# Patient Record
Sex: Male | Born: 1970
Health system: Southern US, Community
[De-identification: ages and names within clinical notes are randomized; demographics above are authoritative.]

## PROBLEM LIST (undated history)

## (undated) DIAGNOSIS — N390 Urinary tract infection, site not specified: Secondary | ICD-10-CM

## (undated) DIAGNOSIS — N419 Inflammatory disease of prostate, unspecified: Secondary | ICD-10-CM

## (undated) DIAGNOSIS — I7121 Aneurysm of the ascending aorta, without rupture: Secondary | ICD-10-CM

## (undated) DIAGNOSIS — I712 Thoracic aortic aneurysm, without rupture: Secondary | ICD-10-CM

## (undated) DIAGNOSIS — G4730 Sleep apnea, unspecified: Secondary | ICD-10-CM

## (undated) DIAGNOSIS — G459 Transient cerebral ischemic attack, unspecified: Secondary | ICD-10-CM

## (undated) DIAGNOSIS — I10 Essential (primary) hypertension: Secondary | ICD-10-CM

## (undated) DIAGNOSIS — E119 Type 2 diabetes mellitus without complications: Secondary | ICD-10-CM

## (undated) HISTORY — DX: Morbid (severe) obesity due to excess calories: E66.01

## (undated) HISTORY — DX: Urinary tract infection, site not specified: N39.0

## (undated) HISTORY — PX: HERNIA REPAIR: SHX51

## (undated) HISTORY — PX: WISDOM TOOTH EXTRACTION: SHX21

## (undated) HISTORY — PX: OTHER SURGICAL HISTORY: SHX169

## (undated) HISTORY — DX: Inflammatory disease of prostate, unspecified: N41.9

## (undated) HISTORY — DX: Essential (primary) hypertension: I10

---

## 2007-06-30 ENCOUNTER — Emergency Department (HOSPITAL_COMMUNITY): Admission: EM | Admit: 2007-06-30 | Discharge: 2007-06-30 | Payer: Self-pay | Admitting: Emergency Medicine

## 2007-10-17 ENCOUNTER — Emergency Department (HOSPITAL_COMMUNITY): Admission: EM | Admit: 2007-10-17 | Discharge: 2007-10-17 | Payer: Self-pay | Admitting: Emergency Medicine

## 2008-06-12 ENCOUNTER — Ambulatory Visit (HOSPITAL_COMMUNITY): Admission: RE | Admit: 2008-06-12 | Discharge: 2008-06-13 | Payer: Self-pay | Admitting: General Surgery

## 2008-06-12 ENCOUNTER — Encounter (HOSPITAL_BASED_OUTPATIENT_CLINIC_OR_DEPARTMENT_OTHER): Payer: Self-pay | Admitting: General Surgery

## 2008-12-01 ENCOUNTER — Emergency Department (HOSPITAL_COMMUNITY): Admission: EM | Admit: 2008-12-01 | Discharge: 2008-12-01 | Payer: Self-pay | Admitting: Family Medicine

## 2008-12-18 ENCOUNTER — Emergency Department (HOSPITAL_BASED_OUTPATIENT_CLINIC_OR_DEPARTMENT_OTHER): Admission: EM | Admit: 2008-12-18 | Discharge: 2008-12-18 | Payer: Self-pay | Admitting: Emergency Medicine

## 2009-01-17 ENCOUNTER — Encounter: Admission: RE | Admit: 2009-01-17 | Discharge: 2009-01-17 | Payer: Self-pay | Admitting: Orthopedic Surgery

## 2009-05-01 ENCOUNTER — Emergency Department (HOSPITAL_COMMUNITY): Admission: EM | Admit: 2009-05-01 | Discharge: 2009-05-01 | Payer: Self-pay | Admitting: Family Medicine

## 2009-08-29 ENCOUNTER — Emergency Department (HOSPITAL_COMMUNITY): Admission: EM | Admit: 2009-08-29 | Discharge: 2009-08-29 | Payer: Self-pay | Admitting: Emergency Medicine

## 2009-09-23 ENCOUNTER — Emergency Department (HOSPITAL_COMMUNITY): Admission: EM | Admit: 2009-09-23 | Discharge: 2009-09-23 | Payer: Self-pay | Admitting: Emergency Medicine

## 2009-10-18 ENCOUNTER — Encounter (INDEPENDENT_AMBULATORY_CARE_PROVIDER_SITE_OTHER): Payer: Self-pay | Admitting: *Deleted

## 2009-10-18 ENCOUNTER — Ambulatory Visit (HOSPITAL_COMMUNITY): Admission: RE | Admit: 2009-10-18 | Discharge: 2009-10-18 | Payer: Self-pay | Admitting: Urology

## 2009-11-13 ENCOUNTER — Emergency Department (HOSPITAL_COMMUNITY): Admission: EM | Admit: 2009-11-13 | Discharge: 2009-11-13 | Payer: Self-pay | Admitting: Family Medicine

## 2010-05-22 ENCOUNTER — Emergency Department (HOSPITAL_COMMUNITY): Admission: EM | Admit: 2010-05-22 | Discharge: 2010-05-22 | Payer: Self-pay | Admitting: Emergency Medicine

## 2010-06-10 ENCOUNTER — Ambulatory Visit: Payer: Self-pay | Admitting: Internal Medicine

## 2010-06-10 ENCOUNTER — Encounter: Payer: Self-pay | Admitting: Internal Medicine

## 2010-06-10 DIAGNOSIS — N410 Acute prostatitis: Secondary | ICD-10-CM | POA: Insufficient documentation

## 2010-06-10 DIAGNOSIS — K921 Melena: Secondary | ICD-10-CM | POA: Insufficient documentation

## 2010-06-10 DIAGNOSIS — E119 Type 2 diabetes mellitus without complications: Secondary | ICD-10-CM | POA: Insufficient documentation

## 2010-06-10 DIAGNOSIS — I10 Essential (primary) hypertension: Secondary | ICD-10-CM | POA: Insufficient documentation

## 2010-06-10 LAB — CONVERTED CEMR LAB
AST: 78 units/L — ABNORMAL HIGH (ref 0–37)
Alkaline Phosphatase: 81 units/L (ref 39–117)
Basophils Absolute: 0.1 10*3/uL (ref 0.0–0.1)
Bilirubin Urine: NEGATIVE
Calcium: 9.5 mg/dL (ref 8.4–10.5)
Cholesterol: 214 mg/dL — ABNORMAL HIGH (ref 0–200)
Creatinine, Ser: 0.9 mg/dL (ref 0.4–1.5)
Direct LDL: 152.4 mg/dL
Eosinophils Absolute: 0.2 10*3/uL (ref 0.0–0.7)
Glucose, Bld: 168 mg/dL — ABNORMAL HIGH (ref 70–99)
HCT: 40.2 % (ref 39.0–52.0)
HDL: 38.9 mg/dL — ABNORMAL LOW (ref >39.00)
MCHC: 33.3 g/dL (ref 30.0–36.0)
MCV: 86.7 fL (ref 78.0–100.0)
Monocytes Absolute: 0.8 10*3/uL (ref 0.1–1.0)
Platelets: 248 10*3/uL (ref 150.0–400.0)
Potassium: 4.1 meq/L (ref 3.5–5.1)
RDW: 13.9 % (ref 11.5–14.6)
Sodium: 137 meq/L (ref 135–145)
Specific Gravity, Urine: >=1.03 (ref 1.000–1.030)
Total Bilirubin: 0.5 mg/dL (ref 0.3–1.2)
Total CHOL/HDL Ratio: 6
Total Protein: 6.8 g/dL (ref 6.0–8.3)
VLDL: 28.4 mg/dL (ref 0.0–40.0)
pH: 5 (ref 5.0–8.0)

## 2010-06-11 ENCOUNTER — Encounter: Payer: Self-pay | Admitting: Internal Medicine

## 2010-06-14 ENCOUNTER — Ambulatory Visit: Payer: Self-pay | Admitting: Gastroenterology

## 2010-06-14 ENCOUNTER — Encounter (INDEPENDENT_AMBULATORY_CARE_PROVIDER_SITE_OTHER): Payer: Self-pay | Admitting: *Deleted

## 2010-07-18 ENCOUNTER — Encounter: Payer: Self-pay | Admitting: Gastroenterology

## 2010-07-18 ENCOUNTER — Ambulatory Visit (HOSPITAL_COMMUNITY)
Admission: RE | Admit: 2010-07-18 | Discharge: 2010-07-18 | Payer: Self-pay | Source: Home / Self Care | Attending: Gastroenterology | Admitting: Gastroenterology

## 2010-08-29 NOTE — Procedures (Signed)
Summary: Instructions for procedure/[place]  Instructions for procedure/[place]   Imported By: Sherian Rein 06/19/2010 10:17:08  _____________________________________________________________________  External Attachment:    Type:   Image     Comment:   External Document

## 2010-08-29 NOTE — Assessment & Plan Note (Signed)
Summary: New / [facility] / # cd   Vital Signs:  Patient profile:   40 year old male Height:      77 inches Weight:      423 pounds BMI:     50.34 O2 Sat:      97 % on Room air Temp:     97.8 degrees F oral Pulse rate:   81 / minute Pulse rhythm:   regular Resp:     16 per minute BP sitting:   142 / 94  (left arm) Cuff size:   large  Vitals Entered By: Rock Nephew CMA (June 10, 2010 4:07 PM)  Nutrition Counseling: Patient's BMI is greater than 25 and therefore counseled on weight management options.  O2 Flow:  Room air CC: new to establish Is Patient Diabetic? No Pain Assessment Patient in pain? no       Does patient need assistance? Functional Status Self care Ambulation Normal   Primary Care Provider:  Etta Grandchild MD  CC:  new to establish.  History of Present Illness: New to me he has been seen at an [facility] recently but no consistent primary care. He has had intermittent painless rectal bleeding for several months. He had a cystoscopy in April with Dr. Laverle Patter for prostatitis.  Preventive Screening-Counseling & Management  Alcohol-Tobacco     Alcohol drinks/day: <1     Alcohol type: beer     >5/day in last 3 mos: no     Alcohol Counseling: not indicated; use of alcohol is not excessive or problematic     Feels need to cut down: no     Feels annoyed by complaints: no     Feels guilty re: drinking: no     Needs 'eye opener' in am: no     Smoking Status: never     Tobacco Counseling: not indicated; no tobacco use  Caffeine-Diet-Exercise     Does Patient Exercise: yes  Hep-HIV-STD-Contraception     Hepatitis Risk: no risk noted     HIV Risk: no risk noted     STD Risk: no risk noted     Dental Visit-last 6 months yes     TSE monthly: yes      Sexual History:  currently monogamous.        Drug Use:  no.        Blood Transfusions:  no.    Clinical Review Panels:  Immunizations   Last Tetanus Booster:  Tdap (06/10/2010)   Last Flu  Vaccine:  Fluvax 3+ (06/10/2010)  Diabetes Management   Last Flu Vaccine:  Fluvax 3+ (06/10/2010)   Current Medications (verified): 1)  None  Allergies (verified): No Known Drug Allergies  Past History:  Past Medical History: Diabetes mellitus, type II Hypertension Prostatitis  Past Surgical History: Hemorrhoidectomy  Family History: Family History of Arthritis Family History Breast cancer 1st degree relative <50 Family History of Colon CA 1st degree relative <60 Family History Diabetes 1st degree relative Family History High cholesterol Family History Hypertension  Social History: Occupation: Geneticist, molecular Married Never Smoked Alcohol use-yes Drug use-no Regular exercise-yes Smoking Status:  never Drug Use:  no Does Patient Exercise:  yes Hepatitis Risk:  no risk noted HIV Risk:  no risk noted STD Risk:  no risk noted Dental Care w/in 6 mos.:  yes Sexual History:  currently monogamous Blood Transfusions:  no  Review of Systems       The patient complains of hematochezia.  The  patient denies anorexia, fever, weight loss, weight gain, chest pain, syncope, dyspnea on exertion, peripheral edema, prolonged cough, headaches, hemoptysis, abdominal pain, melena, severe indigestion/heartburn, hematuria, muscle weakness, suspicious skin lesions, difficulty walking, depression, enlarged lymph nodes, angioedema, and testicular masses.   GI:  Complains of bloody stools; denies abdominal pain, change in bowel habits, constipation, dark tarry stools, diarrhea, hemorrhoids, indigestion, loss of appetite, nausea, vomiting, vomiting blood, and yellowish skin color. GU:  Denies decreased libido, discharge, dysuria, erectile dysfunction, hematuria, nocturia, urinary frequency, and urinary hesitancy. Endo:  Complains of polyuria; denies cold intolerance, excessive hunger, excessive thirst, excessive urination, heat intolerance, and weight change.  Physical  Exam  General:  alert, well-developed, well-nourished, well-hydrated, appropriate dress, normal appearance, healthy-appearing, cooperative to examination, good hygiene, and overweight-appearing.   Head:  normocephalic, atraumatic, no abnormalities observed, and no abnormalities palpated.   Eyes:  vision grossly intact, pupils equal, pupils round, and pupils reactive to light.   Ears:  R ear normal and L ear normal.   Nose:  External nasal examination shows no deformity or inflammation. Nasal mucosa are pink and moist without lesions or exudates. Mouth:  Oral mucosa and oropharynx without lesions or exudates.  Teeth in good repair. Neck:  supple, full ROM, no masses, no thyromegaly, no thyroid nodules or tenderness, no JVD, normal carotid upstroke, no carotid bruits, no cervical lymphadenopathy, and no neck tenderness.   Lungs:  normal respiratory effort, no intercostal retractions, no accessory muscle use, normal breath sounds, no dullness, no fremitus, no crackles, and no wheezes.   Heart:  normal rate, regular rhythm, no murmur, no gallop, no rub, and no JVD.   Abdomen:  soft, non-tender, normal bowel sounds, no distention, no masses, no guarding, no rigidity, no rebound tenderness, no abdominal hernia, no inguinal hernia, no hepatomegaly, and no splenomegaly.   Rectal:  no external abnormalities, normal sphincter tone, no masses, no tenderness, no fissures, no fistulae, no perianal rash, stool positive for occult blood, and internal hemorrhoid(s).   Genitalia:  uncircumcised, no hydrocele, no varicocele, no scrotal masses, no testicular masses or atrophy, no cutaneous lesions, and no urethral discharge.   Prostate:  Prostate gland firm and smooth, no enlargement, nodularity, tenderness, mass, asymmetry or induration. Skin:  body piercing(s)-tongue. turgor normal, color normal, no rashes, no suspicious lesions, no ecchymoses, no petechiae, no purpura, no ulcerations, no edema, and body piercing(s).    Cervical Nodes:  no anterior cervical adenopathy and no posterior cervical adenopathy.   Axillary Nodes:  no R axillary adenopathy and no L axillary adenopathy.   Inguinal Nodes:  no R inguinal adenopathy and no L inguinal adenopathy.   Psych:  Oriented X3, memory intact for recent and remote, normally interactive, good eye contact, not anxious appearing, not depressed appearing, not agitated, not suicidal, and not homicidal.     Impression & Recommendations:  Problem # 1:  OBESITY, MORBID (ICD-278.01) Assessment New  Ht: 77 (06/10/2010)   Wt: 423 (06/10/2010)   BMI: 50.34 (06/10/2010)  Problem # 2:  BLOOD IN STOOL (ICD-578.1) Assessment: New with his family hx. he may need a colonoscopy Orders: Gastroenterology Referral (GI) Hemoccult Guaiac-1 spec.(in office) (82270)  Problem # 3:  PROSTATITIS, ACUTE (ICD-601.0) Assessment: Improved  Orders: Venipuncture ([address]) TLB-Lipid Panel (80061-LIPID) TLB-BMP (Basic Metabolic Panel-BMET) (80048-METABOL) TLB-CBC Platelet - w/Differential (85025-CBCD) TLB-Hepatic/Liver Function Pnl (80076-HEPATIC) TLB-TSH (Thyroid Stimulating Hormone) (84443-TSH) TLB-A1C / Hgb A1C (Glycohemoglobin) (83036-A1C) TLB-PSA (Prostate Specific Antigen) (84153-PSA) TLB-Udip w/ Micro (81001-URINE)  Problem # 4:  HYPERTENSION (ICD-401.9) Assessment:  Deteriorated he is not willing to take a med at this time Orders: Venipuncture ([address]) TLB-Lipid Panel (80061-LIPID) TLB-BMP (Basic Metabolic Panel-BMET) (80048-METABOL) TLB-CBC Platelet - w/Differential (85025-CBCD) TLB-Hepatic/Liver Function Pnl (80076-HEPATIC) TLB-TSH (Thyroid Stimulating Hormone) (84443-TSH) TLB-A1C / Hgb A1C (Glycohemoglobin) (83036-A1C) TLB-PSA (Prostate Specific Antigen) (84153-PSA)  BP today: 142/94  Problem # 5:  DIABETES MELLITUS, TYPE II (ICD-250.00) Assessment: Unchanged  Orders: Venipuncture (60[address]) TLB-Lipid Panel (80061-LIPID) TLB-BMP (Basic Metabolic Panel-BMET)  (80048-METABOL) TLB-CBC Platelet - w/Differential (85025-CBCD) TLB-Hepatic/Liver Function Pnl (80076-HEPATIC) TLB-TSH (Thyroid Stimulating Hormone) (84443-TSH) TLB-A1C / Hgb A1C (Glycohemoglobin) (83036-A1C) TLB-PSA (Prostate Specific Antigen) (84153-PSA)  Other Orders: Tdap => 2yrs IM (09811) Admin of Any Addtl Vaccine (91478) Future Orders: Admin 1st Vaccine (29562) ... 06/11/2010 Flu Vaccine 92yrs + (13086) ... 06/11/2010  Patient Instructions: 1)  Please schedule a follow-up appointment in 1 month. 2)  It is important that you exercise regularly at least 20 minutes 5 times a week. If you develop chest pain, have severe difficulty breathing, or feel very tired , stop exercising immediately and seek medical attention. 3)  You need to lose weight. Consider a lower calorie diet and regular exercise.  4)  Schedule a colonoscopy/sigmoidoscopy to help detect colon cancer. 5)  Check your Blood Pressure regularly. If it is above 140/90: you should make an appointment.   Orders Added: 1)  Venipuncture [36415] 2)  TLB-Lipid Panel [80061-LIPID] 3)  TLB-BMP (Basic Metabolic Panel-BMET) [80048-METABOL] 4)  TLB-CBC Platelet - w/Differential [85025-CBCD] 5)  TLB-Hepatic/Liver Function Pnl [80076-HEPATIC] 6)  TLB-TSH (Thyroid Stimulating Hormone) [84443-TSH] 7)  TLB-A1C / Hgb A1C (Glycohemoglobin) [83036-A1C] 8)  TLB-PSA (Prostate Specific Antigen) [84153-PSA] 9)  TLB-Udip w/ Micro [81001-URINE] 10)  Gastroenterology Referral [GI] 11)  Admin 1st Vaccine [90471] 12)  Flu Vaccine 47yrs + [57846] 13)  Tdap => 66yrs IM [90715] 14)  Admin of Any Addtl Vaccine [90472] 15)  Hemoccult Guaiac-1 spec.(in office) [82270] 16)  New Patient Level V [99205] Flu Vaccine Consent Questions     Do you have a history of severe allergic reactions to this vaccine? no    Any prior history of allergic reactions to egg and/or gelatin? no    Do you have a sensitivity to the preservative Thimersol? no    Do you have  a past history of Guillan-Barre Syndrome? no    Do you currently have an acute febrile illness? no    Have you ever had a severe reaction to latex? no    Vaccine information given and explained to patient? yes    Are you currently pregnant? no    Lot Number:[id number]   Exp Date:01/25/2011   Site Given  Left Deltoid IM 3)  TLB-BMP (Basic Metabolic Panel-BMET) [80048-METABOL] 4)  TLB-CBC Platelet - w/Differential [85025-CBCD] 5)  TLB-Hepatic/Liver Function Pnl [80076-HEPATIC] 6)  TLB-TSH (Thyroid Stimulating Hormone) [84443-TSH] 7)  TLB-A1C / Hgb A1C (Glycohemoglobin) [83036-A1C] 8)  TLB-PSA (Prostate Specific Antigen) [84153-PSA] 9)  TLB-Udip w/ Micro [81001-URINE] 10)  Gastroenterology Referral [GI] 11)  Admin 1st Vaccine [90471] 12)  Flu Vaccine 15yrs + [96295]   Immunizations Administered:  Tetanus Vaccine:    Vaccine Type: Tdap    Site: right deltoid    Mfr: GlaxoSmithKline    Dose: 0.5 ml    Route: IM    Given by: Rock Nephew CMA    Exp. Date: 05/16/2012    Lot #: [id number]    VIS given: 06/14/08 version given June 11, 2010.   Immunizations Administered:  Tetanus Vaccine:    Vaccine Type:  Tdap    Site: right deltoid    Mfr: GlaxoSmithKline    Dose: 0.5 ml    Route: IM    Given by: Rock Nephew CMA    Exp. Date: 05/16/2012    Lot #: [id number]    VIS given: 06/14/08 version given June 11, 2010.   Marland Kitchenlbflu1

## 2010-08-29 NOTE — Letter (Signed)
Summary: Lipid Letter  [facility]  [address] [place], [place] [address]   Phone: [phone]  Fax: [phone]    06/11/2010  Stephen Dunlap [address] [place], [place]  [address]  Dear [name]:  We have carefully reviewed your last lipid profile from  and the results are noted below with a summary of recommendations for lipid management.    Cholesterol:       214     Goal: <200   HDL "good" Cholesterol:   95.28     Goal: >40   LDL "bad" Cholesterol:   152     Goal: <100   Triglycerides:       142.0     Goal: <150        TLC Diet (Therapeutic Lifestyle Change): Saturated Fats & Transfatty acids should be kept < 7% of total calories ***Reduce Saturated Fats Polyunstaurated Fat can be up to 10% of total calories Monounsaturated Fat Fat can be up to 20% of total calories Total Fat should be no greater than 25-35% of total calories Carbohydrates should be 50-60% of total calories Protein should be approximately 15% of total calories Fiber should be at least 20-30 grams a day ***Increased fiber may help lower LDL Total Cholesterol should be < 200mg /day Consider adding plant stanol/sterols to diet (example: Benacol spread) ***A higher intake of unsaturated fat may reduce Triglycerides and Increase HDL    Adjunctive Measures (may lower LIPIDS and reduce risk of Heart Attack) include: Aerobic Exercise (20-30 minutes 3-4 times a week) Limit Alcohol Consumption Weight Reduction Aspirin 75-81 mg a day by mouth (if not allergic or contraindicated) Dietary Fiber 20-30 grams a day by mouth     Current Medications:  None If you have any questions, please call. We appreciate being able to work with you.   Sincerely,    [facility] Etta Grandchild MD

## 2010-08-29 NOTE — Letter (Signed)
Summary: New Patient letter  [facility]  [address] [place], [place] [address]   Phone: [phone]  Fax: [phone]       06/10/2010 MRN: [id number]  Stephen Dunlap [address] [place], [place]  [address]  Dear Stephen Dunlap,  Welcome to the Gastroenterology Division at [facility].    You are scheduled to see Dr.  Marina Goodell on 06-17-10 at 3:30pm on the 3rd floor at [facility], [address]  We ask that you try to arrive at our office 15 minutes prior to your appointment time to allow for check-in.  We would like you to complete the enclosed self-administered evaluation form prior to your visit and bring it with you on the day of your appointment.  We will review it with you.  Also, please bring a complete list of all your medications or, if you prefer, bring the medication bottles and we will list them.  Please bring your insurance card so that we may make a copy of it.  If your insurance requires a referral to see a specialist, please bring your referral form from your primary care physician.  Co-payments are due at the time of your visit and may be paid by cash, check or credit card.     Your office visit will consist of a consult with your physician (includes a physical exam), any laboratory testing he/she may order, scheduling of any necessary diagnostic testing (e.g. x-ray, ultrasound, CT-scan), and scheduling of a procedure (e.g. Endoscopy, Colonoscopy) if required.  Please allow enough time on your schedule to allow for any/all of these possibilities.    If you cannot keep your appointment, please call [phone] to cancel or reschedule prior to your appointment date.  This allows [place] the opportunity to schedule an appointment for another patient in need of care.  If you do not cancel or reschedule by 5 p.m. the business day prior to your appointment date, you will be charged a $50.00 late cancellation/no-show fee.    Thank you for choosing  [facility] for your medical needs.  We appreciate the opportunity to care for you.  Please visit [place] at our website  to learn more about our practice.                     Sincerely,                                                             The Gastroenterology Division

## 2010-08-29 NOTE — Letter (Signed)
Summary: [facility] Instructions  [facility]  [address] [place], [place] [address]   Phone: [phone]  Fax: [phone]       Stephen Dunlap    Feb 05, 1971    MRN: [id number]        Procedure Day /Date:07/18/10 THURS     Arrival Time:600 am     Procedure Time:800 am     Location of Procedure:                     X  [facility] ( Outpatient Registration)                        PREPARATION FOR COLONOSCOPY WITH MOVIPREP   Starting 5 days prior to your procedure 07/13/10  do not eat nuts, seeds, popcorn, corn, beans, peas,  salads, or any raw vegetables.  Do not take any fiber supplements (e.g. Metamucil, Citrucel, and Benefiber).  THE DAY BEFORE YOUR PROCEDURE         DATE:07/17/10  DAY:WED  1.  Drink clear liquids the entire day-NO SOLID FOOD  2.  Do not drink anything colored red or purple.  Avoid juices with pulp.  No orange juice.  3.  Drink at least 64 oz. (8 glasses) of fluid/clear liquids during the day to prevent dehydration and help the prep work efficiently.  CLEAR LIQUIDS INCLUDE: Water Jello Ice Popsicles Tea (sugar ok, no milk/cream) Powdered fruit flavored drinks Coffee (sugar ok, no milk/cream) Gatorade Juice: apple, white grape, white cranberry  Lemonade Clear bullion, consomm, broth Carbonated beverages (any kind) Strained chicken noodle soup Hard Candy                             4.  In the morning, mix first dose of MoviPrep solution:    Empty 1 Pouch A and 1 Pouch B into the disposable container    Add lukewarm drinking water to the top line of the container. Mix to dissolve    Refrigerate (mixed solution should be used within 24 hrs)  5.  Begin drinking the prep at 5:00 p.m. The MoviPrep container is divided by 4 marks.   Every 15 minutes drink the solution down to the next Mylin (approximately 8 oz) until the full liter is complete.   6.  Follow completed prep with 16 oz of clear liquid of your choice (Nothing  red or purple).  Continue to drink clear liquids until bedtime.  7.  Before going to bed, mix second dose of MoviPrep solution:    Empty 1 Pouch A and 1 Pouch B into the disposable container    Add lukewarm drinking water to the top line of the container. Mix to dissolve    Refrigerate  THE DAY OF YOUR PROCEDURE      DATE: 07/18/10 DAY: Stephen Dunlap  Beginning at 3 a.m. (5 hours before procedure):         1. Every 15 minutes, drink the solution down to the next Deloy (approx 8 oz) until the full liter is complete.  Nothing to eat or drink after midnight (also includes chewing gum)   MEDICATION INSTRUCTIONS  Unless otherwise instructed, you should take regular prescription medications with a small sip of water   as early as possible the morning of your procedure.  Diabetic patients - see separate instructions.            OTHER  INSTRUCTIONS  You will need a responsible adult at least 39 years of age to accompany you and drive you home.   This person must remain in the waiting room during your procedure.  Wear loose fitting clothing that is easily removed.  Leave jewelry and other valuables at home.  However, you may wish to bring a book to read or  an iPod/MP3 player to listen to music as you wait for your procedure to start.  Remove all body piercing jewelry and leave at home.  Total time from sign-in until discharge is approximately 2-3 hours.  You should go home directly after your procedure and rest.  You can resume normal activities the  day after your procedure.  The day of your procedure you should not:   Drive   Make legal decisions   Operate machinery   Drink alcohol   Return to work  You will receive specific instructions about eating, activities and medications before you leave.    The above instructions have been reviewed and explained to me by   _______________________    I fully understand and can verbalize these instructions  _____________________________ Date _________

## 2010-08-29 NOTE — Letter (Signed)
Summary: Results Follow-up Letter  [facility]  [address] [place], [place] [address]   Phone: [phone]  Fax: [phone]    06/11/2010  [address] [place], [place]  [address]  Dear Stephen Dunlap,   The following are the results of your recent test(s):  Test     Result     Blood sugars   high, you need meds for diabetes Liver       very slight enzyme elevation Kidney     normal Thyroid     normal Urine       trace of infection CBC       normal Prostate     normal   _________________________________________________________  Please call for an appointment soon _________________________________________________________ _________________________________________________________ _________________________________________________________  Sincerely,  Sanda Linger MD [facility]

## 2010-08-29 NOTE — Letter (Signed)
Summary: Diabetic Instructions  [facility]  [address] [place], [place] [address]   Phone: [phone]  Fax: [phone]    Stephen Dunlap [birth year] MRN: [id number]   X   ORAL DIABETIC MEDICATION INSTRUCTIONS  The day before your procedure:   Take your diabetic pill as you do normally  The day of your procedure:   Do not take your diabetic pill    We will check your blood sugar levels during the admission process and again in Recovery before discharging you home  ________________________________________________________________________  X   INSULIN (LONG ACTING) MEDICATION INSTRUCTIONS (Lantus, NPH, 70/30, Humulin, Novolin-N)   The day before your procedure:   Take  your regular evening dose    The day of your procedure:   Do not take your morning dose    X   INSULIN (SHORT ACTING) MEDICATION INSTRUCTIONS (Regular, Humulog, Novolog)   The day before your procedure:   Do not take your evening dose   The day of your procedure:   Do not take your morning dose   Please arrive at [facility] at 1:30 pm on 07/17/10 for pre appointment

## 2010-08-29 NOTE — Procedures (Signed)
Summary: Colonoscopy  Patient: Stephen Dunlap Note: All result statuses are Final unless otherwise noted.  Tests: (1) Colonoscopy (COL)   COL Colonoscopy           DONE     [facility]     [address] [place], [place]  [address]           COLONOSCOPY PROCEDURE REPORT           PATIENT:  Stephen Dunlap, Stephen Dunlap  MR#:  [id number]     BIRTHDATE:  12/25/1970, 39 yrs. old  GENDER:  male     ENDOSCOPIST:  Rachael Fee, MD     REF. BY:  Etta Grandchild, M.D.     PROCEDURE DATE:  07/18/2010     PROCEDURE:  Diagnostic Colonoscopy     ASA CLASS:  Class II     INDICATIONS:  minor intermittent rectal bleeding     MEDICATIONS:  MAC sedation, administered by CRNA           DESCRIPTION OF PROCEDURE:   After the risks benefits and     alternatives of the procedure were thoroughly explained, informed     consent was obtained.  No rectal exam performed. The EC-[id number]     ([id number]) endoscope was introduced through the anus and advanced     to the cecum, which was identified by both the appendix and     ileocecal valve, without limitations.  The quality of the prep was     excellent, using MoviPrep.  The instrument was then slowly     withdrawn as the colon was fully examined.     <<PROCEDUREIMAGES>>     FINDINGS:  Internal and external hemorrhoids were found. These     were small, not thrombosed.  This was otherwise a normal     examination of the colon (see image002, image003, and image005).     Retroflexed views in the rectum revealed no abnormalities.    The     scope was then withdrawn from the patient and the procedure     completed.     COMPLICATIONS:  None           ENDOSCOPIC IMPRESSION:     1)Small internal and external hemorrhoids     2) Otherwise normal examination; no polyps or cancers           RECOMMENDATIONS:     1) You should continue to follow colorectal cancer screening     guidelines for "routine risk" patients with a repeat colonoscopy     in 10 years. There is  no need for FOBT (stool) testing for at     least 5 years.           REPEAT EXAM:  10 years           ______________________________     Rachael Fee, MD           n.     eSIGNED:   Rachael Fee at 07/18/2010 08:47 AM           Lendon Collar, [id number]  Note: An exclamation Cordarro (!) indicates a result that was not dispersed into the flowsheet. Document Creation Date: 07/18/2010 8:47 AM _______________________________________________________________________  (1) Order result status: Final Collection or observation date-time: 07/18/2010 08:39 Requested date-time:  Receipt date-time:  Reported date-time:  Referring Physician:   Ordering Physician: Rob Bunting [phone]) Specimen Source:  Source: Launa Grill Order Number: [id number] Lab  site:   Appended Document: Colonoscopy patty, he needs recall colonoscopy in 10 years  Appended Document: Colonoscopy recall in IDX and EMR   Clinical Lists Changes  Observations: Added new observation of COLONNXTDUE: 04/2020 (07/18/2010 9:27)

## 2010-08-29 NOTE — Assessment & Plan Note (Signed)
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  who has had intermittent rectal bleeding.  Noticed in many years ago. Bright red blood streaking the stool.  Can have minor constipation at times.  he had a CBC last week showing that he is not anemic. He does not have any rectal discomfort.           Current Medications (verified): 1)  None  Allergies (verified): No Known Drug Allergies  Past History:  Past Medical History: Diabetes mellitus, type II Hypertension Prostatitis morbid obesity UTI  Past Surgical History: hernia repair cysto bedside  Family History: Family History of Arthritis Family History Breast cancer 1st degree relative <50 Family History Diabetes 1st degree relative Family History High cholesterol Family History Hypertension  Social History: Occupation: Geneticist, molecular Married Never Smoked Alcohol use-yes Drug use-no Regular exercise-yes three children  Review of Systems       Pertinent positive and negative review of systems were noted in the above HPI and GI specific review of systems.  All other review of systems was otherwise negative.   Vital Signs:  Patient profile:   40 year old male Height:      77 inches Weight:      423 pounds BMI:     50.34 BSA:     3.08 Pulse rate:   86 / minute Pulse rhythm:   regular BP sitting:   142 / 80  (left arm) Cuff size:   large  Vitals Entered By: Ok Anis CMA (June 14, 2010 1:39 PM)  Physical Exam  Additional Exam:  Constitutional: generally well appearing Psychiatric: alert and oriented times 3 Eyes: extraocular movements intact Mouth: oropharynx moist, no lesions Neck: supple, no lymphadenopathy Cardiovascular:  heart regular rate and rythm Lungs: CTA bilaterally Abdomen: soft, non-tender, non-distended, no obvious ascites, no peritoneal signs, normal bowel sounds Extremities: no lower extremity edema bilaterally Skin: no lesions on visible extremities  rectal exam deferred for upcoming colonoscopy   Impression & Recommendations:  Problem # 1:  Intermittent, minor rectal bleeding likely anorectal in origin his aunt had colon cancer. We should proceed with colonoscopy at his soonest convenience. He has been a bit constipated lately and so I recommended a trial of fiber supplement.  Patient Instructions: 1)  You will be scheduled to have a colonoscopy at [facility] hosp with propofol sedation. 2)  You should begin taking citrucel powder fiber supplement (orange flavor).  Start with a small spoonful and increase this over 1 week to a full, heaping spoonful daily.  You may notice some bloating when you first start the fiber, but that usually resolves after a few days. 3)  The medication list was reviewed and reconciled.  All changed / newly prescribed medications were explained.  A complete medication list was provided to the patient / caregiver.  Appended Document: Orders Update/movi    Clinical Lists Changes  Medications: Added new medication of MOVIPREP 100 GM  SOLR (PEG-KCL-NACL-NASULF-NA ASC-C) As per prep instructions. - Signed Rx of MOVIPREP 100 GM  SOLR (PEG-KCL-NACL-NASULF-NA ASC-C) As per prep instructions.;  #1 x 0;  Signed;  Entered by: Chales Abrahams CMA (AAMA);  Authorized by: Rachael Fee MD;  Method used: Electronically to [facility]  [address] [phone]*, [address] [place], [place], [place]  [id number], Ph: [phone] or [phone], Fax: [phone] Orders: Added new Test order of Colonoscopy (Colon) - Signed    Prescriptions: MOVIPREP 100 GM  SOLR (PEG-KCL-NACL-NASULF-NA ASC-C) As per prep instructions.  #1 x 0   Entered by:   Chales Abrahams CMA (AAMA)   Authorized by:    Rachael Fee MD   Signed by:   Chales Abrahams CMA (AAMA) on 06/14/2010   Method used:   [facility] to        [facility]  [address] [phone]* (retail)       [address]       [place], [place]  [id number]       Ph: [phone] or [phone]       Fax: [phone]   RxID:   [id number]

## 2010-10-07 LAB — BASIC METABOLIC PANEL
BUN: 11 mg/dL (ref 6–23)
Chloride: 104 mEq/L (ref 96–112)
Creatinine, Ser: 0.94 mg/dL (ref 0.4–1.5)
GFR calc Af Amer: >60 mL/min (ref >60)
GFR calc non Af Amer: >60 mL/min (ref >60)
Potassium: 4 mEq/L (ref 3.5–5.1)

## 2010-10-16 LAB — URINALYSIS, ROUTINE W REFLEX MICROSCOPIC
Glucose, UA: NEGATIVE mg/dL
Ketones, ur: NEGATIVE mg/dL
Protein, ur: 30 mg/dL — AB
Urobilinogen, UA: 0.2 mg/dL (ref 0.0–1.0)

## 2010-10-16 LAB — URINE MICROSCOPIC-ADD ON

## 2010-10-16 LAB — URINE CULTURE

## 2010-10-22 ENCOUNTER — Other Ambulatory Visit (INDEPENDENT_AMBULATORY_CARE_PROVIDER_SITE_OTHER): Payer: 59 | Admitting: Internal Medicine

## 2010-10-22 ENCOUNTER — Other Ambulatory Visit (INDEPENDENT_AMBULATORY_CARE_PROVIDER_SITE_OTHER): Payer: 59

## 2010-10-22 ENCOUNTER — Telehealth: Payer: Self-pay | Admitting: Internal Medicine

## 2010-10-22 ENCOUNTER — Encounter: Payer: Self-pay | Admitting: Internal Medicine

## 2010-10-22 ENCOUNTER — Ambulatory Visit (INDEPENDENT_AMBULATORY_CARE_PROVIDER_SITE_OTHER)
Admission: RE | Admit: 2010-10-22 | Discharge: 2010-10-22 | Disposition: A | Discharge status: Home / Self Care | Payer: 59 | Source: Ambulatory Visit | Attending: Internal Medicine | Admitting: Internal Medicine

## 2010-10-22 ENCOUNTER — Ambulatory Visit (INDEPENDENT_AMBULATORY_CARE_PROVIDER_SITE_OTHER): Payer: 59 | Admitting: Internal Medicine

## 2010-10-22 VITALS — BP 136/88 | HR 96 | Temp 99.4°F | Resp 22 | Wt >= 6400 oz

## 2010-10-22 DIAGNOSIS — I10 Essential (primary) hypertension: Secondary | ICD-10-CM

## 2010-10-22 DIAGNOSIS — E118 Type 2 diabetes mellitus with unspecified complications: Secondary | ICD-10-CM | POA: Insufficient documentation

## 2010-10-22 DIAGNOSIS — J209 Acute bronchitis, unspecified: Secondary | ICD-10-CM

## 2010-10-22 DIAGNOSIS — R7402 Elevation of levels of lactic acid dehydrogenase (LDH): Secondary | ICD-10-CM

## 2010-10-22 DIAGNOSIS — R05 Cough: Secondary | ICD-10-CM

## 2010-10-22 DIAGNOSIS — R059 Cough, unspecified: Secondary | ICD-10-CM | POA: Insufficient documentation

## 2010-10-22 DIAGNOSIS — R7401 Elevation of levels of liver transaminase levels: Secondary | ICD-10-CM

## 2010-10-22 DIAGNOSIS — E119 Type 2 diabetes mellitus without complications: Secondary | ICD-10-CM

## 2010-10-22 DIAGNOSIS — R740 Nonspecific elevation of levels of transaminase and lactic acid dehydrogenase [LDH]: Secondary | ICD-10-CM

## 2010-10-22 DIAGNOSIS — E669 Obesity, unspecified: Secondary | ICD-10-CM

## 2010-10-22 DIAGNOSIS — E785 Hyperlipidemia, unspecified: Secondary | ICD-10-CM

## 2010-10-22 DIAGNOSIS — E1169 Type 2 diabetes mellitus with other specified complication: Secondary | ICD-10-CM

## 2010-10-22 LAB — CBC WITH DIFFERENTIAL/PLATELET
Eosinophils Absolute: 0.1 10*3/uL (ref 0.0–0.7)
Eosinophils Relative: 1.3 % (ref 0.0–5.0)
HCT: 42.6 % (ref 39.0–52.0)
Hemoglobin: 14.4 g/dL (ref 13.0–17.0)
Lymphs Abs: 1.8 10*3/uL (ref 0.7–4.0)
MCHC: 33.8 g/dL (ref 30.0–36.0)
MCV: 84.7 fl (ref 78.0–100.0)
Monocytes Absolute: 0.8 10*3/uL (ref 0.1–1.0)
Platelets: 208 10*3/uL (ref 150.0–400.0)
RBC: 5.03 Mil/uL (ref 4.22–5.81)
RDW: 13.5 % (ref 11.5–14.6)

## 2010-10-22 LAB — LIPID PANEL
Cholesterol: 204 mg/dL — ABNORMAL HIGH (ref 0–200)
Total CHOL/HDL Ratio: 5
Triglycerides: 171 mg/dL — ABNORMAL HIGH (ref 0.0–149.0)
VLDL: 34.2 mg/dL (ref 0.0–40.0)

## 2010-10-22 LAB — COMPREHENSIVE METABOLIC PANEL
ALT: 36 U/L (ref 0–53)
AST: 28 U/L (ref 0–37)
Alkaline Phosphatase: 73 U/L (ref 39–117)
BUN: 9 mg/dL (ref 6–23)
Creatinine, Ser: 1 mg/dL (ref 0.4–1.5)
Glucose, Bld: 148 mg/dL — ABNORMAL HIGH (ref 70–99)
Potassium: 4.4 mEq/L (ref 3.5–5.1)
Sodium: 140 mEq/L (ref 135–145)
Total Bilirubin: 0.2 mg/dL — ABNORMAL LOW (ref 0.3–1.2)

## 2010-10-22 LAB — URINALYSIS, ROUTINE W REFLEX MICROSCOPIC
Bilirubin Urine: NEGATIVE
Ketones, ur: NEGATIVE
Leukocytes, UA: NEGATIVE
Specific Gravity, Urine: >=1.03 (ref 1.000–1.030)
Urine Glucose: NEGATIVE

## 2010-10-22 LAB — HEMOGLOBIN A1C: Hgb A1c MFr Bld: 8.2 % — ABNORMAL HIGH (ref 4.6–6.5)

## 2010-10-22 LAB — TSH: TSH: 0.97 u[IU]/mL (ref 0.35–5.50)

## 2010-10-22 LAB — LDL CHOLESTEROL, DIRECT: Direct LDL: 145.8 mg/dL

## 2010-10-22 MED ORDER — MOXIFLOXACIN HCL 400 MG PO TABS: 400.0000 mg | ORAL_TABLET | Freq: Every day | ORAL | Status: AC

## 2010-10-22 MED ORDER — PSEUDOEPH-CHLORPHEN-HYDROCOD 60-4-5 MG/5ML PO SOLN: 5.0000 mL | Freq: Four times a day (QID) | ORAL | Status: DC | PRN

## 2010-10-22 NOTE — Assessment & Plan Note (Signed)
Will recheck A1C and renal function, I praised him for lifestyle changes and d/w the need for meds (possibly)

## 2010-10-22 NOTE — Progress Notes (Signed)
Subjective:    Patient ID: Stephen Dunlap, male    DOB: Aug 08, 1970, 40 y.o.   MRN: [id number]  Cough This is a new problem. The current episode started in the past 7 days. The problem has been waxing and waning. The problem occurs every few hours. The cough is productive of purulent sputum. Associated symptoms include chills, a fever, myalgias and a sore throat. Pertinent negatives include no chest pain, ear congestion, ear pain, eye redness, headaches, heartburn, hemoptysis, nasal congestion, postnasal drip, rash, rhinorrhea, shortness of breath, weight loss or wheezing. The symptoms are aggravated by nothing. He has tried nothing for the symptoms. There is no history of asthma, bronchiectasis, bronchitis, COPD, emphysema, environmental allergies or pneumonia.      Review of Systems  Constitutional: Positive for fever and chills. Negative for weight loss, diaphoresis, appetite change, fatigue and unexpected weight change.  HENT: Positive for sore throat. Negative for ear pain, facial swelling, rhinorrhea, neck pain, neck stiffness, postnasal drip and ear discharge.   Eyes: Negative for pain, discharge and redness.  Respiratory: Positive for cough. Negative for apnea, hemoptysis, choking, chest tightness, shortness of breath, wheezing and stridor.   Cardiovascular: Negative for chest pain, palpitations and leg swelling.  Gastrointestinal: Negative for heartburn, nausea, vomiting, diarrhea, constipation and abdominal distention.  Genitourinary: Negative for dysuria, urgency, frequency, hematuria and decreased urine volume.  Musculoskeletal: Positive for myalgias.  Skin: Negative for rash.  Neurological: Negative for dizziness, weakness, numbness and headaches.  Hematological: Negative for environmental allergies and adenopathy. Does not bruise/bleed easily.  Psychiatric/Behavioral: Negative for hallucinations, behavioral problems, confusion, dysphoric mood, decreased concentration and agitation.     Lab Results  Component Value Date   WBC 10.3 06/10/2010   HGB 13.4 06/10/2010   HGB NEGATIVE 06/10/2010   HCT 40.2 06/10/2010   PLT 248.0 06/10/2010   CHOL 214* 06/10/2010   TRIG 142.0 06/10/2010   HDL 38.90* 06/10/2010   LDLDIRECT 152.4 06/10/2010   ALT 71* 06/10/2010   AST 78* 06/10/2010   NA 137 07/17/2010   K 4.0 07/17/2010   CL 104 07/17/2010   CREATININE 0.94 07/17/2010   BUN 11 07/17/2010   CO2 28 07/17/2010   TSH 2.24 06/10/2010   PSA 0.32 06/10/2010   HGBA1C 8.7* 06/10/2010      Objective:   Physical Exam  Constitutional: He appears well-developed and well-nourished. No distress.  HENT:  Head: Normocephalic and atraumatic.  Right Ear: External ear normal.  Left Ear: External ear normal.  Nose: Nose normal.  Mouth/Throat: Oropharynx is clear and moist. Mucous membranes are not pale, not dry and not cyanotic. Normal dentition. No oropharyngeal exudate (tongue is pierced), posterior oropharyngeal edema, posterior oropharyngeal erythema or tonsillar abscesses.  Eyes: Conjunctivae and EOM are normal. Pupils are equal, round, and reactive to light. Left eye exhibits no discharge. No scleral icterus.  Neck: Normal range of motion. Neck supple. No thyromegaly present.  Cardiovascular: Normal rate, regular rhythm, normal heart sounds and intact distal pulses.  Exam reveals no gallop and no friction rub.   No murmur heard. Pulmonary/Chest: Breath sounds normal. No respiratory distress. He has no wheezes. He has no rales. He exhibits no tenderness.  Abdominal: Soft. Bowel sounds are normal. He exhibits no distension and no mass. There is no tenderness. There is no rebound and no guarding.  Musculoskeletal: Normal range of motion. He exhibits no edema and no tenderness.  Lymphadenopathy:    He has no cervical adenopathy.  Neurological: He is alert. He has  normal reflexes. No cranial nerve deficit.  Skin: Skin is warm and dry. No rash noted. He is not diaphoretic. No  erythema. No pallor.  Psychiatric: He has a normal mood and affect. His behavior is normal. Judgment and thought content normal.          Assessment & Plan:

## 2010-10-22 NOTE — Assessment & Plan Note (Signed)
Start avelox for infection and symptom relief with zutripro as well

## 2010-10-22 NOTE — Patient Instructions (Signed)
Bronchitis Bronchitis is the body's way of reacting to injury and/or infection (inflammation) of the bronchi. Bronchi are the air tubes that extend from the windpipe into the lungs. If the inflammation becomes severe, it may cause shortness of breath.  CAUSES Inflammation may be caused by:  A virus.   Germs (bacteria).   Dust.   Allergens.   Pollutants and many other irritants.  The cells lining the bronchial tree are covered with tiny hairs (cilia). These constantly beat upward, away from the lungs, toward the mouth. This keeps the lungs free of pollutants. When these cells become too irritated and are unable to do their job, mucus begins to develop. This causes the characteristic cough of bronchitis. The cough clears the lungs when the cilia are unable to do their job. Without either of these protective mechanisms, the mucus would settle in the lungs. Then you would develop pneumonia. Smoking is a common cause of bronchitis and can contribute to pneumonia. Stopping this habit is the single most important thing you can do to help yourself. TREATMENT  Your caregiver may prescribe an antibiotic if the cough is caused by bacteria. Also, medicines that open up your airways make it easier to breathe. Your caregiver may also recommend or prescribe an expectorant. It will loosen the mucus to be coughed up. Only take over-the-counter or prescription medicines for pain, discomfort, or fever as directed by your caregiver.   Removing whatever causes the problem (smoking, for example) is critical to preventing the problem from getting worse.   Cough suppressants may be prescribed for relief of cough symptoms.   Inhaled medicines may be prescribed to help with symptoms now and to help prevent problems from returning.   For those with recurrent (chronic) bronchitis, there may be a need for steroid medicines.  SEEK IMMEDIATE MEDICAL CARE IF:  During treatment, you develop more pus-like mucus  (purulent sputum).   You or your child has an oral temperature above 100.5, not controlled by medicine.   Your baby is older than 3 months with a rectal temperature of 102 F (38.9 C) or higher.   Your baby is 32 months old or younger with a rectal temperature of 100.4 F (38 C) or higher.   You become progressively more ill.   You have increased difficulty breathing, wheezing, or shortness of breath.  It is necessary to seek immediate medical care if you are elderly or sick from any other disease. MAKE SURE YOU:  Understand these instructions.   Will watch your condition.   Will get help right away if you are not doing well or get worse.  Document Released: 07/14/2005 Document Re-Released: 10/08/2009 [facility] Patient Information 2011 [place], [place].Diabetes, Type 2 Diabetes is a lasting (chronic) disease. In type 2 diabetes, the pancreas does not make enough insulin (a hormone), and the body does not respond normally to the insulin that is made. This type of diabetes was also previously called adult onset diabetes. About 90% of all those who have diabetes have type 2. It usually occurs after the age of 4 but can occur at any age. CAUSES Unlike type 1 diabetes, which happens because insulin is no longer being made, type 2 diabetes happens because the body is making less insulin and has trouble using the insulin properly. SYMPTOMS  Drinking more than usual.   Urinating more than usual.   Blurred vision.   Dry, itchy skin.   Frequent infection like yeast infections in women.   More tired than usual (  fatigue).  TREATMENT  Healthy eating.   Exercise.   Medication, if needed.   Monitoring blood glucose (sugar).   Seeing your caregiver regularly.  HOME CARE INSTRUCTIONS  Check your blood glucose (sugar) at least once daily. More frequent monitoring may be necessary, depending on your medications and on how well your diabetes is controlled. Your caregiver will advise  you.   Take your medicine as directed by your caregiver.   Do not smoke.   Make wise food choices. Ask your caregiver for information. Weight loss can improve your diabetes.   Learn about low blood glucose (hypoglycemia) and how to treat it.   Get your eyes checked regularly.   Have a yearly physical exam. Have your blood pressure checked. Get your blood and urine tested.   Wear a pendant or bracelet saying that you have diabetes.   Check your feet every night for sores. Let your caregiver know if you have sores that are not healing.  SEEK MEDICAL CARE IF:  You are having problems keeping your blood glucose at target range.   You feel you might be having problems with your medicines.   You have symptoms of an illness that is not improving after 24 hours.   You have a sore or wound that is not healing.   You notice a change in vision or a new problem with your vision.   You develop a fever of more than 100.5.  Document Released: 07/14/2005 Document Re-Released: 08/05/2009 [facility] Patient Information 2011 [place], [place].

## 2010-10-22 NOTE — Assessment & Plan Note (Signed)
I think this is fatty liver dz., will recheck LFT's today

## 2010-10-22 NOTE — Telephone Encounter (Signed)
Please call him

## 2010-10-22 NOTE — Assessment & Plan Note (Signed)
Will check a cxr for pna, edema, masses

## 2010-10-22 NOTE — Assessment & Plan Note (Signed)
This has improved with lifestyle modifications, continue same

## 2010-10-23 ENCOUNTER — Telehealth: Payer: Self-pay | Admitting: Internal Medicine

## 2010-10-23 ENCOUNTER — Encounter: Payer: Self-pay | Admitting: Internal Medicine

## 2010-10-23 NOTE — Telephone Encounter (Signed)
Patient notified per MD, he will continue antibiotic as directed and follow up in a few days

## 2010-10-23 NOTE — Telephone Encounter (Signed)
Called patient on home/cell number and received a voice mail stating that mail box is full/can not leave a messg. Will try to reach patient later.

## 2010-10-23 NOTE — Telephone Encounter (Signed)
Returned call back to home and alt phone numbers provided// LMOVM stating that work note is ready for pick up.

## 2010-10-23 NOTE — Telephone Encounter (Signed)
thanks

## 2010-10-23 NOTE — Telephone Encounter (Signed)
done

## 2010-10-23 NOTE — Telephone Encounter (Signed)
Caller states that Pt was seen recently for "outpatient pneumonia" and is not getting any better; she states "that is one thing and she would like to speak w/you".  Caller also states that Pt needs a letter for work and to call when that is ready.

## 2010-10-25 ENCOUNTER — Telehealth: Payer: Self-pay | Admitting: *Deleted

## 2010-10-25 NOTE — Telephone Encounter (Signed)
Scheduled for sat clinic

## 2010-10-25 NOTE — Telephone Encounter (Signed)
Pt's wife called, pt has pneumonia and is not feeling any better. Wants to know if MD would see pt again today or prescribe alt med?

## 2010-10-25 NOTE — Telephone Encounter (Signed)
He needs to be seen

## 2010-10-26 ENCOUNTER — Ambulatory Visit (INDEPENDENT_AMBULATORY_CARE_PROVIDER_SITE_OTHER): Payer: 59 | Admitting: Family Medicine

## 2010-10-26 ENCOUNTER — Encounter: Payer: Self-pay | Admitting: Family Medicine

## 2010-10-26 DIAGNOSIS — J189 Pneumonia, unspecified organism: Secondary | ICD-10-CM | POA: Insufficient documentation

## 2010-10-26 MED ORDER — HYDROCOD POLST-CHLORPHEN POLST 10-8 MG/5ML PO LQCR: 5.0000 mL | Freq: Every evening | ORAL | Status: DC | PRN

## 2010-10-26 NOTE — Assessment & Plan Note (Signed)
Improving on antibiotics. No further fever, cough improved. No wheezing in lung... Pt noting upper airway noise from congestion.  Discussed with pt... Expected course of infection...give antibiotic more time. Will change to cough suppressant that has been more helpful in past for pt.  Continue rest and hydration.

## 2010-10-26 NOTE — Progress Notes (Signed)
  Subjective:    Patient ID: Stephen Dunlap, male    DOB: 12-15-1970, 40 y.o.   MRN: [id number]  HPI  40 year old male diagnosed with pneumonia 5 days ago. Cough, congestion, body aches, fever 103...started on avelox  X 10 days.  Now he come to clinic with wheezing, sweating. No shortness of breath. Chest ache from coughing.  Now fevers are only 99  Cough is better, body aches improved. Using zutripro every 4-6 hours.    Review of Systems  Constitutional: Positive for fever and chills. Negative for fatigue.  HENT: Negative for ear pain, postnasal drip and ear discharge.   Eyes: Negative for pain.  Respiratory: Positive for wheezing. Negative for chest tightness and shortness of breath.   Cardiovascular: Negative for chest pain and leg swelling.  Gastrointestinal: Negative for abdominal pain.       Objective:   Physical Exam  Constitutional: He appears well-developed and well-nourished.       Obese male in NAD  HENT:  Head: Normocephalic and atraumatic.  Right Ear: Hearing, tympanic membrane, external ear and ear canal normal. Tympanic membrane is not retracted and not bulging.  Left Ear: Hearing, tympanic membrane, external ear and ear canal normal. Tympanic membrane is not retracted and not bulging.  Nose: No mucosal edema or rhinorrhea.  Mouth/Throat: Uvula is midline and oropharynx is clear and moist. No oropharyngeal exudate.  Eyes: Conjunctivae, EOM and lids are normal. Pupils are equal, round, and reactive to light.  Neck: Trachea normal and phonation normal. No mass and no thyromegaly present.  Cardiovascular: Normal rate, regular rhythm, normal heart sounds and normal pulses.  Exam reveals no gallop, no distant heart sounds and no friction rub.   No murmur heard. Pulmonary/Chest: Not tachypneic. No respiratory distress. He has decreased breath sounds in the right upper field, the right middle field and the right lower field. He has no wheezes. He has rhonchi in the  right upper field, the right middle field and the right lower field. He has no rales.          Assessment & Plan:

## 2010-10-26 NOTE — Patient Instructions (Signed)
Rest. Push fluids. Call if not improving at end of antibiotic course.  Go to ER if severe shortness of breath.

## 2010-10-30 ENCOUNTER — Telehealth: Payer: Self-pay | Admitting: Internal Medicine

## 2010-10-30 NOTE — Telephone Encounter (Signed)
Needs f/u when he finishes antibiotic,req work appt on Monday. As he will be done with antibiotic.

## 2010-10-30 NOTE — Telephone Encounter (Signed)
Any open/avail slots on mon or Tuesday is fine.

## 2010-10-31 ENCOUNTER — Encounter: Payer: Self-pay | Admitting: Internal Medicine

## 2010-10-31 ENCOUNTER — Ambulatory Visit (INDEPENDENT_AMBULATORY_CARE_PROVIDER_SITE_OTHER): Payer: 59 | Admitting: Internal Medicine

## 2010-10-31 ENCOUNTER — Ambulatory Visit (INDEPENDENT_AMBULATORY_CARE_PROVIDER_SITE_OTHER)
Admission: RE | Admit: 2010-10-31 | Discharge: 2010-10-31 | Disposition: A | Discharge status: Home / Self Care | Payer: 59 | Source: Ambulatory Visit | Attending: Internal Medicine | Admitting: Internal Medicine

## 2010-10-31 VITALS — BP 126/84 | HR 83 | Temp 97.9°F | Wt >= 6400 oz

## 2010-10-31 DIAGNOSIS — J189 Pneumonia, unspecified organism: Secondary | ICD-10-CM

## 2010-10-31 DIAGNOSIS — I10 Essential (primary) hypertension: Secondary | ICD-10-CM

## 2010-10-31 DIAGNOSIS — E669 Obesity, unspecified: Secondary | ICD-10-CM

## 2010-10-31 DIAGNOSIS — E1169 Type 2 diabetes mellitus with other specified complication: Secondary | ICD-10-CM

## 2010-10-31 DIAGNOSIS — E119 Type 2 diabetes mellitus without complications: Secondary | ICD-10-CM

## 2010-10-31 DIAGNOSIS — E785 Hyperlipidemia, unspecified: Secondary | ICD-10-CM

## 2010-10-31 DIAGNOSIS — E7849 Other hyperlipidemia: Secondary | ICD-10-CM

## 2010-10-31 DIAGNOSIS — Z0289 Encounter for other administrative examinations: Secondary | ICD-10-CM

## 2010-10-31 MED ORDER — ATORVASTATIN CALCIUM 80 MG PO TABS: 80.0000 mg | ORAL_TABLET | Freq: Every day | ORAL | Status: DC

## 2010-10-31 NOTE — Assessment & Plan Note (Signed)
He will not take any meds for DM, I will start a statin

## 2010-10-31 NOTE — Assessment & Plan Note (Signed)
His BP is well controlled 

## 2010-10-31 NOTE — Assessment & Plan Note (Signed)
Start lipitor

## 2010-10-31 NOTE — Patient Instructions (Signed)
Pneumonia Pneumonia is an infection of the lungs. It may be caused by a bacteria or virus. Most forms are bacterial. Usually, these infections are caused by breathing infectious particles into the lungs (respiratory tract). SYMPTOMS  The most common problems (symptoms) are:   Cough.  Fever.   Chest pain.  Increased rate of breathing.   Wheezing.  Mucus production.   DIAGNOSIS  Often these infections are diagnosed on exam by your caregiver. Sometimes the diagnosis may require:   Chest X-rays.   Blood analysis.   Cultures. Blood cultures may be done to help find the cause of your pneumonia.  Your caregiver may do tests (blood gasses or pulse oximetry) to see how well your lungs are working. TREATMENT The bacterial pneumonias generally respond well to medicines (antibiotics) that kill germs. Viral infections must run their course. These infections will not respond to antibiotics. A pneumococcal shot (vaccine) is available to prevent a common bacterial pneumonia. This is usually suggested for the elderly and for other groups of higher risk individuals, such as those on chemotherapy or those who have problems with their immune system.  You will have pneumococcal screening or vaccination if you are over 25 years old and are not immunized.   If you are a smoker, it is time to quit. You may receive instructions on how to best stop smoking. Your caregiver can provide medicines and counseling to help you quit.  HOME CARE INSTRUCTIONS  Cough suppressants may be used if you are losing too much rest. However, coughing protects you by clearing your lungs. This is one reason for not using cough suppressants, if able, as they take away this protection.   Your caregiver may have prescribed an antibiotic if she or he feels your cough is caused by a bacterial infection. Take all your medicine until you are finished.   Your caregiver may also prescribe an expectorant to loosen the mucus to be coughed  up.   Only take over-the-counter or prescription medicines for pain, discomfort, or fever as directed by your caregiver.   Smoking is a common cause of bronchitis and can contribute to pneumonia. Stopping this habit is an important self-help step.   If you are a smoker and continue to smoke, your cough may last several weeks after your pneumonia has cleared.   A cold steam vaporizer or humidifier in your room or home may help loosen mucus.   Coughing is often worse at night. Sleeping in a semi-upright position in a recliner or using a couple pillows under your head will help with this.   Get rest as you feel it is needed. Your body will usually let you know when to rest.  SEEK IMMEDIATE MEDICAL CARE IF:  You develop pus-like mucus (sputum) or your illness becomes worse. This is especially true if you are elderly or weakened from any other disease.   You cannot control your cough with suppressants and are losing sleep.   You begin coughing up blood.   You develop pain which is getting worse or is uncontrolled with medicines.   You or your child has an oral temperature above 100.5, not controlled by medicine.   Any of the symptoms which initially brought you in for treatment are getting worse rather than better.   You develop shortness of breath or chest pain.  MAKE SURE YOU:   Understand these instructions.   Will watch your condition.   Will get help right away if you are not doing well or  get worse.  Document Released: 07/14/2005 Document Re-Released: 01/01/2010 [facility] Patient Information 2011 [place], [place].Hypercholesterolemia High Blood Cholesterol Cholesterol is a white, waxy, fat-like protein needed by your body in small amounts. The liver makes all the cholesterol you need. It is carried from the liver by the blood through the blood vessels. Deposits (plaque) may build up on blood vessel walls. This makes the arteries narrower and stiffer. Plaque increases the risk for  heart attack and stroke. You cannot feel your cholesterol level even if it is very high. The only way to know is by a blood test to check your lipid (fats) levels. Once you know your cholesterol levels, you should keep a record of the test results. Work with your caregiver to to keep your levels in the desired range. WHAT THE RESULTS MEAN:  Total cholesterol is a rough measure of all the cholesterol in your blood.   LDL is the so-called bad cholesterol. This is the type that deposits cholesterol in the walls of the arteries. You want this level to be low.   HDL is the good cholesterol because it cleans the arteries and carries the LDL away. You want this level to be high.   Triglycerides are fat that the body can either burn for energy or store. High levels are closely linked to heart disease.  DESIRED LEVELS:  Total cholesterol below 200.   LDL below 100 for people at risk, below 70 for very high risk.   HDL above 50 is good, above 60 is best.   Triglycerides below 150.  HOW TO LOWER YOUR CHOLESTEROL:  Diet.   Choose fish or white meat chicken and [place], roasted or baked. Limit fatty cuts of red meat, fried foods, and processed meats, such as sausage and lunch meat.   Eat lots of fresh fruits and vegetables. Choose whole grains, beans, pasta, potatoes and cereals.   Use only small amounts of olive, corn or canola oils. Avoid butter, mayonnaise, shortening or palm kernel oils. Avoid foods with trans-fats.   Use skim/nonfat milk and low-fat/nonfat yogurt and cheeses. Avoid whole milk, cream, ice cream, egg yolks and cheeses. Healthy desserts include angel food cake, gingersnaps, animal crackers, hard candy, popsicles, and low-fat/nonfat frozen yogurt. Avoid pastries, cakes, pies and cookies.   Exercise.   A regular program helps decrease LDL and raises HDL.   Helps with weight control.   Do things that increase your activity level like gardening, walking, or taking the stairs.     Medication.   May be prescribed by your caregiver to help lowering cholesterol and the risk for heart disease.   You may need medicine even if your levels are normal if you have several risk factors.  HOME CARE INSTRUCTIONS  Follow your diet and exercise programs as suggested by your caregiver.   Take medications as directed.   Have blood work done when your caregiver feels it is necessary.  MAKE SURE YOU:   Understand these instructions.   Will watch your condition.   Will get help right away if you are not doing well or get worse.  Document Released: 07/14/2005 Document Re-Released: 06/26/2008 [facility] Patient Information 2011 [place], [place].

## 2010-10-31 NOTE — Assessment & Plan Note (Addendum)
He sounds better, will check a cxr to look for resolution and/or complications, he is not feeling well enough to return to work at this time

## 2010-10-31 NOTE — Progress Notes (Signed)
Subjective:    Patient ID: Stephen Dunlap, male    DOB: 1970-10-31, 40 y.o.   MRN: [id number]  HPI He returns for f/up and tells me that he feels better but still has a mild cough (NP) and some chills and fatigue but no fever or SOB. He does not want to take any meds for DM   Review of Systems  Constitutional: Positive for chills and fatigue. Negative for fever, diaphoresis, activity change, appetite change and unexpected weight change.  HENT: Positive for sinus pressure. Negative for ear pain, congestion, sore throat, facial swelling, rhinorrhea, sneezing, trouble swallowing, neck pain, voice change, postnasal drip and ear discharge.   Eyes: Negative for pain and itching.  Respiratory: Positive for cough. Negative for apnea, choking, chest tightness, shortness of breath, wheezing and stridor.   Cardiovascular: Negative for chest pain, palpitations and leg swelling.  Gastrointestinal: Negative for nausea, vomiting, abdominal pain, diarrhea, constipation, blood in stool and abdominal distention.  Genitourinary: Negative for dysuria, urgency, frequency, hematuria, flank pain, decreased urine volume, scrotal swelling and difficulty urinating.  Musculoskeletal: Negative for myalgias, back pain, joint swelling, arthralgias and gait problem.  Skin: Negative for color change, pallor and rash.  Neurological: Negative for dizziness, tremors, facial asymmetry, weakness, light-headedness, numbness and headaches.  Hematological: Negative for adenopathy. Does not bruise/bleed easily.  Psychiatric/Behavioral: Negative for suicidal ideas, hallucinations, behavioral problems, confusion, sleep disturbance, self-injury, dysphoric mood, decreased concentration and agitation. The patient is not nervous/anxious and is not hyperactive.    Lab Results  Component Value Date   WBC 6.6 10/22/2010   HGB 14.4 10/22/2010   HCT 42.6 10/22/2010   PLT 208.0 10/22/2010   CHOL 204* 10/22/2010   TRIG 171.0* 10/22/2010   HDL  38.40* 10/22/2010   LDLDIRECT 145.8 10/22/2010   ALT 36 10/22/2010   AST 28 10/22/2010   NA 140 10/22/2010   K 4.4 10/22/2010   CL 106 10/22/2010   CREATININE 1.0 10/22/2010   BUN 9 10/22/2010   CO2 27 10/22/2010   TSH 0.97 10/22/2010   PSA 0.32 06/10/2010   HGBA1C 8.2* 10/22/2010      Objective:   Physical Exam  Nursing note and vitals reviewed. Constitutional: He is oriented to person, place, and time. He appears well-developed and well-nourished. No distress.  HENT:  Head: Normocephalic and atraumatic.  Right Ear: External ear normal.  Left Ear: External ear normal.  Nose: Nose normal.  Mouth/Throat: Oropharynx is clear and moist. No oropharyngeal exudate.  Eyes: Conjunctivae and EOM are normal. Pupils are equal, round, and reactive to light. Right eye exhibits no discharge. Left eye exhibits no discharge. No scleral icterus.  Neck: Normal range of motion. Neck supple. No thyromegaly present.  Cardiovascular: Normal rate, regular rhythm, normal heart sounds and intact distal pulses.  Exam reveals no gallop and no friction rub.   No murmur heard. Pulmonary/Chest: Effort normal and breath sounds normal. No respiratory distress. He has no wheezes. He has no rales. He exhibits no tenderness.  Abdominal: Soft. Bowel sounds are normal. He exhibits no distension and no mass. There is no tenderness. There is no rebound and no guarding.  Musculoskeletal: Normal range of motion. He exhibits no edema and no tenderness.  Lymphadenopathy:    He has no cervical adenopathy.  Neurological: He is alert and oriented to person, place, and time. He has normal reflexes. No cranial nerve deficit.  Skin: Skin is warm and dry. No rash noted. He is not diaphoretic. No erythema. No pallor.  Psychiatric: He has a normal mood and affect. His behavior is normal. Judgment and thought content normal.          Assessment & Plan:

## 2010-11-04 ENCOUNTER — Encounter: Payer: Self-pay | Admitting: Internal Medicine

## 2010-11-05 LAB — URINALYSIS, ROUTINE W REFLEX MICROSCOPIC
Bilirubin Urine: NEGATIVE
Glucose, UA: NEGATIVE mg/dL
Hgb urine dipstick: NEGATIVE
Protein, ur: NEGATIVE mg/dL
Specific Gravity, Urine: 1.03 (ref 1.005–1.030)
Urobilinogen, UA: 0.2 mg/dL (ref 0.0–1.0)

## 2010-12-10 NOTE — Op Note (Signed)
Stephen Dunlap, Stephen Dunlap                 ACCOUNT NO.:  [id number]   MEDICAL RECORD NO.:  [id number]          PATIENT TYPE:  AMB   LOCATION:  DAY                          FACILITY:  [facility]   PHYSICIAN:  Leonie Man, M.D.   DATE OF BIRTH:  02-08-1971   DATE OF PROCEDURE:  06/12/2008  DATE OF DISCHARGE:                               OPERATIVE REPORT   PREOPERATIVE DIAGNOSIS:  Incarcerated umbilical hernia.   POSTOPERATIVE DIAGNOSIS:  Incarcerated umbilical hernia.   PROCEDURE:  Repair of umbilical hernia with mesh (Proceed).   SURGEON:  Leonie Man, M.D.   ASSISTANT:  OR nurse.   ANESTHESIA:  General.   SPECIMENS TO LAB:  Hernia sac.   ESTIMATED BLOOD LOSS:  Was minimal.   COMPLICATIONS:  None apparent.  The patient returned to PACU in  excellent condition.   [name] is a 40 year old gentleman with early onset diabetes mellitus  as well as severe sleep apnea and morbid obesity.  He has been having  recurrent episodes of umbilical pain particular after eating or  coughing.  On examination he is seen to have a small but certainly  incarcerated umbilical hernia.  He comes to the operating room now for  repair.   PROCEDURE:  The patient is positioned supinely following induction of  satisfactory endotracheal anesthesia and the abdomen is prepped and  draped to be included in a sterile operative field.  Time-out identified  the patient as Stephen Dunlap.  Date of birth April 30, 1971, the procedure  to be done umbilical hernia repair with mesh, appropriate preoperative  antibiotics given, no airway or problems noted.   I made a circumareolar incision around the superior border of the  umbilicus, deepened this through skin and subcutaneous tissue carrying  the dissection down to the hernial sac.  The hernial sac was dissected  free on all sides down to the fascia.  Hernia sac is dissected away from  the fascia and the incarcerated omentum and falciform ligament are  secured with  clamps and tied with 2-0 silk sutures.  The edges of the  hernia then cleared of all adhesions and I used a medium Proceed patch,  inserted this into the hernia and secured it in all directions around  the hernia.  The tails of the hernia were pulled up so as to ensure  contact with the anterior abdominal wall.  The mesh was then sutured  down to the fascia with 0 Prolene sutures all around.  Then the hernia  defect itself was closed with a running 0 Prolene suture.  All sponge,  instrument and sharp counts were then doubly verified.  The subcutaneous  tissues closed with running 3-0 Vicryl suture.  Skin closed with running  4-0 Monocryl suture and reinforced with Steri-Strips and sterile  compressive dressing applied.  Anesthetic reversed.  The patient removed  from the operating room to the recovery room in stable condition.  He  tolerated the procedure well.      Leonie Man, M.D.  Electronically Signed     PB/MEDQ  D:  06/12/2008  T:  06/13/2008  Job:  [id number]

## 2010-12-30 ENCOUNTER — Encounter: Payer: Self-pay | Admitting: Internal Medicine

## 2010-12-31 ENCOUNTER — Ambulatory Visit: Payer: 59 | Admitting: Internal Medicine

## 2010-12-31 DIAGNOSIS — Z0289 Encounter for other administrative examinations: Secondary | ICD-10-CM

## 2010-12-31 NOTE — Progress Notes (Unsigned)
  Subjective:    Patient ID: Stephen Dunlap, male    DOB: April 26, 1971, 40 y.o.   MRN: [id number]  HPI    Review of Systems     Objective:   Physical Exam     Lab Results  Component Value Date   WBC 6.6 10/22/2010   HGB 14.4 10/22/2010   HCT 42.6 10/22/2010   PLT 208.0 10/22/2010   CHOL 204* 10/22/2010   TRIG 171.0* 10/22/2010   HDL 38.40* 10/22/2010   LDLDIRECT 145.8 10/22/2010   ALT 36 10/22/2010   AST 28 10/22/2010   NA 140 10/22/2010   K 4.4 10/22/2010   CL 106 10/22/2010   CREATININE 1.0 10/22/2010   BUN 9 10/22/2010   CO2 27 10/22/2010   TSH 0.97 10/22/2010   PSA 0.32 06/10/2010   HGBA1C 8.2* 10/22/2010     Assessment & Plan:

## 2011-02-21 ENCOUNTER — Other Ambulatory Visit (INDEPENDENT_AMBULATORY_CARE_PROVIDER_SITE_OTHER): Payer: 59

## 2011-02-21 ENCOUNTER — Ambulatory Visit (INDEPENDENT_AMBULATORY_CARE_PROVIDER_SITE_OTHER): Payer: 59 | Admitting: Gastroenterology

## 2011-02-21 ENCOUNTER — Encounter: Payer: Self-pay | Admitting: Gastroenterology

## 2011-02-21 VITALS — BP 140/100 | HR 100 | Ht 77.0 in | Wt >= 6400 oz

## 2011-02-21 DIAGNOSIS — K219 Gastro-esophageal reflux disease without esophagitis: Secondary | ICD-10-CM

## 2011-02-21 DIAGNOSIS — R110 Nausea: Secondary | ICD-10-CM

## 2011-02-21 LAB — CBC WITH DIFFERENTIAL/PLATELET
Eosinophils Relative: 1.4 % (ref 0.0–5.0)
HCT: 42.5 % (ref 39.0–52.0)
Hemoglobin: 14.4 g/dL (ref 13.0–17.0)
Lymphs Abs: 3.1 10*3/uL (ref 0.7–4.0)
MCV: 84.5 fl (ref 78.0–100.0)
Monocytes Absolute: 0.5 10*3/uL (ref 0.1–1.0)
Monocytes Relative: 5.4 % (ref 3.0–12.0)
Neutro Abs: 6.1 10*3/uL (ref 1.4–7.7)
Platelets: 239 10*3/uL (ref 150.0–400.0)
WBC: 10 10*3/uL (ref 4.5–10.5)

## 2011-02-21 LAB — COMPREHENSIVE METABOLIC PANEL
Alkaline Phosphatase: 89 U/L (ref 39–117)
BUN: 14 mg/dL (ref 6–23)
CO2: 29 mEq/L (ref 19–32)
Creatinine, Ser: 0.9 mg/dL (ref 0.4–1.5)
GFR: 119.12 mL/min (ref >60.00)
Glucose, Bld: 208 mg/dL — ABNORMAL HIGH (ref 70–99)
Sodium: 141 mEq/L (ref 135–145)
Total Bilirubin: 0.4 mg/dL (ref 0.3–1.2)
Total Protein: 7.9 g/dL (ref 6.0–8.3)

## 2011-02-21 NOTE — Patient Instructions (Addendum)
You will be set up for an ultrasound for post prandial nausea, checking for gallstones.  [facility] 02/26/11 745 am arrival Nothing to eat or drink after midnight. Please start taking citrucel (orange flavored) powder fiber supplement.  This may cause some bloating at first but that usually goes away. Begin with a small spoonful and work your way up to a large, heaping spoonful daily over a week.  Call Dr. Christella Hartigan' office in 4 weeks to report on your symptoms.  Does moving your bowel easier help the upset stomach feeling.   Trial of PPI (samples given).  Prilosec samples given You will have labs checked today in the basement lab.  Please head down after you check out with the front desk  (cbc, cmet).

## 2011-02-21 NOTE — Progress Notes (Signed)
Review of pertinent gastrointestinal problems: 1. minor rectal bleeding from hemorrhoids. Colonoscopy December 2011 found no polyps. There were small hemorrhoids. Repeat colonoscopy at 10 year interval   HPI: This is a very pleasant 40 year old man whom I last saw about 6 or 8 months ago at the time of the colonoscopy. See those results summarized above  Has had nuasea, upset stomach.  Vomited 2-3 times.  No significant pains.  CAn occur after meals, (greasy, meats, beefs).  Salads don't cause a problems.  On a "fat burner" green tea.  Sometimes has pyrosis.  No clear dysphagia.  Has also been more constipated lately.   He has been on protein supplements. Working out a lot more to try to lose weight  Intentionally lost 10 pounds in 6 months.   Past Medical History:   Hypertension                                                 Diabetes mellitus                                            Prostatitis                                                  Morbid obesity                                               UTI (urinary tract infection)                               Past Surgical History:   HERNIA REPAIR                                                cysto bedside                                                reports that he has never smoked. He has never used smokeless tobacco. He reports that he drinks alcohol. He reports that he does not use illicit drugs.  family history includes Arthritis in his other; Cancer in his other; Diabetes in his other; Hyperlipidemia in his other; and Hypertension in his other.    Current medicines and allergies were reviewed in [facility]    Physical Exam: BP 140/100  Pulse 100  Ht 6\' 5"  (1.956 m)  Wt 421 lb (190.964 kg)  BMI 49.92 kg/m2 Constitutional: generally well-appearing Psychiatric: alert and oriented x3 Abdomen: soft, nontender, nondistended, no obvious ascites, no peritoneal signs, normal bowel sounds     Assessment and  plan: 40 y.o. male with nausea, indigestion, relative constipation  Perhaps some  of his symptoms are from his constipation. I am going to have him start fiber supplements to see if that helps not only his constipation but also his upper GI sensation. He will also try proton pump inhibitor once daily and I am setting him up with an abdominal ultrasound to see if he has gallstones. He is a very large man and not sure that ultrasound will be affective for him he will also get basic set of labs today including a CBC, complete metabolic profile

## 2011-02-26 ENCOUNTER — Ambulatory Visit (HOSPITAL_COMMUNITY)
Admission: RE | Admit: 2011-02-26 | Discharge: 2011-02-26 | Disposition: A | Discharge status: Home / Self Care | Payer: 59 | Source: Ambulatory Visit | Attending: Gastroenterology | Admitting: Gastroenterology

## 2011-02-26 DIAGNOSIS — K7689 Other specified diseases of liver: Secondary | ICD-10-CM | POA: Insufficient documentation

## 2011-02-26 DIAGNOSIS — R110 Nausea: Secondary | ICD-10-CM

## 2011-03-05 ENCOUNTER — Other Ambulatory Visit (INDEPENDENT_AMBULATORY_CARE_PROVIDER_SITE_OTHER): Payer: 59

## 2011-03-05 ENCOUNTER — Ambulatory Visit (INDEPENDENT_AMBULATORY_CARE_PROVIDER_SITE_OTHER): Payer: 59 | Admitting: Internal Medicine

## 2011-03-05 ENCOUNTER — Ambulatory Visit (INDEPENDENT_AMBULATORY_CARE_PROVIDER_SITE_OTHER)
Admission: RE | Admit: 2011-03-05 | Discharge: 2011-03-05 | Disposition: A | Discharge status: Home / Self Care | Payer: 59 | Source: Ambulatory Visit | Attending: Internal Medicine | Admitting: Internal Medicine

## 2011-03-05 ENCOUNTER — Encounter: Payer: Self-pay | Admitting: Internal Medicine

## 2011-03-05 DIAGNOSIS — E7849 Other hyperlipidemia: Secondary | ICD-10-CM

## 2011-03-05 DIAGNOSIS — J189 Pneumonia, unspecified organism: Secondary | ICD-10-CM

## 2011-03-05 DIAGNOSIS — E785 Hyperlipidemia, unspecified: Secondary | ICD-10-CM

## 2011-03-05 DIAGNOSIS — E119 Type 2 diabetes mellitus without complications: Secondary | ICD-10-CM

## 2011-03-05 DIAGNOSIS — R05 Cough: Secondary | ICD-10-CM

## 2011-03-05 DIAGNOSIS — R059 Cough, unspecified: Secondary | ICD-10-CM

## 2011-03-05 DIAGNOSIS — I10 Essential (primary) hypertension: Secondary | ICD-10-CM

## 2011-03-05 LAB — COMPREHENSIVE METABOLIC PANEL
ALT: 33 U/L (ref 0–53)
Albumin: 4.1 g/dL (ref 3.5–5.2)
CO2: 29 mEq/L (ref 19–32)
Calcium: 9.4 mg/dL (ref 8.4–10.5)
Chloride: 101 mEq/L (ref 96–112)
GFR: 120.63 mL/min (ref >60.00)
Glucose, Bld: 326 mg/dL — ABNORMAL HIGH (ref 70–99)
Potassium: 4.2 mEq/L (ref 3.5–5.1)
Sodium: 138 mEq/L (ref 135–145)
Total Bilirubin: 0.9 mg/dL (ref 0.3–1.2)
Total Protein: 7.6 g/dL (ref 6.0–8.3)

## 2011-03-05 LAB — HEMOGLOBIN A1C: Hgb A1c MFr Bld: 10 % — ABNORMAL HIGH (ref 4.6–6.5)

## 2011-03-05 MED ORDER — PSEUDOEPH-CHLORPHEN-HYDROCOD 60-4-5 MG/5ML PO SOLN: 5.0000 mL | Freq: Four times a day (QID) | ORAL | Status: DC | PRN

## 2011-03-05 MED ORDER — MOXIFLOXACIN HCL 400 MG PO TABS: 400.0000 mg | ORAL_TABLET | Freq: Every day | ORAL | Status: AC

## 2011-03-05 NOTE — Progress Notes (Signed)
Subjective:    Patient ID: Stephen Dunlap, male    DOB: March 12, 1971, 40 y.o.   MRN: [id number]  Cough This is a new problem. The current episode started 1 to 4 weeks ago. The problem has been unchanged. The problem occurs every few hours. The cough is productive of purulent sputum. Associated symptoms include chills. Pertinent negatives include no chest pain, ear congestion, ear pain, eye redness, fever, headaches, heartburn, hemoptysis, myalgias, nasal congestion, postnasal drip, rash, rhinorrhea, sore throat, shortness of breath, sweats, weight loss or wheezing. The symptoms are aggravated by nothing. He has tried OTC cough suppressant for the symptoms. The treatment provided no relief. His past medical history is significant for pneumonia.  Diabetes He presents for his follow-up diabetic visit. He has type 2 diabetes mellitus. His disease course has been fluctuating. There are no hypoglycemic associated symptoms. Pertinent negatives for hypoglycemia include no dizziness, headaches, pallor, seizures, speech difficulty, sweats or tremors. Pertinent negatives for diabetes include no blurred vision, no chest pain, no fatigue, no foot paresthesias, no foot ulcerations, no polydipsia, no polyphagia, no polyuria, no visual change, no weakness and no weight loss. There are no hypoglycemic complications. Symptoms are stable. There are no diabetic complications. When asked about current treatments, none were reported. He is compliant with treatment none of the time. His weight is increasing steadily. He is following a generally unhealthy diet. When asked about meal planning, he reported none. He has not had a previous visit with a dietician. He never participates in exercise. There is no change in his home blood glucose trend. He does not see a podiatrist.Eye exam is not current.      Review of Systems  Constitutional: Positive for chills. Negative for fever, weight loss, diaphoresis, activity change, appetite  change, fatigue and unexpected weight change.  HENT: Negative for hearing loss, ear pain, sore throat, facial swelling, rhinorrhea, trouble swallowing, neck pain, neck stiffness, voice change, postnasal drip and ear discharge.   Eyes: Negative for blurred vision, photophobia, pain, discharge, redness, itching and visual disturbance.  Respiratory: Positive for cough. Negative for apnea, hemoptysis, choking, chest tightness, shortness of breath, wheezing and stridor.   Cardiovascular: Negative for chest pain, palpitations and leg swelling.  Gastrointestinal: Negative for heartburn, nausea, vomiting, abdominal pain, diarrhea, constipation and abdominal distention.  Genitourinary: Negative for dysuria, urgency, polyuria, frequency, hematuria, flank pain, decreased urine volume, enuresis and difficulty urinating.  Musculoskeletal: Negative for myalgias, back pain, joint swelling, arthralgias and gait problem.  Skin: Negative for color change, pallor, rash and wound.  Neurological: Negative for dizziness, tremors, seizures, syncope, facial asymmetry, speech difficulty, weakness, light-headedness, numbness and headaches.  Hematological: Negative for polydipsia, polyphagia and adenopathy. Does not bruise/bleed easily.  Psychiatric/Behavioral: Negative.        Objective:   Physical Exam  Vitals reviewed. Constitutional: He is oriented to person, place, and time. He appears well-developed and well-nourished. No distress.  HENT:  Head: Normocephalic and atraumatic.  Right Ear: External ear normal.  Left Ear: External ear normal.  Nose: Nose normal.  Mouth/Throat: Oropharynx is clear and moist. No oropharyngeal exudate.  Eyes: Conjunctivae and EOM are normal. Pupils are equal, round, and reactive to light. Right eye exhibits no discharge. Left eye exhibits no discharge. No scleral icterus.  Neck: Normal range of motion. Neck supple. No JVD present. No tracheal deviation present. No thyromegaly present.   Cardiovascular: Normal rate, regular rhythm, normal heart sounds and intact distal pulses.  Exam reveals no gallop and no friction rub.  No murmur heard. Pulmonary/Chest: Effort normal and breath sounds normal. No stridor. No respiratory distress. He has no wheezes. He has no rales. He exhibits no tenderness.  Abdominal: Soft. Bowel sounds are normal. He exhibits no distension and no mass. There is no tenderness. There is no rebound and no guarding.  Musculoskeletal: Normal range of motion. He exhibits no edema and no tenderness.  Lymphadenopathy:    He has no cervical adenopathy.  Neurological: He is alert and oriented to person, place, and time. He has normal reflexes. He displays normal reflexes. No cranial nerve deficit. He exhibits normal muscle tone. Coordination normal.  Skin: Skin is warm and dry. No rash noted. He is not diaphoretic. No erythema. No pallor.  Psychiatric: He has a normal mood and affect. His behavior is normal. Judgment and thought content normal.      Lab Results  Component Value Date   WBC 10.0 02/21/2011   HGB 14.4 02/21/2011   HCT 42.5 02/21/2011   PLT 239.0 02/21/2011   CHOL 204* 10/22/2010   TRIG 171.0* 10/22/2010   HDL 38.40* 10/22/2010   LDLDIRECT 145.8 10/22/2010   ALT 38 02/21/2011   AST 37 02/21/2011   NA 141 02/21/2011   K 4.1 02/21/2011   CL 103 02/21/2011   CREATININE 0.9 02/21/2011   BUN 14 02/21/2011   CO2 29 02/21/2011   TSH 0.97 10/22/2010   PSA 0.32 06/10/2010   HGBA1C 8.2* 10/22/2010      Assessment & Plan:

## 2011-03-05 NOTE — Patient Instructions (Signed)
Diabetes, Type 2 Diabetes is a lasting (chronic) disease. In type 2 diabetes, the pancreas does not make enough insulin (a hormone), and the body does not respond normally to the insulin that is made. This type of diabetes was also previously called adult onset diabetes. About 90% of all those who have diabetes have type 2. It usually occurs after the age of 63 but can occur at any age. CAUSES Unlike type 1 diabetes, which happens because insulin is no longer being made, type 2 diabetes happens because the body is making less insulin and has trouble using the insulin properly. SYMPTOMS  Drinking more than usual.   Urinating more than usual.   Blurred vision.   Dry, itchy skin.   Frequent infection like yeast infections in women.   More tired than usual (fatigue).  TREATMENT  Healthy eating.   Exercise.   Medication, if needed.   Monitoring blood glucose (sugar).   Seeing your caregiver regularly.  HOME CARE INSTRUCTIONS  Check your blood glucose (sugar) at least once daily. More frequent monitoring may be necessary, depending on your medications and on how well your diabetes is controlled. Your caregiver will advise you.   Take your medicine as directed by your caregiver.   Do not smoke.   Make wise food choices. Ask your caregiver for information. Weight loss can improve your diabetes.   Learn about low blood glucose (hypoglycemia) and how to treat it.   Get your eyes checked regularly.   Have a yearly physical exam. Have your blood pressure checked. Get your blood and urine tested.   Wear a pendant or bracelet saying that you have diabetes.   Check your feet every night for sores. Let your caregiver know if you have sores that are not healing.  SEEK MEDICAL CARE IF:  You are having problems keeping your blood glucose at target range.   You feel you might be having problems with your medicines.   You have symptoms of an illness that is not improving after 24  hours.   You have a sore or wound that is not healing.   You notice a change in vision or a new problem with your vision.   You develop a fever of more than 100.5.  Document Released: 07/14/2005 Document Re-Released: 08/05/2009 [facility] Patient Information 2011 [place], [place].Acute Bronchitis You have acute bronchitis. This means you have a chest cold. The airways in your lungs are inflamed (red and sore). Acute means it is sudden onset. Bronchitis is most often caused by a virus. In smokers, people with chronic lung problems, and elderly patients, treatment with antibiotics for bacterial infection may be needed. Exposure to cigarette smoke or irritating chemicals will make bronchitis worse. Allergies and asthma can also make bronchitis worse. Repeated episodes of bronchitis may cause long standing lung problems. Acute bronchitis is usually treated with rest, fluids, and medicines for relief of fever or cough. Bronchodilator medicines from metered inhalers or a nebulizer may be used to help open up the small airways. This reduces shortness of breath and helps control cough. Antibiotics can be prescribed if you are more seriously ill or at risk. A cool air vaporizer may help thin bronchial secretions and make it easier to clear your chest. Increased fluids may also help. You must avoid smoking, even second hand exposure. If you are a cigarette smoker, consider using nicotine gum or skin patches to help control withdrawal symptoms. Recovery from bronchitis is often slow, but you should start feeling better  after 2-3 days. Cough from bronchitis frequently lasts for 3-4 weeks.  SEEK IMMEDIATE MEDICAL CARE IF YOU DEVELOP:  Increased fever, chills, or chest pain.   Severe shortness of breath or bloody sputum.   Dehydration, fainting, repeated vomiting, severe headache.   No improvement after one week of proper treatment.  MAKE SURE YOU:   Understand these instructions.   Will watch your  condition.   Will get help right away if you are not doing well or get worse.  Document Released: 08/21/2004 Document Re-Released: 06/26/2008 [facility] Patient Information 2011 [place], [place].

## 2011-03-05 NOTE — Assessment & Plan Note (Signed)
His BP is well controlled 

## 2011-03-05 NOTE — Assessment & Plan Note (Signed)
Check a cxr today and use zutripro for the cough

## 2011-03-05 NOTE — Assessment & Plan Note (Signed)
I will check his A1C today, he is working on lifestyle modifications, he continues to state that he does not want to start a medicine

## 2011-03-05 NOTE — Assessment & Plan Note (Signed)
He refuses to take a statin 

## 2011-03-05 NOTE — Assessment & Plan Note (Signed)
He will have a cxr today and will start avelox for the infection and zutripro for the symptom relief

## 2011-04-21 LAB — WOUND CULTURE: Gram Stain: NONE SEEN

## 2011-04-29 LAB — CBC
HCT: 41.6
MCV: 85.9
Platelets: 243
RBC: 4.85
WBC: 9

## 2011-04-29 LAB — COMPREHENSIVE METABOLIC PANEL
AST: 21
Albumin: 3.9
Alkaline Phosphatase: 83
BUN: 9
CO2: 25
Chloride: 108
GFR calc Af Amer: >60
GFR calc non Af Amer: >60
Potassium: 3.7
Total Bilirubin: 0.7

## 2011-04-29 LAB — DIFFERENTIAL
Basophils Absolute: 0.1
Basophils Relative: 1
Eosinophils Relative: 2
Monocytes Absolute: 0.6

## 2011-04-29 LAB — HEMOGLOBIN A1C: Hgb A1c MFr Bld: 8.1 — ABNORMAL HIGH

## 2011-05-05 ENCOUNTER — Ambulatory Visit: Payer: 59 | Admitting: Internal Medicine

## 2011-05-05 DIAGNOSIS — Z0289 Encounter for other administrative examinations: Secondary | ICD-10-CM

## 2011-05-30 ENCOUNTER — Ambulatory Visit (INDEPENDENT_AMBULATORY_CARE_PROVIDER_SITE_OTHER): Payer: 59 | Admitting: Endocrinology

## 2011-05-30 ENCOUNTER — Other Ambulatory Visit (INDEPENDENT_AMBULATORY_CARE_PROVIDER_SITE_OTHER): Payer: 59

## 2011-05-30 ENCOUNTER — Encounter: Payer: Self-pay | Admitting: Endocrinology

## 2011-05-30 VITALS — BP 118/82 | HR 85 | Temp 98.1°F | Ht 77.0 in | Wt 398.1 lb

## 2011-05-30 DIAGNOSIS — E119 Type 2 diabetes mellitus without complications: Secondary | ICD-10-CM

## 2011-05-30 LAB — HEMOGLOBIN A1C: Hgb A1c MFr Bld: 13.1 % — ABNORMAL HIGH (ref 4.6–6.5)

## 2011-05-30 MED ORDER — METFORMIN HCL ER 500 MG PO TB24: ORAL_TABLET | ORAL | Status: DC

## 2011-05-30 MED ORDER — FLUCONAZOLE 150 MG PO TABS: 150.0000 mg | ORAL_TABLET | Freq: Every day | ORAL | Status: AC

## 2011-05-30 NOTE — Patient Instructions (Addendum)
i have sent a prescription to your pharmacy, for a pill against the yeast. blood tests are being requested for you today.  please call [phone] to hear your test results.  You will be prompted to enter the 9-digit "MRN" number that appears at the top left of this page, followed by #.  Then you will hear the message. Based on the results, i may advise you to take a diabetes pill (metformin).   (update: i left message on phone-tree:  a1c is very high.  i sent rx for metformin.  You need insulin to control your glucose.  Call if you agree).

## 2011-05-30 NOTE — Progress Notes (Signed)
  Subjective:    Patient ID: Stephen Dunlap, male    DOB: 12-Feb-1971, 40 y.o.   MRN: [id number]  HPI Pt states 1 week of moderate itching at the penis, and assoc white cutaneous d/c. Wife says he is in denial about his dm. Past Medical History  Diagnosis Date  . Hypertension   . Diabetes mellitus   . Prostatitis   . Morbid obesity   . UTI (urinary tract infection)     Past Surgical History  Procedure Date  . Hernia repair   . Cysto bedside     History   Social History  . Marital Status: Married    Spouse Name: N/A    Number of Children: 3  . Years of Education: N/A   Occupational History  . chemical process operator    Social History Main Topics  . Smoking status: Never Smoker   . Smokeless tobacco: Never Used  . Alcohol Use: 1.2 oz/week    2 Cans of beer per week     Socially  . Drug Use: No  . Sexually Active: Yes   Other Topics Concern  . Not on file   Social History Narrative  . No narrative on file    Current Outpatient Prescriptions on File Prior to Visit  Medication Sig Dispense Refill  . cetirizine (ZYRTEC) 10 MG tablet Take 10 mg by mouth daily. Prn for allergies       . Multiple Vitamin (MULTIVITAMIN) tablet Take 1 tablet by mouth daily.        . Nutritional Supplements (SOY PROTEIN SHAKE PO) Take by mouth daily. W/gym workout       . Omeprazole Magnesium (PRILOSEC OTC PO) Take by mouth.        . Pseudoeph-Chlorphen-Hydrocod (ZUTRIPRO) 60-4-5 MG/5ML SOLN Take 5 mLs by mouth 4 (four) times daily as needed.  240 mL  1    No Known Allergies  Family History  Problem Relation Age of Onset  . Arthritis Other   . Cancer Other     breast  . Diabetes Other   . Hypertension Other   . Hyperlipidemia Other     BP 118/82  Pulse 85  Temp(Src) 98.1 F (36.7 C) (Oral)  Ht 6\' 5"  (1.956 m)  Wt 398 lb 2 oz (180.588 kg)  BMI 47.21 kg/m2  SpO2 98%  Review of Systems He has lost weight, due to his efforts. He has urinary frequency.    Objective:   Physical Exam VITAL SIGNS:  See vs page GENERAL: no distress.  Morbid obesity. Penis: slightly red.  Lab Results  Component Value Date   HGBA1C 13.1* 05/30/2011      Assessment & Plan:  Yeast balanitis, new Dm, with severe hyperglycemia.

## 2011-07-04 ENCOUNTER — Ambulatory Visit (INDEPENDENT_AMBULATORY_CARE_PROVIDER_SITE_OTHER)
Admission: RE | Admit: 2011-07-04 | Discharge: 2011-07-04 | Disposition: A | Discharge status: Home / Self Care | Payer: 59 | Source: Ambulatory Visit | Attending: Internal Medicine | Admitting: Internal Medicine

## 2011-07-04 ENCOUNTER — Encounter: Payer: Self-pay | Admitting: Internal Medicine

## 2011-07-04 ENCOUNTER — Ambulatory Visit (INDEPENDENT_AMBULATORY_CARE_PROVIDER_SITE_OTHER): Payer: 59 | Admitting: Internal Medicine

## 2011-07-04 VITALS — BP 128/80 | HR 100 | Temp 100.4°F | Resp 16

## 2011-07-04 DIAGNOSIS — R05 Cough: Secondary | ICD-10-CM

## 2011-07-04 DIAGNOSIS — R059 Cough, unspecified: Secondary | ICD-10-CM

## 2011-07-04 DIAGNOSIS — J189 Pneumonia, unspecified organism: Secondary | ICD-10-CM

## 2011-07-04 MED ORDER — MOXIFLOXACIN HCL 400 MG PO TABS: 400.0000 mg | ORAL_TABLET | Freq: Every day | ORAL | Status: AC

## 2011-07-04 MED ORDER — PSEUDOEPH-CHLORPHEN-HYDROCOD 60-4-5 MG/5ML PO SOLN: 5.0000 mL | Freq: Four times a day (QID) | ORAL | Status: DC | PRN

## 2011-07-04 NOTE — Assessment & Plan Note (Signed)
I will check his CXR to see if the PNA has recurred

## 2011-07-04 NOTE — Patient Instructions (Signed)

## 2011-07-04 NOTE — Progress Notes (Signed)
Subjective:    Patient ID: Stephen Dunlap, male    DOB: 1970-10-27, 40 y.o.   MRN: [id number]  Cough This is a new problem. The current episode started yesterday. The problem has been gradually worsening. The problem occurs every few minutes. The cough is productive of purulent sputum. Associated symptoms include chills, a fever, headaches, myalgias, nasal congestion, postnasal drip, rhinorrhea and a sore throat. Pertinent negatives include no chest pain, ear congestion, ear pain, heartburn, hemoptysis, rash, shortness of breath, sweats, weight loss or wheezing. The treatment provided no relief.      Review of Systems  Constitutional: Positive for fever and chills. Negative for weight loss, diaphoresis, activity change, appetite change, fatigue and unexpected weight change.  HENT: Positive for sore throat, rhinorrhea and postnasal drip. Negative for ear pain, trouble swallowing and voice change.   Eyes: Negative.   Respiratory: Positive for cough. Negative for hemoptysis, chest tightness, shortness of breath, wheezing and stridor.   Cardiovascular: Negative for chest pain.  Gastrointestinal: Negative for heartburn, nausea, vomiting, abdominal pain, diarrhea, constipation and blood in stool.  Genitourinary: Negative for dysuria, urgency, frequency, hematuria, flank pain, decreased urine volume, enuresis and difficulty urinating.  Musculoskeletal: Positive for myalgias. Negative for back pain, joint swelling, arthralgias and gait problem.  Skin: Negative for color change, pallor, rash and wound.  Neurological: Positive for headaches. Negative for dizziness, tremors, seizures, syncope, facial asymmetry, speech difficulty, weakness, light-headedness and numbness.  Hematological: Negative for adenopathy. Does not bruise/bleed easily.  Psychiatric/Behavioral: Negative.        Objective:   Physical Exam  Vitals reviewed. Constitutional: He is oriented to person, place, and time. He appears  well-developed and well-nourished. No distress.  HENT:  Head: Normocephalic and atraumatic.  Mouth/Throat: Oropharynx is clear and moist. No oropharyngeal exudate.  Eyes: Conjunctivae are normal. Right eye exhibits no discharge. Left eye exhibits no discharge. No scleral icterus.  Neck: Normal range of motion. Neck supple. No JVD present. No tracheal deviation present. No thyromegaly present.  Cardiovascular: Normal rate, regular rhythm, normal heart sounds and intact distal pulses.  Exam reveals no gallop and no friction rub.   No murmur heard. Pulmonary/Chest: Effort normal and breath sounds normal. No stridor. No respiratory distress. He has no wheezes. He has no rales. He exhibits no tenderness.  Abdominal: Soft. Bowel sounds are normal. He exhibits no distension and no mass. There is no tenderness. There is no rebound and no guarding.  Musculoskeletal: Normal range of motion. He exhibits no edema and no tenderness.  Lymphadenopathy:    He has no cervical adenopathy.  Neurological: He is oriented to person, place, and time.  Skin: Skin is warm and dry. No rash noted. He is not diaphoretic. No erythema. No pallor.  Psychiatric: He has a normal mood and affect. His behavior is normal. Judgment and thought content normal.     Lab Results  Component Value Date   WBC 10.0 02/21/2011   HGB 14.4 02/21/2011   HCT 42.5 02/21/2011   PLT 239.0 02/21/2011   GLUCOSE 326* 03/05/2011   CHOL 204* 10/22/2010   TRIG 171.0* 10/22/2010   HDL 38.40* 10/22/2010   LDLDIRECT 145.8 10/22/2010   ALT 33 03/05/2011   AST 26 03/05/2011   NA 138 03/05/2011   K 4.2 03/05/2011   CL 101 03/05/2011   CREATININE 0.9 03/05/2011   BUN 13 03/05/2011   CO2 29 03/05/2011   TSH 0.97 10/22/2010   PSA 0.32 06/10/2010   HGBA1C 13.1* 05/30/2011  Assessment & Plan:

## 2011-07-04 NOTE — Assessment & Plan Note (Signed)
Check a CXR, start avelox for the infection and zutripro for the cough and congestion

## 2011-11-05 LAB — HM DIABETES EYE EXAM: HM Diabetic Eye Exam: NORMAL

## 2011-11-17 ENCOUNTER — Other Ambulatory Visit (INDEPENDENT_AMBULATORY_CARE_PROVIDER_SITE_OTHER): Payer: 59

## 2011-11-17 ENCOUNTER — Ambulatory Visit (INDEPENDENT_AMBULATORY_CARE_PROVIDER_SITE_OTHER): Payer: 59 | Admitting: Internal Medicine

## 2011-11-17 ENCOUNTER — Encounter: Payer: Self-pay | Admitting: Internal Medicine

## 2011-11-17 VITALS — BP 138/90 | HR 93 | Temp 97.6°F | Resp 16 | Wt >= 6400 oz

## 2011-11-17 DIAGNOSIS — E7849 Other hyperlipidemia: Secondary | ICD-10-CM

## 2011-11-17 DIAGNOSIS — E785 Hyperlipidemia, unspecified: Secondary | ICD-10-CM

## 2011-11-17 DIAGNOSIS — I10 Essential (primary) hypertension: Secondary | ICD-10-CM

## 2011-11-17 DIAGNOSIS — E669 Obesity, unspecified: Secondary | ICD-10-CM

## 2011-11-17 DIAGNOSIS — G4730 Sleep apnea, unspecified: Secondary | ICD-10-CM

## 2011-11-17 DIAGNOSIS — R5381 Other malaise: Secondary | ICD-10-CM

## 2011-11-17 DIAGNOSIS — E1169 Type 2 diabetes mellitus with other specified complication: Secondary | ICD-10-CM

## 2011-11-17 DIAGNOSIS — E119 Type 2 diabetes mellitus without complications: Secondary | ICD-10-CM

## 2011-11-17 DIAGNOSIS — G4733 Obstructive sleep apnea (adult) (pediatric): Secondary | ICD-10-CM | POA: Insufficient documentation

## 2011-11-17 DIAGNOSIS — R5383 Other fatigue: Secondary | ICD-10-CM

## 2011-11-17 LAB — URINALYSIS, ROUTINE W REFLEX MICROSCOPIC
Ketones, ur: NEGATIVE
Leukocytes, UA: NEGATIVE
Nitrite: NEGATIVE
Specific Gravity, Urine: >=1.03 (ref 1.000–1.030)
Urobilinogen, UA: 0.2 (ref 0.0–1.0)

## 2011-11-17 LAB — CBC WITH DIFFERENTIAL/PLATELET
Basophils Absolute: 0 10*3/uL (ref 0.0–0.1)
Basophils Relative: 0.5 % (ref 0.0–3.0)
Eosinophils Absolute: 0.2 10*3/uL (ref 0.0–0.7)
Lymphocytes Relative: 35.4 % (ref 12.0–46.0)
MCHC: 32.8 g/dL (ref 30.0–36.0)
Monocytes Relative: 5.8 % (ref 3.0–12.0)
Neutrophils Relative %: 56.3 % (ref 43.0–77.0)
RBC: 4.91 Mil/uL (ref 4.22–5.81)
RDW: 13.5 % (ref 11.5–14.6)

## 2011-11-17 LAB — GLUCOSE, POCT (MANUAL RESULT ENTRY): POC Glucose: 180

## 2011-11-17 MED ORDER — OLMESARTAN MEDOXOMIL 20 MG PO TABS: 20.0000 mg | ORAL_TABLET | Freq: Every day | ORAL | Status: DC

## 2011-11-17 MED ORDER — ROSUVASTATIN CALCIUM 10 MG PO TABS: 10.0000 mg | ORAL_TABLET | Freq: Every day | ORAL | Status: DC

## 2011-11-17 MED ORDER — SAXAGLIPTIN-METFORMIN ER 5-1000 MG PO TB24: 1.0000 | ORAL_TABLET | Freq: Every day | ORAL | Status: DC

## 2011-11-17 NOTE — Progress Notes (Signed)
Subjective:    Patient ID: Stephen Dunlap, male    DOB: 08-13-1970, 41 y.o.   MRN: [id number]  Diabetes He presents for his follow-up diabetic visit. He has type 2 diabetes mellitus. His disease course has been stable. There are no hypoglycemic associated symptoms. Pertinent negatives for hypoglycemia include no dizziness, headaches, pallor, seizures, speech difficulty, sweats or tremors. Associated symptoms include fatigue. Pertinent negatives for diabetes include no blurred vision, no chest pain, no foot paresthesias, no foot ulcerations, no polydipsia, no polyphagia, no polyuria, no visual change, no weakness and no weight loss. There are no hypoglycemic complications. Symptoms are stable. There are no diabetic complications. Current diabetic treatment includes oral agent (monotherapy). He is compliant with treatment some of the time. His weight is decreasing steadily. He is following a generally healthy diet. Meal planning includes avoidance of concentrated sweets. He has had a previous visit with a dietician. He participates in exercise three times a week. There is no change in his home blood glucose trend. An ACE inhibitor/angiotensin II receptor blocker is not being taken. He does not see a podiatrist.Eye exam is not current.  Hyperlipidemia This is a chronic problem. The current episode started more than 1 year ago. The problem is uncontrolled. Recent lipid tests were reviewed and are variable. Exacerbating diseases include diabetes and obesity. He has no history of chronic renal disease, hypothyroidism, liver disease or nephrotic syndrome. Factors aggravating his hyperlipidemia include no known factors. Pertinent negatives include no chest pain, focal sensory loss, focal weakness, leg pain, myalgias or shortness of breath. The current treatment provides no improvement of lipids.  Hypertension This is a chronic problem. The current episode started more than 1 year ago. The problem has been gradually  worsening since onset. The problem is uncontrolled. Pertinent negatives include no anxiety, blurred vision, chest pain, headaches, malaise/fatigue, neck pain, orthopnea, palpitations, peripheral edema, PND, shortness of breath or sweats. There are no associated agents to hypertension. Past treatments include nothing. Identifiable causes of hypertension include sleep apnea. There is no history of chronic renal disease.      Review of Systems  Constitutional: Positive for fatigue. Negative for fever, chills, weight loss, malaise/fatigue, diaphoresis, activity change, appetite change and unexpected weight change.  HENT: Negative.  Negative for neck pain.   Eyes: Negative.  Negative for blurred vision.  Respiratory: Positive for apnea (and snoring). Negative for cough, choking, chest tightness, shortness of breath, wheezing and stridor.   Cardiovascular: Negative for chest pain, palpitations, orthopnea, leg swelling and PND.  Gastrointestinal: Negative for nausea, vomiting, abdominal pain, diarrhea, constipation, blood in stool and abdominal distention.  Genitourinary: Negative.  Negative for polyuria.  Musculoskeletal: Negative for myalgias, back pain, joint swelling, arthralgias and gait problem.  Skin: Negative for color change, pallor, rash and wound.  Neurological: Negative for dizziness, tremors, focal weakness, seizures, syncope, facial asymmetry, speech difficulty, weakness, light-headedness, numbness and headaches.  Hematological: Negative for polydipsia, polyphagia and adenopathy. Does not bruise/bleed easily.  Psychiatric/Behavioral: Negative.        Objective:   Physical Exam  Vitals reviewed. Constitutional: He is oriented to person, place, and time. He appears well-developed and well-nourished. No distress.  HENT:  Head: Normocephalic and atraumatic.  Mouth/Throat: Oropharynx is clear and moist. No oropharyngeal exudate.  Eyes: Conjunctivae are normal. Right eye exhibits no  discharge. Left eye exhibits no discharge. No scleral icterus.  Neck: Normal range of motion. Neck supple. No JVD present. No tracheal deviation present. No thyromegaly present.  Cardiovascular: Normal  rate, regular rhythm, normal heart sounds and intact distal pulses.  Exam reveals no gallop and no friction rub.   No murmur heard. Pulmonary/Chest: Effort normal and breath sounds normal. No stridor. No respiratory distress. He has no wheezes. He has no rales. He exhibits no tenderness.  Abdominal: Soft. Bowel sounds are normal. He exhibits no distension and no mass. There is no tenderness. There is no rebound and no guarding.  Musculoskeletal: Normal range of motion. He exhibits no edema and no tenderness.  Lymphadenopathy:    He has no cervical adenopathy.  Neurological: He is oriented to person, place, and time.  Skin: Skin is warm and dry. No rash noted. He is not diaphoretic. No erythema. No pallor.  Psychiatric: He has a normal mood and affect. His behavior is normal. Judgment and thought content normal.          Lab Results  Component Value Date   WBC 10.0 02/21/2011   HGB 14.4 02/21/2011   HCT 42.5 02/21/2011   PLT 239.0 02/21/2011   GLUCOSE 326* 03/05/2011   CHOL 204* 10/22/2010   TRIG 171.0* 10/22/2010   HDL 38.40* 10/22/2010   LDLDIRECT 145.8 10/22/2010   ALT 33 03/05/2011   AST 26 03/05/2011   NA 138 03/05/2011   K 4.2 03/05/2011   CL 101 03/05/2011   CREATININE 0.9 03/05/2011   BUN 13 03/05/2011   CO2 29 03/05/2011   TSH 0.97 10/22/2010   PSA 0.32 06/10/2010   HGBA1C 13.1* 05/30/2011   Assessment & Plan:

## 2011-11-17 NOTE — Patient Instructions (Signed)

## 2011-11-17 NOTE — Assessment & Plan Note (Signed)
I have asked him to have his OSA re-evaluated, also I will check his testosterone and other labs to look for secondary causes

## 2011-11-17 NOTE — Assessment & Plan Note (Signed)
I will check his lytes and renal function today and I have asked him to start benicar

## 2011-11-17 NOTE — Assessment & Plan Note (Signed)
Start crestor 

## 2011-11-17 NOTE — Assessment & Plan Note (Signed)
Check his a1c today 

## 2011-11-17 NOTE — Assessment & Plan Note (Signed)
He is having some abd cramping from taking metformin QID so I have asked him to change to kombiglyxe-er, I will check his a1c and his renal function today and will look at his a/c ratio as well

## 2011-11-17 NOTE — Assessment & Plan Note (Signed)
He needs an updated eval of his CPAP

## 2011-11-18 ENCOUNTER — Telehealth: Payer: Self-pay

## 2011-11-18 DIAGNOSIS — E119 Type 2 diabetes mellitus without complications: Secondary | ICD-10-CM

## 2011-11-18 DIAGNOSIS — I10 Essential (primary) hypertension: Secondary | ICD-10-CM

## 2011-11-18 LAB — COMPREHENSIVE METABOLIC PANEL
ALT: 32 U/L (ref 0–53)
AST: 26 U/L (ref 0–37)
Albumin: 4.4 g/dL (ref 3.5–5.2)
CO2: 27 mEq/L (ref 19–32)
Calcium: 9.4 mg/dL (ref 8.4–10.5)
Chloride: 104 mEq/L (ref 96–112)
Potassium: 4.6 mEq/L (ref 3.5–5.1)

## 2011-11-18 LAB — TSH: TSH: 1.96 u[IU]/mL (ref 0.35–5.50)

## 2011-11-18 LAB — TESTOSTERONE, FREE, TOTAL, SHBG
Sex Hormone Binding: 21 nmol/L (ref 13–71)
Testosterone: 294.51 ng/dL — ABNORMAL LOW (ref 300–890)

## 2011-11-18 LAB — LIPID PANEL: Total CHOL/HDL Ratio: 5

## 2011-11-18 LAB — MICROALBUMIN / CREATININE URINE RATIO: Microalb, Ur: 2 mg/dL — ABNORMAL HIGH (ref 0.0–1.9)

## 2011-11-18 LAB — LDL CHOLESTEROL, DIRECT: Direct LDL: 160.4 mg/dL

## 2011-11-18 NOTE — Telephone Encounter (Signed)
Received fax stating that insurance will not cover benicar w/o PA

## 2011-11-18 NOTE — Telephone Encounter (Signed)
Received fax stating that insurance will not cover benicar w/o PA. Patient must have tried and faile d diovan, azor, tribenzor, or exforge. Please advise

## 2011-11-19 ENCOUNTER — Encounter: Payer: Self-pay | Admitting: Internal Medicine

## 2011-11-19 MED ORDER — VALSARTAN 160 MG PO TABS: 160.0000 mg | ORAL_TABLET | Freq: Every day | ORAL | Status: DC

## 2011-11-19 NOTE — Telephone Encounter (Signed)
Change was made

## 2011-11-19 NOTE — Telephone Encounter (Signed)
Patient notified/LMOVM 

## 2011-12-10 ENCOUNTER — Encounter: Payer: Self-pay | Admitting: Pulmonary Disease

## 2011-12-10 ENCOUNTER — Institutional Professional Consult (permissible substitution): Payer: 59 | Admitting: Pulmonary Disease

## 2011-12-10 ENCOUNTER — Ambulatory Visit (INDEPENDENT_AMBULATORY_CARE_PROVIDER_SITE_OTHER): Payer: 59 | Admitting: Pulmonary Disease

## 2011-12-10 VITALS — BP 138/106 | HR 103 | Temp 99.0°F | Ht 77.0 in | Wt >= 6400 oz

## 2011-12-10 DIAGNOSIS — G4730 Sleep apnea, unspecified: Secondary | ICD-10-CM

## 2011-12-10 NOTE — Patient Instructions (Signed)
Will schedule for a home sleep study to see if you still have sleep apnea after your large weight loss.  Will call you with results. Keep working on further weight loss.

## 2011-12-10 NOTE — Progress Notes (Signed)
  Subjective:    Patient ID: Stephen Dunlap, male    DOB: [birth year], 40 y.o.   MRN: [id number]  HPI The patient is a 41 year old male who I've been asked to see for management of obstructive sleep apnea.  He was diagnosed in 01-13-01 with sleep apnea of unknown severity, and started on CPAP successfully.  He wore the CPAP compliance we can felt it significantly improved his sleep and daytime alertness.  However, the patient quit using CPAP Jan 14, 2007 after his wife died.  Since that time, he has been noted to have loud snoring as well as an abnormal breathing pattern during sleep.  The patient states it is hard to determine the restfulness of his sleep because of working rotating shifts.  The patient has noted significant sleep pressure during the day with periods of inactivity, and also some sleep pressure with driving.  The patient states that his weight is actually down 16 pounds from his original sleep study, and his Epworth sleepiness score today is 3  Sleep Questionnaire: What time do you typically go to bed?( Between what hours) 10:30pm if on day shift and 8pm if on night shift How long does it take you to fall asleep? 30 minutes How many times during the night do you wake up? 1 What time do you get out of bed to start your day? 4782 Do you drive or operate heavy machinery in your occupation? How much has your weight changed (up or down) over the past two years? (In pounds) 60 lb (27.216 kg) Have you ever had a sleep study before? Yes If yes, location of study? If yes, date of study? 01/13/2001 Do you currently use CPAP? No Do you wear oxygen at any time? No    Review of Systems  Constitutional: Negative.  Negative for fever and unexpected weight change.  HENT: Positive for congestion and postnasal drip. Negative for ear pain, nosebleeds, sore throat, rhinorrhea, sneezing, trouble swallowing, dental problem and sinus pressure.   Eyes: Negative.  Negative for redness and itching.  Respiratory: Positive for cough.  Negative for chest tightness, shortness of breath and wheezing.   Cardiovascular: Negative.  Negative for palpitations and leg swelling.  Gastrointestinal: Negative.  Negative for nausea and vomiting.  Genitourinary: Negative.  Negative for dysuria.  Musculoskeletal: Negative.  Negative for joint swelling.  Skin: Negative.  Negative for rash.  Neurological: Negative.  Negative for headaches.  Hematological: Negative.  Does not bruise/bleed easily.  Psychiatric/Behavioral: Negative.  Negative for dysphoric mood. The patient is not nervous/anxious.        Objective:   Physical Exam Constitutional:  Obese male, no acute distress  HENT:  Nares patent without discharge  Oropharynx without exudate, palate and uvula are moderately thickened and elongated.  Eyes:  Perrla, eomi, no scleral icterus  Neck:  No JVD, no TMG  Cardiovascular:  Normal rate, regular rhythm, no rubs or gallops.  No murmurs        Intact distal pulses  Pulmonary :  Normal breath sounds, no stridor or respiratory distress   No rales, rhonchi, or wheezing  Abdominal:  Soft, nondistended, bowel sounds present.  No tenderness noted.   Musculoskeletal:  1+ lower extremity edema noted.  Lymph Nodes:  No cervical lymphadenopathy noted  Skin:  No cyanosis noted  Neurologic:  Alert, appropriate, moves all 4 extremities without obvious deficit.         Assessment & Plan:

## 2011-12-10 NOTE — Assessment & Plan Note (Signed)
The patient has a history of obstructive sleep apnea diagnosed in 01-15-2001, and was started on CPAP which helped him significantly.  He has not used CPAP since his wife died in 01-16-07, and he has also lost over 60 pounds since that time.  His history is very suggestive of persistent sleep apnea, and with his underlying comorbid medical problems, I think it is worth treating him aggressively.  We'll schedule him for home sleep testing to verify if he still has sleep apnea after his significant weight loss, and will discuss the results with him.

## 2011-12-18 ENCOUNTER — Encounter: Payer: 59 | Attending: Internal Medicine | Admitting: *Deleted

## 2011-12-18 ENCOUNTER — Ambulatory Visit: Payer: 59 | Admitting: *Deleted

## 2011-12-18 ENCOUNTER — Encounter: Payer: Self-pay | Admitting: *Deleted

## 2011-12-18 VITALS — Ht 77.0 in | Wt >= 6400 oz

## 2011-12-18 DIAGNOSIS — Z713 Dietary counseling and surveillance: Secondary | ICD-10-CM | POA: Insufficient documentation

## 2011-12-18 DIAGNOSIS — E119 Type 2 diabetes mellitus without complications: Secondary | ICD-10-CM | POA: Insufficient documentation

## 2011-12-18 DIAGNOSIS — E669 Obesity, unspecified: Secondary | ICD-10-CM | POA: Insufficient documentation

## 2011-12-18 DIAGNOSIS — E1169 Type 2 diabetes mellitus with other specified complication: Secondary | ICD-10-CM

## 2011-12-18 NOTE — Patient Instructions (Addendum)
3.5 - 3.7 mph on treadmill for 45 min and increase as able over time Aim for 4 carb choices per meal time 3 meals Snacks as needed, 1-2 carb choice , protein with snacks Subtract 1-2 protein shakes Limit fat to around 10 grams per meal/snack 2 eggs and rest of breakfast as reported, check carb in protein shake- consider drinking shake after workout Check greek yogurt for carbs Use artifical sweeteners instead of sugars calorie king book or app

## 2011-12-18 NOTE — Progress Notes (Signed)
  Medical Nutrition Therapy:  Appt start time: 0800 end time:  0900.   Assessment:  Primary concerns today: diabetes.   MEDICATIONS: see list   DIETARY INTAKE:  Usual eating pattern includes 3 meals and 2-3 snacks per day.  24-hr recall:  B ( AM): 4 boiled eggs, 1 cup oatmeal with 1 pat butter; protein shake made with water with peanut butter and 1/2 banana Snk ( AM): L ( PM): 2 chicken breast and no yokes noodles (1 cup) 1 cup green peas with water Snk ( PM): fruit on the bottom greek yogurt with 10-12 almonds with water Snack :another protein shake D ( PM): roasted meat, 1 piece bread, vegetable with water Snk ( PM): yogurt or granola bar or protein shake Beverages: water, protein shake, not may sodas, no tea, some juices, muscle milk 2 times a day  Usual physical activity: go to gym daily- 1 hour walking on treadmill and lift weights for 90 min  Estimated energy needs: 2100-2500 calories 235 g carbohydrates 158-180 g protein 58-70 g fat  Progress Towards Goal(s):  In progress.   Nutritional Diagnosis:  NB-1.1 Food and nutrition-related knowledge deficit As related to carbohydrate-containing foods.  As evidenced by recent diagnosis of diabetes.    Intervention:  Nutrition counseling provided.  Discussed etiology of diabetes and role of obesity on insulin resistance.  Discussed carbohydrate-containing foods and how to count carbs.  Discussed need to weight reduction to improve GBS and how to increase cardiovascular exercise intensity to boost weight loss.  Discussed portion control and protein recommendations.  Touched briefly on limiting dietary fat  Handouts given during visit include: Carb Counting and Food Label handouts Meal Plan Card  Plan: 3.5 - 3.7 mph on treadmill for 45 min and increase as able over time Aim for 4 carb choices per meal time 3 meals Snacks as needed, 1-2 carb choice , protein with snacks Subtract 1-2 protein shakes Limit fat to around 10 grams  per meal/snack 2 eggs and rest of breakfast as reported, check carb in protein shake- consider drinking shake after workout Check greek yogurt for carbs Use artifical sweeteners instead of sugars calorie king book or app  Monitoring/Evaluation:  Dietary intake, exercise, portion control, and body weight in 4 week(s).

## 2011-12-31 ENCOUNTER — Other Ambulatory Visit: Payer: Self-pay | Admitting: Pulmonary Disease

## 2011-12-31 ENCOUNTER — Telehealth: Payer: Self-pay | Admitting: Pulmonary Disease

## 2011-12-31 DIAGNOSIS — G4733 Obstructive sleep apnea (adult) (pediatric): Secondary | ICD-10-CM

## 2011-12-31 NOTE — Telephone Encounter (Signed)
Please let pt know that he still has severe sleep apnea, and needs to keep using cpap religiously. Will send order to dme asking them to set up, and he needs to see me back in 6 weeks.

## 2012-01-01 ENCOUNTER — Ambulatory Visit (INDEPENDENT_AMBULATORY_CARE_PROVIDER_SITE_OTHER): Payer: 59 | Admitting: Pulmonary Disease

## 2012-01-01 DIAGNOSIS — G4730 Sleep apnea, unspecified: Secondary | ICD-10-CM

## 2012-01-01 DIAGNOSIS — G4733 Obstructive sleep apnea (adult) (pediatric): Secondary | ICD-10-CM

## 2012-01-01 NOTE — Telephone Encounter (Signed)
The patient is aware and is waiting to hear from [facility]. He is scheduled for f/u with KC on 11/14/11.

## 2012-01-20 ENCOUNTER — Ambulatory Visit: Payer: 59 | Admitting: *Deleted

## 2012-01-21 ENCOUNTER — Ambulatory Visit: Payer: 59 | Admitting: *Deleted

## 2012-02-13 ENCOUNTER — Ambulatory Visit: Payer: 59 | Admitting: Pulmonary Disease

## 2012-02-29 ENCOUNTER — Other Ambulatory Visit: Payer: Self-pay | Admitting: Pulmonary Disease

## 2012-02-29 DIAGNOSIS — G4733 Obstructive sleep apnea (adult) (pediatric): Secondary | ICD-10-CM

## 2012-04-06 IMAGING — CR DG CHEST 2V
2 series · 2 of 2 positions shown · non-contrast
Comparison: Plain films of the chest 03/05/2011 and 06/09/2008.

CLINICAL DATA: Cough and chest pain.

CHEST - 2 VIEW

[view not recorded (1 of 2)]
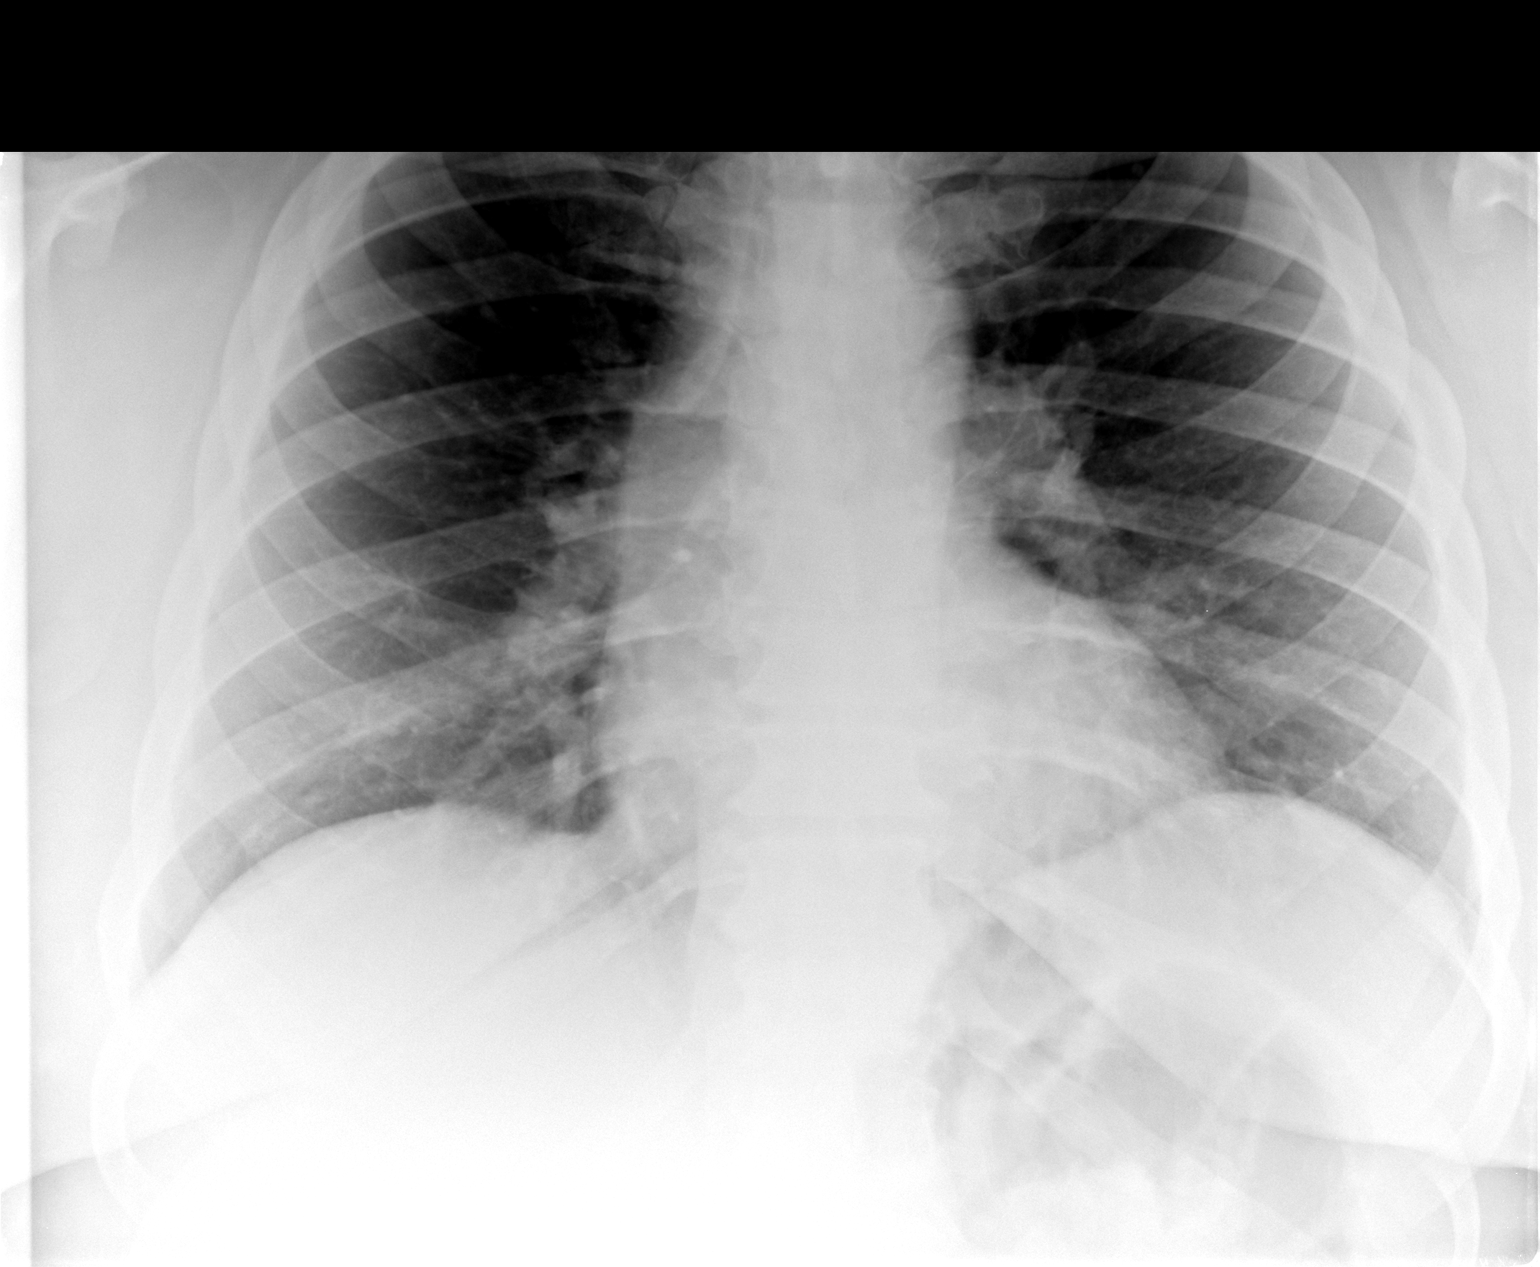

[view not recorded (2 of 2)]
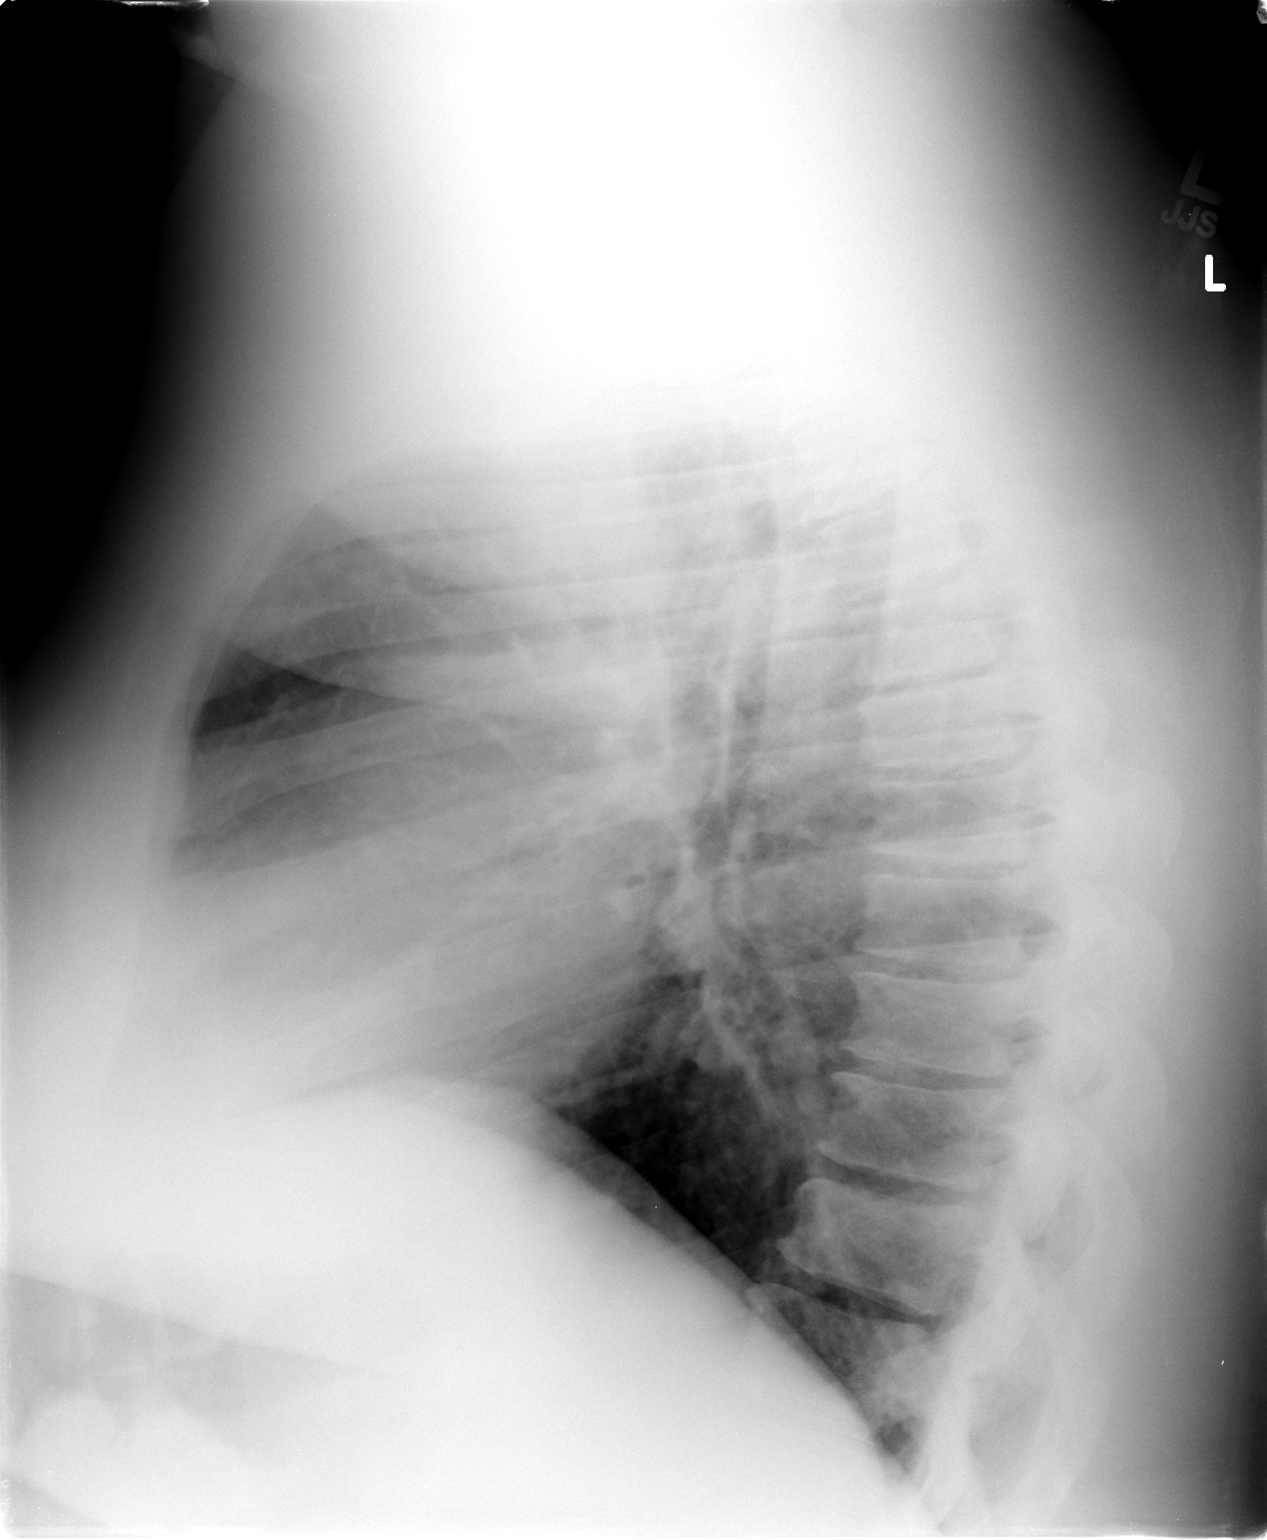

[2 of 2 positions shown; findings below may reference images not displayed]

FINDINGS: The lungs are clear.  Heart size is normal.  No
pneumothorax or pleural effusion.
IMPRESSION: No acute disease.

## 2012-07-07 ENCOUNTER — Other Ambulatory Visit: Payer: Self-pay | Admitting: Endocrinology

## 2012-07-08 ENCOUNTER — Encounter: Payer: Self-pay | Admitting: Internal Medicine

## 2012-07-08 ENCOUNTER — Other Ambulatory Visit (INDEPENDENT_AMBULATORY_CARE_PROVIDER_SITE_OTHER): Payer: 59

## 2012-07-08 ENCOUNTER — Ambulatory Visit (INDEPENDENT_AMBULATORY_CARE_PROVIDER_SITE_OTHER): Payer: 59 | Admitting: Internal Medicine

## 2012-07-08 VITALS — BP 120/82 | HR 86 | Temp 98.8°F | Resp 16 | Wt >= 6400 oz

## 2012-07-08 DIAGNOSIS — I10 Essential (primary) hypertension: Secondary | ICD-10-CM

## 2012-07-08 DIAGNOSIS — E1169 Type 2 diabetes mellitus with other specified complication: Secondary | ICD-10-CM

## 2012-07-08 DIAGNOSIS — R5381 Other malaise: Secondary | ICD-10-CM

## 2012-07-08 DIAGNOSIS — E669 Obesity, unspecified: Secondary | ICD-10-CM

## 2012-07-08 DIAGNOSIS — E119 Type 2 diabetes mellitus without complications: Secondary | ICD-10-CM

## 2012-07-08 DIAGNOSIS — E7849 Other hyperlipidemia: Secondary | ICD-10-CM

## 2012-07-08 DIAGNOSIS — E785 Hyperlipidemia, unspecified: Secondary | ICD-10-CM

## 2012-07-08 DIAGNOSIS — R5383 Other fatigue: Secondary | ICD-10-CM

## 2012-07-08 LAB — CBC WITH DIFFERENTIAL/PLATELET
Eosinophils Absolute: 0.1 10*3/uL (ref 0.0–0.7)
Lymphs Abs: 3.4 10*3/uL (ref 0.7–4.0)
MCHC: 33.7 g/dL (ref 30.0–36.0)
MCV: 83.8 fl (ref 78.0–100.0)
Monocytes Absolute: 0.6 10*3/uL (ref 0.1–1.0)
Neutrophils Relative %: 57.9 % (ref 43.0–77.0)
Platelets: 228 10*3/uL (ref 150.0–400.0)
RDW: 13.7 % (ref 11.5–14.6)
WBC: 10 10*3/uL (ref 4.5–10.5)

## 2012-07-08 LAB — LIPID PANEL
Cholesterol: 216 mg/dL — ABNORMAL HIGH (ref 0–200)
Triglycerides: 198 mg/dL — ABNORMAL HIGH (ref 0.0–149.0)

## 2012-07-08 LAB — COMPREHENSIVE METABOLIC PANEL
ALT: 28 U/L (ref 0–53)
AST: 17 U/L (ref 0–37)
Albumin: 4.3 g/dL (ref 3.5–5.2)
Alkaline Phosphatase: 89 U/L (ref 39–117)
Glucose, Bld: 219 mg/dL — ABNORMAL HIGH (ref 70–99)
Potassium: 4.1 mEq/L (ref 3.5–5.1)
Sodium: 136 mEq/L (ref 135–145)
Total Protein: 7.5 g/dL (ref 6.0–8.3)

## 2012-07-08 LAB — HM DIABETES FOOT EXAM: HM Diabetic Foot Exam: NORMAL

## 2012-07-08 LAB — GLUCOSE, POCT (MANUAL RESULT ENTRY): POC Glucose: 199 mg/dl — AB (ref 70–99)

## 2012-07-08 LAB — URINALYSIS, ROUTINE W REFLEX MICROSCOPIC
Bilirubin Urine: NEGATIVE
Hgb urine dipstick: NEGATIVE
Nitrite: NEGATIVE
Total Protein, Urine: NEGATIVE

## 2012-07-08 LAB — HEMOGLOBIN A1C: Hgb A1c MFr Bld: 13 % — ABNORMAL HIGH (ref 4.6–6.5)

## 2012-07-08 MED ORDER — SAXAGLIPTIN-METFORMIN ER 5-1000 MG PO TB24: 1.0000 | ORAL_TABLET | Freq: Every day | ORAL | Status: DC

## 2012-07-08 MED ORDER — CANAGLIFLOZIN 300 MG PO TABS: 1.0000 | ORAL_TABLET | Freq: Every day | ORAL | Status: DC

## 2012-07-08 NOTE — Assessment & Plan Note (Signed)
He is doing well on crestor 

## 2012-07-08 NOTE — Patient Instructions (Signed)

## 2012-07-08 NOTE — Assessment & Plan Note (Signed)
Continue kombiglyze Start Invokana I will check his a1c and his renal function today

## 2012-07-08 NOTE — Assessment & Plan Note (Signed)
His BP is well controlled 

## 2012-07-08 NOTE — Assessment & Plan Note (Signed)
This is a persistent problem for him, I do not see any specific medical illness other than his OSA, obesity, very poor conditioning

## 2012-07-08 NOTE — Progress Notes (Signed)
Subjective:    Patient ID: Stephen Dunlap, male    DOB: [birth year], 41 y.o.   MRN: [id number]  Diabetes He presents for his follow-up diabetic visit. He has type 2 diabetes mellitus. His disease course has been stable. There are no hypoglycemic associated symptoms. Pertinent negatives for hypoglycemia include no dizziness, headaches, pallor, seizures, speech difficulty or tremors. Associated symptoms include fatigue, polydipsia, polyphagia and polyuria. Pertinent negatives for diabetes include no blurred vision, no chest pain, no foot paresthesias, no foot ulcerations, no visual change, no weakness and no weight loss. There are no hypoglycemic complications. Symptoms are stable. There are no diabetic complications. Current diabetic treatment includes oral agent (monotherapy). He is compliant with treatment all of the time. His weight is stable. He is following a generally unhealthy diet. When asked about meal planning, he reported none. He has not had a previous visit with a dietician. He never participates in exercise. There is no change in his home blood glucose trend. His breakfast blood glucose range is generally >200 mg/dl. His lunch blood glucose range is generally >200 mg/dl. His dinner blood glucose range is generally >200 mg/dl. His highest blood glucose is >200 mg/dl. His overall blood glucose range is >200 mg/dl. An ACE inhibitor/angiotensin II receptor blocker is not being taken. He does not see a podiatrist.Eye exam is current.      Review of Systems  Constitutional: Positive for fatigue. Negative for fever, chills, weight loss, diaphoresis, activity change, appetite change and unexpected weight change.  HENT: Negative.   Eyes: Negative.  Negative for blurred vision.  Respiratory: Negative for cough, chest tightness, shortness of breath, wheezing and stridor.   Cardiovascular: Negative for chest pain, palpitations and leg swelling.  Gastrointestinal: Negative for nausea, vomiting,  abdominal pain, diarrhea, blood in stool and anal bleeding.  Genitourinary: Positive for polyuria and frequency. Negative for dysuria, urgency, flank pain, decreased urine volume, enuresis and difficulty urinating.  Musculoskeletal: Negative for myalgias, back pain, joint swelling, arthralgias and gait problem.  Skin: Negative for color change, pallor, rash and wound.  Neurological: Negative for dizziness, tremors, seizures, syncope, facial asymmetry, speech difficulty, weakness, light-headedness, numbness and headaches.  Hematological: Positive for polydipsia and polyphagia. Negative for adenopathy. Does not bruise/bleed easily.  Psychiatric/Behavioral: Negative.        Objective:   Physical Exam  Vitals reviewed. Constitutional: He is oriented to person, place, and time. He appears well-developed and well-nourished. No distress.  HENT:  Head: Normocephalic and atraumatic.  Mouth/Throat: Oropharynx is clear and moist. No oropharyngeal exudate.  Eyes: Conjunctivae normal are normal. Right eye exhibits no discharge. Left eye exhibits no discharge. No scleral icterus.  Neck: Normal range of motion. Neck supple. No JVD present. No tracheal deviation present. No thyromegaly present.  Cardiovascular: Normal rate, regular rhythm, normal heart sounds and intact distal pulses.  Exam reveals no gallop and no friction rub.   No murmur heard. Pulmonary/Chest: Effort normal and breath sounds normal. No stridor. No respiratory distress. He has no wheezes. He has no rales. He exhibits no tenderness.  Abdominal: Soft. Bowel sounds are normal. He exhibits no distension and no mass. There is no tenderness. There is no rebound and no guarding.  Musculoskeletal: Normal range of motion. He exhibits no edema and no tenderness.  Lymphadenopathy:    He has no cervical adenopathy.  Neurological: He is oriented to person, place, and time.  Skin: Skin is warm and dry. No rash noted. He is not diaphoretic. No  erythema. No pallor.  Psychiatric: He has a normal mood and affect. His behavior is normal. Judgment and thought content normal.     Lab Results  Component Value Date   WBC 8.6 11/17/2011   HGB 13.9 11/17/2011   HCT 42.5 11/17/2011   PLT 249.0 11/17/2011   GLUCOSE 182* 11/17/2011   CHOL 217* 11/17/2011   TRIG 116.0 11/17/2011   HDL 45.70 11/17/2011   LDLDIRECT 160.4 11/17/2011   ALT 32 11/17/2011   AST 26 11/17/2011   NA 140 11/17/2011   K 4.6 11/17/2011   CL 104 11/17/2011   CREATININE 0.9 11/17/2011   BUN 14 11/17/2011   CO2 27 11/17/2011   TSH 1.96 11/17/2011   PSA 0.32 06/10/2010   HGBA1C 8.7* 11/17/2011   MICROALBUR 2.0* 11/17/2011       Assessment & Plan:

## 2012-07-28 HISTORY — PX: HERNIA REPAIR: SHX51

## 2012-12-02 ENCOUNTER — Ambulatory Visit (INDEPENDENT_AMBULATORY_CARE_PROVIDER_SITE_OTHER): Payer: 59

## 2012-12-02 ENCOUNTER — Encounter: Payer: Self-pay | Admitting: Internal Medicine

## 2012-12-02 ENCOUNTER — Ambulatory Visit (INDEPENDENT_AMBULATORY_CARE_PROVIDER_SITE_OTHER): Payer: 59 | Admitting: Internal Medicine

## 2012-12-02 VITALS — BP 146/98 | HR 90 | Temp 98.5°F | Resp 16 | Wt >= 6400 oz

## 2012-12-02 DIAGNOSIS — E785 Hyperlipidemia, unspecified: Secondary | ICD-10-CM

## 2012-12-02 DIAGNOSIS — IMO0001 Reserved for inherently not codable concepts without codable children: Secondary | ICD-10-CM

## 2012-12-02 DIAGNOSIS — N478 Other disorders of prepuce: Secondary | ICD-10-CM

## 2012-12-02 DIAGNOSIS — E7849 Other hyperlipidemia: Secondary | ICD-10-CM

## 2012-12-02 DIAGNOSIS — Z0000 Encounter for general adult medical examination without abnormal findings: Secondary | ICD-10-CM | POA: Insufficient documentation

## 2012-12-02 DIAGNOSIS — I10 Essential (primary) hypertension: Secondary | ICD-10-CM

## 2012-12-02 DIAGNOSIS — N476 Balanoposthitis: Secondary | ICD-10-CM

## 2012-12-02 DIAGNOSIS — N4889 Other specified disorders of penis: Secondary | ICD-10-CM

## 2012-12-02 DIAGNOSIS — N481 Balanitis: Secondary | ICD-10-CM

## 2012-12-02 LAB — URINALYSIS, ROUTINE W REFLEX MICROSCOPIC
Leukocytes, UA: NEGATIVE
Nitrite: NEGATIVE
Specific Gravity, Urine: >=1.03 (ref 1.000–1.030)
pH: 6 (ref 5.0–8.0)

## 2012-12-02 LAB — HEMOGLOBIN A1C: Hgb A1c MFr Bld: 8 % — ABNORMAL HIGH (ref 4.6–6.5)

## 2012-12-02 LAB — COMPREHENSIVE METABOLIC PANEL
Albumin: 4.3 g/dL (ref 3.5–5.2)
Alkaline Phosphatase: 69 U/L (ref 39–117)
BUN: 13 mg/dL (ref 6–23)
Calcium: 9.6 mg/dL (ref 8.4–10.5)
Chloride: 105 mEq/L (ref 96–112)
Creatinine, Ser: 0.9 mg/dL (ref 0.4–1.5)
Glucose, Bld: 114 mg/dL — ABNORMAL HIGH (ref 70–99)
Potassium: 4.5 mEq/L (ref 3.5–5.1)

## 2012-12-02 LAB — CBC WITH DIFFERENTIAL/PLATELET
Eosinophils Absolute: 0.2 10*3/uL (ref 0.0–0.7)
HCT: 44.5 % (ref 39.0–52.0)
Lymphocytes Relative: 29 % (ref 12.0–46.0)
Lymphs Abs: 2.4 10*3/uL (ref 0.7–4.0)
MCHC: 33 g/dL (ref 30.0–36.0)
Monocytes Relative: 6 % (ref 3.0–12.0)
Platelets: 257 10*3/uL (ref 150.0–400.0)
RDW: 14 % (ref 11.5–14.6)

## 2012-12-02 LAB — TSH: TSH: 1.78 u[IU]/mL (ref 0.35–5.50)

## 2012-12-02 MED ORDER — KETOCONAZOLE 2 % EX CREA: TOPICAL_CREAM | Freq: Every day | CUTANEOUS | Status: DC

## 2012-12-02 MED ORDER — OLMESARTAN MEDOXOMIL-HCTZ 20-12.5 MG PO TABS: 1.0000 | ORAL_TABLET | Freq: Every day | ORAL | Status: DC

## 2012-12-02 MED ORDER — SITAGLIPTIN PHOS-METFORMIN HCL 50-1000 MG PO TABS: 1.0000 | ORAL_TABLET | Freq: Two times a day (BID) | ORAL | Status: DC

## 2012-12-02 NOTE — Assessment & Plan Note (Signed)
FLP today 

## 2012-12-02 NOTE — Assessment & Plan Note (Signed)
Will start ketoconazole cream

## 2012-12-02 NOTE — Assessment & Plan Note (Signed)
He will have to stop invokana I will increase his meds with janumet Today I will check his A1C and his renal function He is due for a f/up with ENDO

## 2012-12-02 NOTE — Progress Notes (Signed)
Subjective:    Patient ID: Stephen Dunlap, male    DOB: [birth year], 42 y.o.   MRN: [id number]  Diabetes He presents for his follow-up diabetic visit. He has type 2 diabetes mellitus. His disease course has been worsening. There are no hypoglycemic associated symptoms. Pertinent negatives for hypoglycemia include no dizziness or tremors. Associated symptoms include polydipsia, polyphagia and polyuria. Pertinent negatives for diabetes include no blurred vision, no chest pain, no fatigue, no foot paresthesias, no foot ulcerations, no visual change, no weakness and no weight loss. There are no hypoglycemic complications. Current diabetic treatment includes oral agent (triple therapy). He is compliant with treatment some of the time. His weight is increasing steadily. He is following a generally unhealthy diet. When asked about meal planning, he reported none. He never participates in exercise. There is no change in his home blood glucose trend. An ACE inhibitor/angiotensin II receptor blocker is not being taken. He does not see a podiatrist.Eye exam is current.      Review of Systems  Constitutional: Negative.  Negative for fever, chills, weight loss, diaphoresis, activity change, appetite change, fatigue and unexpected weight change.  HENT: Negative.   Eyes: Negative.  Negative for blurred vision.  Respiratory: Positive for apnea. Negative for cough, choking, chest tightness, shortness of breath and wheezing.   Cardiovascular: Negative.  Negative for chest pain, palpitations and leg swelling.  Gastrointestinal: Negative.  Negative for nausea, vomiting, abdominal pain, diarrhea and constipation.  Endocrine: Positive for polydipsia, polyphagia and polyuria.  Genitourinary: Positive for penile swelling (forskin feels red and irritated, there is a lesion on the left side). Negative for dysuria, urgency, frequency, hematuria, flank pain, decreased urine volume, discharge, scrotal swelling, enuresis,  difficulty urinating, genital sores, penile pain and testicular pain.  Musculoskeletal: Negative.   Skin: Negative.   Allergic/Immunologic: Negative.   Neurological: Negative.  Negative for dizziness, tremors, syncope, weakness and light-headedness.  Hematological: Negative.  Negative for adenopathy. Does not bruise/bleed easily.  Psychiatric/Behavioral: Negative.        Objective:   Physical Exam  Vitals reviewed. Constitutional: He is oriented to person, place, and time. He appears well-developed and well-nourished. No distress.  HENT:  Head: Normocephalic and atraumatic.  Mouth/Throat: Oropharynx is clear and moist. No oropharyngeal exudate.  Eyes: Conjunctivae are normal. Right eye exhibits no discharge. Left eye exhibits no discharge. No scleral icterus.  Neck: Normal range of motion. Neck supple. No JVD present. No tracheal deviation present. No thyromegaly present.  Cardiovascular: Normal rate, regular rhythm, normal heart sounds and intact distal pulses.  Exam reveals no gallop and no friction rub.   No murmur heard. Pulmonary/Chest: Effort normal and breath sounds normal. No stridor. No respiratory distress. He has no wheezes. He has no rales. He exhibits no tenderness.  Abdominal: Soft. Bowel sounds are normal. He exhibits no distension and no mass. There is no tenderness. There is no rebound and no guarding. Hernia confirmed negative in the right inguinal area and confirmed negative in the left inguinal area.  Genitourinary: Rectum normal, testes normal and penis normal. Rectal exam shows no external hemorrhoid, no internal hemorrhoid, no fissure, no mass, no tenderness and anal tone normal. Guaiac negative stool. Prostate is enlarged (1+ smooth symm BPH). Prostate is not tender. Right testis shows no mass, no swelling and no tenderness. Right testis is descended. Left testis shows no mass, no swelling and no tenderness. Left testis is descended. Uncircumcised. No phimosis,  paraphimosis, hypospadias, penile erythema or penile tenderness. No discharge found.  The foreskin is erythematous and diffusely macerated, over the left side of the prepuce there is a linear horizontal raised lesion with some elements of ulceration and some hyperkeratotic components.  Musculoskeletal: Normal range of motion. He exhibits no edema and no tenderness.  Lymphadenopathy:    He has no cervical adenopathy.       Right: No inguinal adenopathy present.       Left: No inguinal adenopathy present.  Neurological: He is oriented to person, place, and time.  Skin: Skin is warm and dry. No rash noted. He is not diaphoretic. No erythema. No pallor.  Psychiatric: He has a normal mood and affect. His behavior is normal. Judgment and thought content normal.     Lab Results  Component Value Date   WBC 10.0 07/08/2012   HGB 14.3 07/08/2012   HCT 42.5 07/08/2012   PLT 228.0 07/08/2012   GLUCOSE 219* 07/08/2012   CHOL 216* 07/08/2012   TRIG 198.0* 07/08/2012   HDL 35.20* 07/08/2012   LDLDIRECT 170.8 07/08/2012   ALT 28 07/08/2012   AST 17 07/08/2012   NA 136 07/08/2012   K 4.1 07/08/2012   CL 104 07/08/2012   CREATININE 0.9 07/08/2012   BUN 11 07/08/2012   CO2 26 07/08/2012   TSH 1.38 07/08/2012   PSA 0.32 06/10/2010   HGBA1C 13.0* 07/08/2012   MICROALBUR 2.0* 11/17/2011       Assessment & Plan:

## 2012-12-02 NOTE — Assessment & Plan Note (Signed)
Exam done Vaccines were reviewed Labs ordered Pt ed material was given 

## 2012-12-02 NOTE — Assessment & Plan Note (Signed)
I have asked him to consider bariatric surgery

## 2012-12-02 NOTE — Assessment & Plan Note (Signed)
I have asked him to see urology about the lesion on his foreskin

## 2012-12-02 NOTE — Patient Instructions (Signed)

## 2012-12-02 NOTE — Assessment & Plan Note (Signed)
Will start treatment with benicar-hct Today I will check his lytes and renal fucntion

## 2012-12-03 ENCOUNTER — Telehealth: Payer: Self-pay | Admitting: *Deleted

## 2012-12-03 ENCOUNTER — Encounter: Payer: Self-pay | Admitting: Internal Medicine

## 2012-12-03 LAB — LDL CHOLESTEROL, DIRECT: Direct LDL: 155.6 mg/dL

## 2012-12-03 MED ORDER — COLESEVELAM HCL 3.75 G PO PACK: 1.0000 | PACK | Freq: Every day | ORAL | Status: DC

## 2012-12-03 MED ORDER — CETIRIZINE HCL 10 MG PO TABS: 10.0000 mg | ORAL_TABLET | Freq: Every day | ORAL | Status: DC | PRN

## 2012-12-03 NOTE — Telephone Encounter (Signed)
Pt requesting refill of Zyrtec sent to [facility]. Rx sent to pharmacy.

## 2012-12-03 NOTE — Addendum Note (Signed)
Addended by: Etta Grandchild on: 12/03/2012 10:29 AM   Modules accepted: Orders

## 2012-12-15 ENCOUNTER — Ambulatory Visit (INDEPENDENT_AMBULATORY_CARE_PROVIDER_SITE_OTHER): Payer: 59 | Admitting: Internal Medicine

## 2012-12-15 ENCOUNTER — Encounter: Payer: Self-pay | Admitting: Internal Medicine

## 2012-12-15 ENCOUNTER — Other Ambulatory Visit (INDEPENDENT_AMBULATORY_CARE_PROVIDER_SITE_OTHER): Payer: 59

## 2012-12-15 VITALS — BP 120/80 | HR 101 | Temp 97.9°F | Ht 77.0 in | Wt >= 6400 oz

## 2012-12-15 DIAGNOSIS — I10 Essential (primary) hypertension: Secondary | ICD-10-CM

## 2012-12-15 DIAGNOSIS — IMO0001 Reserved for inherently not codable concepts without codable children: Secondary | ICD-10-CM

## 2012-12-15 DIAGNOSIS — K921 Melena: Secondary | ICD-10-CM | POA: Insufficient documentation

## 2012-12-15 DIAGNOSIS — K5289 Other specified noninfective gastroenteritis and colitis: Secondary | ICD-10-CM

## 2012-12-15 DIAGNOSIS — K529 Noninfective gastroenteritis and colitis, unspecified: Secondary | ICD-10-CM | POA: Insufficient documentation

## 2012-12-15 DIAGNOSIS — J309 Allergic rhinitis, unspecified: Secondary | ICD-10-CM | POA: Insufficient documentation

## 2012-12-15 LAB — CBC WITH DIFFERENTIAL/PLATELET
Eosinophils Relative: 1.6 % (ref 0.0–5.0)
HCT: 47.9 % (ref 39.0–52.0)
Hemoglobin: 16 g/dL (ref 13.0–17.0)
Lymphs Abs: 2.8 10*3/uL (ref 0.7–4.0)
Monocytes Relative: 9.4 % (ref 3.0–12.0)
Neutro Abs: 3.9 10*3/uL (ref 1.4–7.7)
WBC: 7.7 10*3/uL (ref 4.5–10.5)

## 2012-12-15 LAB — HEPATIC FUNCTION PANEL
ALT: 31 U/L (ref 0–53)
AST: 15 U/L (ref 0–37)
Total Bilirubin: 0.2 mg/dL — ABNORMAL LOW (ref 0.3–1.2)

## 2012-12-15 LAB — BASIC METABOLIC PANEL
Calcium: 8.2 mg/dL — ABNORMAL LOW (ref 8.4–10.5)
Creatinine, Ser: 1 mg/dL (ref 0.4–1.5)
GFR: 110.97 mL/min (ref >60.00)
Glucose, Bld: 148 mg/dL — ABNORMAL HIGH (ref 70–99)
Sodium: 139 mEq/L (ref 135–145)

## 2012-12-15 LAB — URINALYSIS, ROUTINE W REFLEX MICROSCOPIC
Bilirubin Urine: NEGATIVE
Hgb urine dipstick: NEGATIVE
Ketones, ur: NEGATIVE
Leukocytes, UA: NEGATIVE
RBC / HPF: NONE SEEN (ref 0–?)
Specific Gravity, Urine: >=1.03 (ref 1.000–1.030)
Urobilinogen, UA: 0.2 (ref 0.0–1.0)

## 2012-12-15 MED ORDER — FEXOFENADINE HCL 180 MG PO TABS: 180.0000 mg | ORAL_TABLET | Freq: Every day | ORAL | Status: DC

## 2012-12-15 MED ORDER — PROMETHAZINE HCL 25 MG PO TABS: 25.0000 mg | ORAL_TABLET | Freq: Four times a day (QID) | ORAL | Status: DC | PRN

## 2012-12-15 NOTE — Assessment & Plan Note (Signed)
Trivial it seems, likely hemorrhoid related, dark color stool likely related to peptobismol, for cbc but hold on other eval for now

## 2012-12-15 NOTE — Assessment & Plan Note (Signed)
Ok for [employer] as needed prn

## 2012-12-15 NOTE — Assessment & Plan Note (Signed)
stable overall by history and exam, recent data reviewed with pt, and pt to continue medical treatment as before,  to f/u any worsening symptoms or concerns Lab Results  Component Value Date   HGBA1C 8.0* 12/02/2012

## 2012-12-15 NOTE — Progress Notes (Signed)
Subjective:    Patient ID: Stephen Dunlap, male    DOB: April 13, 1971, 42 y.o.   MRN: [id number]  HPI  Here with 3-4 days onset low grade temp, crampy abd pains, n/v (but keeping fluids down with phenergan, needs refill), and mult loose/watery stools, not better with peptobismol and stools are now black.  No collard greens,  Seemed to start after a bad corn dog.  No one else ill with similar.  Wife is nurse, concerned about GI bleed, no orthostasis.  Pt denies chest pain, increased sob or doe, wheezing, orthopnea, PND, increased LE swelling, palpitations, dizziness or syncope.  Pt denies new neurological symptoms such as new headache, or facial or extremity weakness or numbness   Pt denies polydipsia, polyuria, or low sugar symptoms such as weakness or confusion improved with po intake.  Denies worsening reflux, dysphagia. No overt bleeding or bruising otherwise. Last colonscopy approx 2 yrs ago, with int hemorrhoid only per pt.  Missed work last 2 days. Does have several wks ongoing nasal allergy symptoms with clearish congestion, itch and sneezing, without fever, pain, ST, cough, swelling or wheezing.  Past Medical History  Diagnosis Date  . Hypertension   . Diabetes mellitus   . Prostatitis   . Morbid obesity   . UTI (urinary tract infection)    Past Surgical History  Procedure Laterality Date  . Hernia repair    . Cysto bedside      reports that he has never smoked. He has never used smokeless tobacco. He reports that he drinks about 1.2 ounces of alcohol per week. He reports that he does not use illicit drugs. family history includes Arthritis in his other; Cancer in his other; Diabetes in his other; Hyperlipidemia in his other; and Hypertension in his other. Allergies  Allergen Reactions  . Invokana (Canagliflozin)     yeast   Current Outpatient Prescriptions on File Prior to Visit  Medication Sig Dispense Refill  . cetirizine (ZYRTEC) 10 MG tablet Take 1 tablet (10 mg total) by mouth  daily as needed for allergies.  30 tablet  3  . Colesevelam HCl 3.75 G PACK Take 1 packet by mouth daily.  90 each  3  . ketoconazole (NIZORAL) 2 % cream Apply topically daily. Apply under the foreskin twice a day  30 g  2  . Multiple Vitamin (MULTIVITAMIN) tablet Take 1 tablet by mouth daily.        . Nutritional Supplements (SOY PROTEIN SHAKE PO) Take by mouth daily. W/gym workout       . olmesartan-hydrochlorothiazide (BENICAR HCT) 20-12.5 MG per tablet Take 1 tablet by mouth daily.  56 tablet  0  . Omeprazole Magnesium (PRILOSEC OTC PO) Take by mouth.        . sitaGLIPtan-metformin (JANUMET) 50-1000 MG per tablet Take 1 tablet by mouth 2 (two) times daily with a meal.  140 tablet  0  . rosuvastatin (CRESTOR) 10 MG tablet Take 10 mg by mouth daily as needed.       No current facility-administered medications on file prior to visit.   Review of Systems  Constitutional: Negative for unexpected weight change, or unusual diaphoresis  HENT: Negative for tinnitus.   Eyes: Negative for photophobia and visual disturbance.  Respiratory: Negative for choking and stridor.   Gastrointestinal: Negative for vomiting and blood in stool.  Genitourinary: Negative for hematuria and decreased urine volume.  Musculoskeletal: Negative for acute joint swelling Skin: Negative for color change and wound.  Neurological: Negative  for tremors and numbness other than noted  Psychiatric/Behavioral: Negative for decreased concentration or  hyperactivity.       Objective:   Physical Exam  BP 120/80  Pulse 101  Temp(Src) 97.9 F (36.6 C) (Oral)  Ht 6\' 5"  (1.956 m)  Wt 402 lb (182.346 kg)  BMI 47.66 kg/m2  SpO2 96% VS noted, fatigued appearing, nontoxic Constitutional: Pt appears well-developed and well-nourished.  HENT: Head: NCAT.  Right Ear: External ear normal.  Left Ear: External ear normal.  Eyes: Conjunctivae and EOM are normal. Pupils are equal, round, and reactive to light.  Neck: Normal range  of motion. Neck supple.  Cardiovascular: Normal rate and regular rhythm.  .Bilat tm's with mild erythema.  Max sinus areas non tender.  Pharynx with mild erythema, no exudate  Pulmonary/Chest: Effort normal and breath sounds normal.  Abd:  Soft, NT, non-distended, + BS except for mild RLQ tender without guarding Neurological: Pt is alert. Not confused , motor intact No synovitis DRE: normal tone, difficult exam due to habitus, no mass, dark stool noted but VERY trace heme pos only Skin: Skin is warm. No erythema/rash  Psychiatric: Pt behavior is normal. Thought content normal.     Assessment & Plan:

## 2012-12-15 NOTE — Patient Instructions (Addendum)
Please take all new medication as prescribed - the phenergan, and the allegra Please continue all other medications as before, but watch for low sugars if not eating well over the next few days You can also use Immodium OTC as needed for diarrhea Please go to the LAB in the Basement (turn left off the elevator) for the tests to be done today You will be contacted by phone if any changes need to be made immediately.  Otherwise, you will receive a letter about your results with an explanation, but please check with MyChart first Thank you for enrolling in MyChart. Please follow the instructions below to securely access your online medical record. MyChart allows you to send messages to your doctor, view your test results, renew your prescriptions, schedule appointments, and more.

## 2012-12-15 NOTE — Assessment & Plan Note (Signed)
stable overall by history and exam, recent data reviewed with pt, and pt to continue medical treatment as before,  to f/u any worsening symptoms or concerns BP Readings from Last 3 Encounters:  12/15/12 120/80  12/02/12 146/98  07/08/12 120/82

## 2012-12-15 NOTE — Assessment & Plan Note (Signed)
Most likely viral, ok for work note if needed, phenergan prn, immodium prn,  to f/u any worsening symptoms or concerns

## 2012-12-16 ENCOUNTER — Ambulatory Visit: Payer: 59 | Admitting: Internal Medicine

## 2012-12-16 DIAGNOSIS — Z0289 Encounter for other administrative examinations: Secondary | ICD-10-CM

## 2012-12-17 ENCOUNTER — Telehealth: Payer: Self-pay | Admitting: Internal Medicine

## 2012-12-17 NOTE — Telephone Encounter (Signed)
Patient Information:  Caller Name: Marylene Land  Phone: [phone]  Patient: Stephen Dunlap, Stephen Dunlap  Gender: Male  DOB: 1971-04-17  Age: 42 Years  PCP: Sanda Linger (Adults only)  Office Follow Up:  Does the office need to follow up with this patient?: Yes  Instructions For The Office: PLS CALL BACK FOR APPT NEEDS, NO SAME DAY REMAINING  RN Note:  Pt was seen in office on 5-21 for Diarrhea, unconfirmed Hemoccult, per Wife. Pt's stool are becoming more jet black in color w/ dizziness upon standing.  FSBS 173 on 5-23.  Pt denies taking Pepto=-Bismol.  Wife is Nurse and feels smell of "Diarrhea smells like a GI Bleed".  Pt is drinking and urinating.  Advised Wife to have Pt seen at ED due to No same day appts.  Wife would like to see if a MD could see him instead.  PLEASE DISCUSS APPT OPTIONS W/ MD AND F/U W/ WIFE.  Symptoms  Reason For Call & Symptoms: Diarrhea, jet black  Reviewed Health History In EMR: Yes  Reviewed Medications In EMR: Yes  Reviewed Allergies In EMR: Yes  Reviewed Surgeries / Procedures: Yes  Date of Onset of Symptoms: 12/12/2012  Treatments Tried: fluids, phenergan  Treatments Tried Worked: No  Guideline(s) Used:  Diarrhea  Disposition Per Guideline:   Go to ED Now (or to Office with PCP Approval)  Reason For Disposition Reached:   Black bowel movements  Advice Given:  N/A  RN Overrode Recommendation:  Make Appointment  SEE RN NOTE

## 2012-12-17 NOTE — Telephone Encounter (Signed)
Let's run this by Dr. Jonny Ruiz and see what he says

## 2012-12-17 NOTE — Telephone Encounter (Signed)
Remember pepto bismol causes black stools, but if not using this, he should go to UC or ER at [facility] now, thanks

## 2012-12-17 NOTE — Telephone Encounter (Signed)
Called and informed the patients wife of MD instructions.  She stated the ER would take all day and that is why they have a PCP.  Stated she would call the administrator of our office at her convenience.

## 2012-12-21 ENCOUNTER — Telehealth: Payer: Self-pay | Admitting: Gastroenterology

## 2012-12-21 DIAGNOSIS — R197 Diarrhea, unspecified: Secondary | ICD-10-CM

## 2012-12-21 NOTE — Telephone Encounter (Signed)
Left message on machine to call back  

## 2012-12-22 NOTE — Telephone Encounter (Signed)
Pt has had food poisoning but the diarrhea has not gotten any better.  The pt's wife is a Engineer, civil (consulting) at [employer] and is concerned because she "smells" blood in his stool, she has not seen any but is concerned.  Pt given appt for 01/14/13 and was put on the wait list.  She also was offered appt with extender but would really like to see Dr Christella Hartigan

## 2012-12-22 NOTE — Telephone Encounter (Signed)
Lets have him get a CBC, send stool for ova, parasites, c diff, routine culture.  Again offer extender visit and keep on wait list for open spots for me, thanks

## 2012-12-22 NOTE — Telephone Encounter (Signed)
Pt wife was notified and appt made with Shanda Bumps for 12/24/12.  He will come in for labs and stool test

## 2012-12-23 ENCOUNTER — Other Ambulatory Visit (INDEPENDENT_AMBULATORY_CARE_PROVIDER_SITE_OTHER): Payer: 59

## 2012-12-23 DIAGNOSIS — R197 Diarrhea, unspecified: Secondary | ICD-10-CM

## 2012-12-23 LAB — CBC WITH DIFFERENTIAL/PLATELET
Basophils Relative: 0.3 % (ref 0.0–3.0)
Eosinophils Relative: 2.8 % (ref 0.0–5.0)
HCT: 38.2 % — ABNORMAL LOW (ref 39.0–52.0)
Hemoglobin: 12.9 g/dL — ABNORMAL LOW (ref 13.0–17.0)
Lymphs Abs: 2.8 10*3/uL (ref 0.7–4.0)
MCV: 82.9 fl (ref 78.0–100.0)
Monocytes Absolute: 0.6 10*3/uL (ref 0.1–1.0)
Monocytes Relative: 6.4 % (ref 3.0–12.0)
Neutro Abs: 5.4 10*3/uL (ref 1.4–7.7)
RBC: 4.6 Mil/uL (ref 4.22–5.81)
WBC: 9.3 10*3/uL (ref 4.5–10.5)

## 2012-12-24 ENCOUNTER — Ambulatory Visit (INDEPENDENT_AMBULATORY_CARE_PROVIDER_SITE_OTHER): Payer: 59 | Admitting: Gastroenterology

## 2012-12-24 ENCOUNTER — Other Ambulatory Visit: Payer: 59

## 2012-12-24 ENCOUNTER — Encounter: Payer: Self-pay | Admitting: Gastroenterology

## 2012-12-24 VITALS — BP 122/90 | HR 80 | Ht 77.0 in | Wt >= 6400 oz

## 2012-12-24 DIAGNOSIS — K529 Noninfective gastroenteritis and colitis, unspecified: Secondary | ICD-10-CM | POA: Insufficient documentation

## 2012-12-24 DIAGNOSIS — K5289 Other specified noninfective gastroenteritis and colitis: Secondary | ICD-10-CM

## 2012-12-24 DIAGNOSIS — R197 Diarrhea, unspecified: Secondary | ICD-10-CM

## 2012-12-24 DIAGNOSIS — A09 Infectious gastroenteritis and colitis, unspecified: Secondary | ICD-10-CM

## 2012-12-24 MED ORDER — DICYCLOMINE HCL 20 MG PO TABS: ORAL_TABLET | ORAL | Status: DC

## 2012-12-24 NOTE — Progress Notes (Signed)
12/24/2012 Stephen Dunlap [id number] [birth year]   History of Present Illness:  Patient is a pleasant 42 year old male who presents to our office today after suffering from an episode of what he and his wife think was food poisoning.  He says that on May 18th he ate a corn dog and woke up at 2 AM the next morning with severe diarrhea and abdominal cramps.  This lasted for several days with 15-20 watery BM's per day.  There was some nausea, but no vomiting.  His wife is a Engineer, civil (consulting) and had Phenergan at home so he was taking that as needed.  He called our office for an appointment and we recommended stool studies, which he turned in to the lab this morning.  Also had blood-work done yesterday, which was relatively unremarkable (CBC, CMP).  The only noticeable change in labs was a Hgb of 16.0 grams one week ago and Hgb of 12.9 grams yesterday.  He said that he was not even drinking a half of a cup of water when he was very sick last week, therefore, I think that the Hgb of 16.0 grams was hemo-concentrated due to dehydration (his Hgb typically runs in the low 14 gram range).  His wife was concerned because she said that for a couple of days last week his BM's smelled like a GIB and were black.  Saw PCP and guaiac was positive.      He says that starting yesterday his stools are getting more form and are brown; they are just soft.  Only had one BM yesterday, but two so far today.  Appetite is much better, but still getting some cramps after he eats.  Is back to work now.    Just of note, he had a colonoscopy in 06/2010 at which time he only had small internal and external hemorrhoids.  Current Medications, Allergies, Past Medical History, Past Surgical History, Family History and Social History were reviewed in [employer] record.   Physical Exam: BP 122/90  Pulse 80  Ht 6\' 5"  (1.956 m)  Wt 413 lb 6.4 oz (187.517 kg)  BMI 49.01 kg/m2 General: Well developed, black male in no acute  distress Head: Normocephalic and atraumatic Eyes:  sclerae anicteric, conjunctiva pink  Ears: Normal auditory acuity Lungs: Clear throughout to auscultation Heart: Regular rate and rhythm Abdomen: Soft, non-distended. No masses, no hepatomegaly. Normal to hyperactive bowel sounds.   Minimal right sided TTP without R/R/G. Musculoskeletal: Symmetrical with no gross deformities  Extremities: No edema  Neurological: Alert oriented x 4, grossly nonfocal Psychological:  Alert and cooperative. Normal mood and affect  Assessment and Recommendations: -Likely acute diarrheal illness/gastroenteritis, possibly secondary to food borne pathogen.  Patient is much improved at this point.  Turned in stool samples this AM so we will follow-up the results of those.  Can take Phenergan prn and continue other symptomatic treatment.  Will give Bentyl to try for cramping if he desires.  Call back if worsens again or no further improvement.  Has follow-up with Dr. Christella Hartigan in mid-June.

## 2012-12-24 NOTE — Progress Notes (Signed)
I agree with the plan noted above

## 2012-12-24 NOTE — Patient Instructions (Addendum)
We have sent a prescription for Bentyl to your pharmacy. Please, keep your office visit scheduled with Dr. Christella Hartigan on 01/14/13 at 8:30 AM.

## 2012-12-27 LAB — OVA AND PARASITE SCREEN: OP: NONE SEEN

## 2012-12-27 LAB — CLOSTRIDIUM DIFFICILE BY PCR: Toxigenic C. Difficile by PCR: NOT DETECTED

## 2012-12-28 LAB — STOOL CULTURE

## 2013-01-14 ENCOUNTER — Ambulatory Visit (INDEPENDENT_AMBULATORY_CARE_PROVIDER_SITE_OTHER): Payer: 59 | Admitting: Gastroenterology

## 2013-01-14 ENCOUNTER — Encounter: Payer: Self-pay | Admitting: Gastroenterology

## 2013-01-14 ENCOUNTER — Other Ambulatory Visit (INDEPENDENT_AMBULATORY_CARE_PROVIDER_SITE_OTHER): Payer: 59

## 2013-01-14 VITALS — BP 138/80 | HR 80 | Ht 74.61 in | Wt >= 6400 oz

## 2013-01-14 DIAGNOSIS — R197 Diarrhea, unspecified: Secondary | ICD-10-CM

## 2013-01-14 LAB — CBC WITH DIFFERENTIAL/PLATELET
Basophils Relative: 0.5 % (ref 0.0–3.0)
Eosinophils Absolute: 0.2 10*3/uL (ref 0.0–0.7)
Eosinophils Relative: 2 % (ref 0.0–5.0)
HCT: 41.4 % (ref 39.0–52.0)
Lymphs Abs: 2.8 10*3/uL (ref 0.7–4.0)
MCHC: 33.3 g/dL (ref 30.0–36.0)
MCV: 85.5 fl (ref 78.0–100.0)
Monocytes Absolute: 0.6 10*3/uL (ref 0.1–1.0)
Neutro Abs: 6.2 10*3/uL (ref 1.4–7.7)
Neutrophils Relative %: 63.3 % (ref 43.0–77.0)
RBC: 4.84 Mil/uL (ref 4.22–5.81)
WBC: 9.8 10*3/uL (ref 4.5–10.5)

## 2013-01-14 NOTE — Patient Instructions (Addendum)
You will have labs checked today in the basement lab.  Please head down after you check out with the front desk  (cbc, celiac sprue panel, home FOBT testing kit two sets). Trial of dairy free diet for one week to see if you are lactose intolerant.  If you note less stools, then you are probably lactose intolerant. Call Dr. Christella Hartigan with result of dairy free trial.                                               We are excited to introduce MyChart, a new best-in-class service that provides you online access to important information in your electronic medical record. We want to make it easier for you to view your health information - all in one secure location - when and where you need it. We expect MyChart will enhance the quality of care and service we provide.  When you register for MyChart, you can:    View your test results.    Request appointments and receive appointment reminders via email.    Request medication renewals.    View your medical history, allergies, medications and immunizations.    Communicate with your physician's office through a password-protected site.    Conveniently print information such as your medication lists.  To find out if MyChart is right for you, please talk to a member of our clinical staff today. We will gladly answer your questions about this free health and wellness tool.  If you are age 41 or older and want a member of your family to have access to your record, you must provide written consent by completing a proxy form available at our office. Please speak to our clinical staff about guidelines regarding accounts for patients younger than age 68.  As you activate your MyChart account and need any technical assistance, please call the MyChart technical support line at [phone]) or email your question to [email or web address]. If you email your question(s), please include your name, a return phone number and the best time to reach  you.  If you have non-urgent health-related questions, you can send a message to our office through MyChart at [email or web address]. If you have a medical emergency, call 911.  Thank you for using MyChart as your new health and wellness resource!   MyChart licensed from [employer],  [id number]. Patents Pending.

## 2013-01-14 NOTE — Progress Notes (Signed)
Review of pertinent gastrointestinal problems:  1. minor rectal bleeding from hemorrhoids. Colonoscopy December 2011 found no polyps. There were small hemorrhoids. Repeat colonoscopy at 10 year interval 2. Acute diarrheal illness 11/2012: normal cbc, cmet, stool tests (routine culture, ova parasites, c diff), presumed infectious, likely viral  HPI: This is a   very pleasant 42 year old man whom I last saw about a year ago.  He is here with his wife today.  He was in our office 2-3 weeks ago with an acute diarrheal illness.  Acute diarrheal illness that lasted about 3 weeks.  Was going 10-12 times per day.  Wife says it has a GI bleed type smell.    Wife says it was black colored stools.  Normally has 5-6 bms per day.  This has been his bowel pattern for at least 10-15 years  Can have urgency at times.   Past Medical History  Diagnosis Date  . Hypertension   . Diabetes mellitus   . Prostatitis   . Morbid obesity   . UTI (urinary tract infection)     Past Surgical History  Procedure Laterality Date  . Hernia repair    . Cysto bedside      Current Outpatient Prescriptions  Medication Sig Dispense Refill  . cetirizine (ZYRTEC) 10 MG tablet Take 1 tablet (10 mg total) by mouth daily as needed for allergies.  30 tablet  3  . Colesevelam HCl 3.75 G PACK Take 1 packet by mouth daily.  90 each  3  . dicyclomine (BENTYL) 20 MG tablet Take one po every 6 hours prn  20 tablet  0  . fexofenadine (ALLEGRA) 180 MG tablet Take 1 tablet (180 mg total) by mouth daily.  30 tablet  11  . Multiple Vitamin (MULTIVITAMIN) tablet Take 1 tablet by mouth daily.        . Nutritional Supplements (SOY PROTEIN SHAKE PO) Take by mouth daily. W/gym workout       . olmesartan-hydrochlorothiazide (BENICAR HCT) 20-12.5 MG per tablet Take 1 tablet by mouth daily.  56 tablet  0  . Omeprazole Magnesium (PRILOSEC OTC PO) Take by mouth.        . promethazine (PHENERGAN) 25 MG tablet Take 1 tablet (25 mg total)  by mouth every 6 (six) hours as needed for nausea.  40 tablet  1  . rosuvastatin (CRESTOR) 10 MG tablet Take 10 mg by mouth daily as needed.       No current facility-administered medications for this visit.    Allergies as of 01/14/2013 - Review Complete 01/14/2013  Allergen Reaction Noted  . Invokana (canagliflozin)  12/02/2012    Family History  Problem Relation Age of Onset  . Arthritis Other   . Cancer Other     breast  . Diabetes Other   . Hypertension Other   . Hyperlipidemia Other     History   Social History  . Marital Status: Married    Spouse Name: N/A    Number of Children: 3  . Years of Education: N/A   Occupational History  . chemical process operator    Social History Main Topics  . Smoking status: Never Smoker   . Smokeless tobacco: Never Used  . Alcohol Use: 1.2 oz/week    2 Cans of beer per week     Comment: Socially  . Drug Use: No  . Sexually Active: Yes   Other Topics Concern  . Not on file   Social History Narrative  . No  narrative on file      Physical Exam: BP 138/80  Pulse 80  Ht 6' 2.61" (1.895 m)  Wt 416 lb 6 oz (188.866 kg)  BMI 52.59 kg/m2 Constitutional:  massively obese otherwise well-appearing  Psychiatric: alert and oriented x3 Abdomen: soft, nontender, nondistended, no obvious ascites, no peritoneal signs, normal bowel sounds     Assessment and plan: 42 y.o. male with chronic loose stools, recent acute diarrheal illness that has resolved  He and his wife say that the stools have a somewhat blood-like smell to them. I'm going to get a CBC on him today and he will complete 2 sets of Hemoccult cards. If any of these are positive I will want to consider endoscopic evaluation. He generally has 5-6 formed stools per day. This has been his pattern for many years. Perhaps he has celiac sprue [name] is unlikely given he is not iron deficient, he is obese. I will still test for celiac sprue panel. Have also recommended dairy   Free trial for one week.

## 2013-01-17 LAB — CELIAC PANEL 10
Endomysial Screen: NEGATIVE
Gliadin IgA: 4.6 U/mL (ref <20)
Tissue Transglut Ab: 2.7 U/mL (ref <20)

## 2013-06-02 ENCOUNTER — Other Ambulatory Visit: Payer: Self-pay

## 2013-06-22 ENCOUNTER — Other Ambulatory Visit (INDEPENDENT_AMBULATORY_CARE_PROVIDER_SITE_OTHER): Payer: 59

## 2013-06-22 ENCOUNTER — Encounter: Payer: Self-pay | Admitting: Internal Medicine

## 2013-06-22 ENCOUNTER — Ambulatory Visit (INDEPENDENT_AMBULATORY_CARE_PROVIDER_SITE_OTHER): Payer: 59 | Admitting: Internal Medicine

## 2013-06-22 VITALS — BP 136/88 | HR 88 | Temp 97.8°F | Resp 16 | Ht 77.0 in | Wt >= 6400 oz

## 2013-06-22 DIAGNOSIS — I10 Essential (primary) hypertension: Secondary | ICD-10-CM

## 2013-06-22 DIAGNOSIS — E785 Hyperlipidemia, unspecified: Secondary | ICD-10-CM

## 2013-06-22 DIAGNOSIS — E7849 Other hyperlipidemia: Secondary | ICD-10-CM

## 2013-06-22 DIAGNOSIS — IMO0001 Reserved for inherently not codable concepts without codable children: Secondary | ICD-10-CM

## 2013-06-22 DIAGNOSIS — F329 Major depressive disorder, single episode, unspecified: Secondary | ICD-10-CM

## 2013-06-22 DIAGNOSIS — Z23 Encounter for immunization: Secondary | ICD-10-CM

## 2013-06-22 DIAGNOSIS — K219 Gastro-esophageal reflux disease without esophagitis: Secondary | ICD-10-CM

## 2013-06-22 DIAGNOSIS — F32A Depression, unspecified: Secondary | ICD-10-CM | POA: Insufficient documentation

## 2013-06-22 LAB — TSH: TSH: 2.16 u[IU]/mL (ref 0.35–5.50)

## 2013-06-22 LAB — LIPID PANEL
Total CHOL/HDL Ratio: 6
VLDL: 22 mg/dL (ref 0.0–40.0)

## 2013-06-22 LAB — URINALYSIS, ROUTINE W REFLEX MICROSCOPIC
Bilirubin Urine: NEGATIVE
Ketones, ur: NEGATIVE
Leukocytes, UA: NEGATIVE
Nitrite: NEGATIVE
Specific Gravity, Urine: >=1.03 (ref 1.000–1.030)
Total Protein, Urine: NEGATIVE
pH: 5.5 (ref 5.0–8.0)

## 2013-06-22 LAB — COMPREHENSIVE METABOLIC PANEL
ALT: 27 U/L (ref 0–53)
AST: 18 U/L (ref 0–37)
Alkaline Phosphatase: 80 U/L (ref 39–117)
CO2: 28 mEq/L (ref 19–32)
Calcium: 9.5 mg/dL (ref 8.4–10.5)
Chloride: 105 mEq/L (ref 96–112)
Creatinine, Ser: 0.8 mg/dL (ref 0.4–1.5)
GFR: 138.6 mL/min (ref >60.00)

## 2013-06-22 LAB — CBC WITH DIFFERENTIAL/PLATELET
Basophils Absolute: 0.1 10*3/uL (ref 0.0–0.1)
Basophils Relative: 0.6 % (ref 0.0–3.0)
Hemoglobin: 14.8 g/dL (ref 13.0–17.0)
Lymphocytes Relative: 31.5 % (ref 12.0–46.0)
Lymphs Abs: 3.5 10*3/uL (ref 0.7–4.0)
MCV: 83.4 fl (ref 78.0–100.0)
Monocytes Absolute: 0.5 10*3/uL (ref 0.1–1.0)
Monocytes Relative: 4.5 % (ref 3.0–12.0)
Neutro Abs: 7 10*3/uL (ref 1.4–7.7)
RBC: 5.41 Mil/uL (ref 4.22–5.81)
RDW: 13.6 % (ref 11.5–14.6)
WBC: 11.2 10*3/uL — ABNORMAL HIGH (ref 4.5–10.5)

## 2013-06-22 LAB — LDL CHOLESTEROL, DIRECT: Direct LDL: 154.4 mg/dL

## 2013-06-22 MED ORDER — OMEPRAZOLE 20 MG PO CPDR: 20.0000 mg | DELAYED_RELEASE_CAPSULE | Freq: Every day | ORAL | Status: DC

## 2013-06-22 MED ORDER — OLMESARTAN MEDOXOMIL-HCTZ 20-12.5 MG PO TABS: 1.0000 | ORAL_TABLET | Freq: Every day | ORAL | Status: DC

## 2013-06-22 MED ORDER — SITAGLIPTIN PHOS-METFORMIN HCL 50-1000 MG PO TABS: 1.0000 | ORAL_TABLET | Freq: Two times a day (BID) | ORAL | Status: DC

## 2013-06-22 MED ORDER — ROSUVASTATIN CALCIUM 10 MG PO TABS: 10.0000 mg | ORAL_TABLET | Freq: Every day | ORAL | Status: DC

## 2013-06-22 MED ORDER — VILAZODONE HCL 10 & 20 & 40 MG PO KIT: 1.0000 | PACK | Freq: Every day | ORAL | Status: DC

## 2013-06-22 NOTE — Progress Notes (Signed)
Pre visit review using our clinic review tool, if applicable. No additional management support is needed unless otherwise documented below in the visit note. 

## 2013-06-22 NOTE — Assessment & Plan Note (Signed)
He is doing well on the PPI 

## 2013-06-22 NOTE — Assessment & Plan Note (Signed)
I have asked him to start Viibryd and psychotherapy

## 2013-06-22 NOTE — Assessment & Plan Note (Signed)
His BP is well controlled Lytes and renal function are normal 

## 2013-06-22 NOTE — Assessment & Plan Note (Signed)
I have asked him to restart crestor and welchol

## 2013-06-22 NOTE — Assessment & Plan Note (Signed)
His blood sugar is not well controlled I have asked him to restart janumet

## 2013-06-22 NOTE — Patient Instructions (Signed)

## 2013-06-22 NOTE — Progress Notes (Signed)
Subjective:    Patient ID: Stephen Dunlap, male    DOB: Feb 02, 1971, 42 y.o.   MRN: [id number]  Diabetes He presents for his follow-up diabetic visit. He has type 2 diabetes mellitus. His disease course has been fluctuating. There are no hypoglycemic associated symptoms. Pertinent negatives for hypoglycemia include no confusion or nervousness/anxiousness. Associated symptoms include polydipsia and polyuria. Pertinent negatives for diabetes include no blurred vision, no chest pain, no fatigue, no foot paresthesias, no foot ulcerations, no polyphagia, no visual change, no weakness and no weight loss. There are no hypoglycemic complications. There are no diabetic complications. Current diabetic treatment includes oral agent (monotherapy). He is compliant with treatment some of the time. His weight is stable. He is following a generally unhealthy diet. When asked about meal planning, he reported none. He has not had a previous visit with a dietician. He never participates in exercise. There is no change in his home blood glucose trend. An ACE inhibitor/angiotensin II receptor blocker is being taken. He does not see a podiatrist.Eye exam is not current.      Review of Systems  Constitutional: Negative.  Negative for fever, chills, weight loss, diaphoresis, activity change, appetite change, fatigue and unexpected weight change.  HENT: Negative.   Eyes: Negative.  Negative for blurred vision.  Respiratory: Positive for apnea. Negative for cough, choking, chest tightness, shortness of breath, wheezing and stridor.   Cardiovascular: Negative.  Negative for chest pain, palpitations and leg swelling.  Gastrointestinal: Negative.  Negative for nausea, vomiting, abdominal pain, diarrhea, constipation and blood in stool.  Endocrine: Positive for polydipsia and polyuria. Negative for polyphagia.  Genitourinary: Negative.   Musculoskeletal: Negative.  Negative for arthralgias, back pain, gait problem, joint  swelling, myalgias, neck pain and neck stiffness.  Skin: Negative.   Allergic/Immunologic: Negative.   Neurological: Negative.  Negative for weakness.  Hematological: Negative.  Negative for adenopathy. Does not bruise/bleed easily.  Psychiatric/Behavioral: Positive for dysphoric mood. Negative for suicidal ideas, hallucinations, behavioral problems, confusion, sleep disturbance, self-injury, decreased concentration and agitation. The patient is not nervous/anxious and is not hyperactive.        He has been having crying spells, anhedonia, and fells hopeless and helpless.       Objective:   Physical Exam  Vitals reviewed. Constitutional: He is oriented to person, place, and time. He appears well-developed and well-nourished. No distress.  HENT:  Head: Normocephalic and atraumatic.  Mouth/Throat: Oropharynx is clear and moist. No oropharyngeal exudate.  Eyes: Conjunctivae are normal. Right eye exhibits no discharge. Left eye exhibits no discharge. No scleral icterus.  Neck: Normal range of motion. Neck supple. No JVD present. No tracheal deviation present. No thyromegaly present.  Cardiovascular: Normal rate, regular rhythm, normal heart sounds and intact distal pulses.  Exam reveals no gallop and no friction rub.   No murmur heard. Pulmonary/Chest: Effort normal and breath sounds normal. No stridor. No respiratory distress. He has no wheezes. He has no rales. He exhibits no tenderness.  Abdominal: Soft. Bowel sounds are normal. He exhibits no distension and no mass. There is no tenderness. There is no rebound and no guarding.  Musculoskeletal: Normal range of motion. He exhibits no edema and no tenderness.  Lymphadenopathy:    He has no cervical adenopathy.  Neurological: He is oriented to person, place, and time.  Skin: Skin is warm and dry. No rash noted. He is not diaphoretic. No erythema. No pallor.  Psychiatric: His speech is normal and behavior is normal. Judgment and thought  content normal. His mood appears not anxious. His affect is not angry, not blunt, not labile and not inappropriate. Cognition and memory are normal. He exhibits a depressed mood (sad and tearful). He expresses no homicidal and no suicidal ideation. He expresses no suicidal plans and no homicidal plans.     Lab Results  Component Value Date   WBC 9.8 01/14/2013   HGB 13.8 01/14/2013   HCT 41.4 01/14/2013   PLT 249.0 01/14/2013   GLUCOSE 148* 12/15/2012   CHOL 213* 12/02/2012   TRIG 92.0 12/02/2012   HDL 38.80* 12/02/2012   LDLDIRECT 155.6 12/02/2012   ALT 31 12/15/2012   AST 15 12/15/2012   NA 139 12/15/2012   K 3.8 12/15/2012   CL 107 12/15/2012   CREATININE 1.0 12/15/2012   BUN 7 12/15/2012   CO2 27 12/15/2012   TSH 1.78 12/02/2012   PSA 0.17 12/02/2012   HGBA1C 8.0* 12/02/2012   MICROALBUR 2.0* 11/17/2011       Assessment & Plan:

## 2013-10-27 ENCOUNTER — Ambulatory Visit (INDEPENDENT_AMBULATORY_CARE_PROVIDER_SITE_OTHER): Payer: 59 | Admitting: Internal Medicine

## 2013-10-27 ENCOUNTER — Encounter: Payer: Self-pay | Admitting: Internal Medicine

## 2013-10-27 ENCOUNTER — Other Ambulatory Visit (INDEPENDENT_AMBULATORY_CARE_PROVIDER_SITE_OTHER): Payer: 59

## 2013-10-27 VITALS — BP 136/90 | HR 87 | Temp 98.7°F | Resp 16 | Ht 77.0 in | Wt >= 6400 oz

## 2013-10-27 DIAGNOSIS — E1165 Type 2 diabetes mellitus with hyperglycemia: Principal | ICD-10-CM

## 2013-10-27 DIAGNOSIS — IMO0001 Reserved for inherently not codable concepts without codable children: Secondary | ICD-10-CM

## 2013-10-27 DIAGNOSIS — I10 Essential (primary) hypertension: Secondary | ICD-10-CM

## 2013-10-27 DIAGNOSIS — J019 Acute sinusitis, unspecified: Secondary | ICD-10-CM | POA: Insufficient documentation

## 2013-10-27 DIAGNOSIS — E785 Hyperlipidemia, unspecified: Secondary | ICD-10-CM

## 2013-10-27 LAB — BASIC METABOLIC PANEL
BUN: 14 mg/dL (ref 6–23)
CHLORIDE: 101 meq/L (ref 96–112)
CO2: 26 mEq/L (ref 19–32)
CREATININE: 0.9 mg/dL (ref 0.4–1.5)
Calcium: 9.5 mg/dL (ref 8.4–10.5)
GFR: 127.16 mL/min (ref >60.00)
Glucose, Bld: 262 mg/dL — ABNORMAL HIGH (ref 70–99)
Potassium: 4 mEq/L (ref 3.5–5.1)
Sodium: 135 mEq/L (ref 135–145)

## 2013-10-27 LAB — HEMOGLOBIN A1C: Hgb A1c MFr Bld: 9.7 % — ABNORMAL HIGH (ref 4.6–6.5)

## 2013-10-27 MED ORDER — FEXOFENADINE HCL 180 MG PO TABS: 180.0000 mg | ORAL_TABLET | Freq: Every day | ORAL | Status: DC

## 2013-10-27 MED ORDER — AMOXICILLIN 875 MG PO TABS: 875.0000 mg | ORAL_TABLET | Freq: Two times a day (BID) | ORAL | Status: DC

## 2013-10-27 NOTE — Progress Notes (Signed)
Subjective:    Patient ID: Stephen Dunlap, male    DOB: 1971/02/14, 43 y.o.   MRN: [id number]  Sinusitis This is a new problem. The current episode started 1 to 4 weeks ago. The problem has been gradually worsening since onset. There has been no fever. The fever has been present for less than 1 day. His pain is at a severity of 0/10. He is experiencing no pain. Associated symptoms include chills, congestion, sinus pressure and a sore throat. Pertinent negatives include no coughing, diaphoresis, ear pain, headaches, hoarse voice, neck pain, shortness of breath, sneezing or swollen glands. Past treatments include nothing. The treatment provided no relief.      Review of Systems  Constitutional: Positive for chills. Negative for fever, diaphoresis, activity change, appetite change and fatigue.  HENT: Positive for congestion, postnasal drip, rhinorrhea, sinus pressure and sore throat. Negative for ear pain, facial swelling, hoarse voice, nosebleeds, sneezing, tinnitus, trouble swallowing and voice change.   Eyes: Negative.   Respiratory: Negative.  Negative for cough, choking, chest tightness, shortness of breath and wheezing.   Cardiovascular: Negative.  Negative for chest pain, palpitations and leg swelling.  Gastrointestinal: Negative.  Negative for nausea, vomiting, abdominal pain, diarrhea, constipation and blood in stool.  Endocrine: Negative.  Negative for polydipsia, polyphagia and polyuria.  Genitourinary: Negative.   Musculoskeletal: Negative.  Negative for arthralgias, myalgias and neck pain.  Skin: Negative.   Allergic/Immunologic: Negative.   Neurological: Negative.  Negative for headaches.  Hematological: Negative.  Negative for adenopathy. Does not bruise/bleed easily.  Psychiatric/Behavioral: Negative.        Objective:   Physical Exam  Vitals reviewed. Constitutional: He is oriented to person, place, and time. He appears well-developed and well-nourished. No distress.    HENT:  Head: Normocephalic and atraumatic.  Right Ear: Hearing, tympanic membrane, external ear and ear canal normal.  Left Ear: Hearing, external ear and ear canal normal.  Nose: Mucosal edema and rhinorrhea present. Right sinus exhibits maxillary sinus tenderness. Right sinus exhibits no frontal sinus tenderness. Left sinus exhibits maxillary sinus tenderness. Left sinus exhibits no frontal sinus tenderness.  Mouth/Throat: Oropharynx is clear and moist and mucous membranes are normal. Mucous membranes are not pale, not dry and not cyanotic. No oral lesions. No trismus in the jaw. No uvula swelling. No oropharyngeal exudate, posterior oropharyngeal edema, posterior oropharyngeal erythema or tonsillar abscesses.  Eyes: Conjunctivae are normal. Right eye exhibits no discharge. Left eye exhibits no discharge. No scleral icterus.  Neck: Normal range of motion. Neck supple. No JVD present. No tracheal deviation present. No thyromegaly present.  Cardiovascular: Normal rate, regular rhythm, normal heart sounds and intact distal pulses.  Exam reveals no gallop and no friction rub.   No murmur heard. Pulmonary/Chest: Effort normal and breath sounds normal. No stridor. No respiratory distress. He has no wheezes. He has no rales. He exhibits no tenderness.  Abdominal: Soft. Bowel sounds are normal. He exhibits no distension and no mass. There is no tenderness. There is no rebound and no guarding.  Musculoskeletal: Normal range of motion. He exhibits no edema and no tenderness.  Lymphadenopathy:    He has no cervical adenopathy.  Neurological: He is oriented to person, place, and time.  Skin: Skin is warm and dry. No rash noted. He is not diaphoretic. No erythema. No pallor.      Lab Results  Component Value Date   WBC 11.2* 06/22/2013   HGB 14.8 06/22/2013   HCT 45.2 06/22/2013  PLT 260.0 06/22/2013   GLUCOSE 256* 06/22/2013   CHOL 203* 06/22/2013   TRIG 110.0 06/22/2013   HDL 35.70*  06/22/2013   LDLDIRECT 154.4 06/22/2013   ALT 27 06/22/2013   AST 18 06/22/2013   NA 137 06/22/2013   K 4.2 06/22/2013   CL 105 06/22/2013   CREATININE 0.8 06/22/2013   BUN 13 06/22/2013   CO2 28 06/22/2013   TSH 2.16 06/22/2013   PSA 0.17 12/02/2012   HGBA1C 11.8* 06/22/2013   MICROALBUR 2.0* 11/17/2011      Assessment & Plan:

## 2013-10-27 NOTE — Patient Instructions (Signed)

## 2013-10-27 NOTE — Progress Notes (Signed)
Pre visit review using our clinic review tool, if applicable. No additional management support is needed unless otherwise documented below in the visit note. 

## 2013-10-31 NOTE — Assessment & Plan Note (Signed)
I will treat the infection with amoxil 

## 2013-10-31 NOTE — Assessment & Plan Note (Signed)
His BP is well controlled His lytes and renal function are stable 

## 2013-10-31 NOTE — Assessment & Plan Note (Signed)
His A1C shows some improvement in his blood sugars He will cont the current meds and will work on his lifestyle modifications

## 2013-11-07 ENCOUNTER — Encounter: Payer: Self-pay | Admitting: Internal Medicine

## 2013-12-01 ENCOUNTER — Encounter: Payer: Self-pay | Admitting: Internal Medicine

## 2013-12-01 ENCOUNTER — Other Ambulatory Visit: Payer: Self-pay | Admitting: Internal Medicine

## 2013-12-01 DIAGNOSIS — J019 Acute sinusitis, unspecified: Secondary | ICD-10-CM

## 2013-12-01 MED ORDER — FEXOFENADINE HCL 180 MG PO TABS: 180.0000 mg | ORAL_TABLET | Freq: Every day | ORAL | Status: DC

## 2013-12-01 MED ORDER — AMOXICILLIN 875 MG PO TABS: 875.0000 mg | ORAL_TABLET | Freq: Two times a day (BID) | ORAL | Status: DC

## 2014-01-26 ENCOUNTER — Telehealth: Payer: Self-pay | Admitting: Internal Medicine

## 2014-01-26 NOTE — Telephone Encounter (Signed)
Attempted to call patient but "voice mail is not set up yet". Message will be sent to mychart.

## 2014-02-23 ENCOUNTER — Telehealth: Payer: Self-pay | Admitting: *Deleted

## 2014-02-23 NOTE — Telephone Encounter (Signed)
Left msg on triage requesting samples on his janumet. Called pt will leave some for pick-up, but did let him no we are no longer going to be getting samples so this will be the last../lmb

## 2014-03-03 ENCOUNTER — Other Ambulatory Visit (INDEPENDENT_AMBULATORY_CARE_PROVIDER_SITE_OTHER): Payer: 59

## 2014-03-03 ENCOUNTER — Ambulatory Visit (INDEPENDENT_AMBULATORY_CARE_PROVIDER_SITE_OTHER): Payer: 59 | Admitting: Internal Medicine

## 2014-03-03 ENCOUNTER — Encounter: Payer: Self-pay | Admitting: Internal Medicine

## 2014-03-03 VITALS — BP 142/92 | HR 95 | Temp 98.5°F | Resp 16 | Ht 77.0 in | Wt >= 6400 oz

## 2014-03-03 DIAGNOSIS — I10 Essential (primary) hypertension: Secondary | ICD-10-CM

## 2014-03-03 DIAGNOSIS — E1165 Type 2 diabetes mellitus with hyperglycemia: Secondary | ICD-10-CM

## 2014-03-03 DIAGNOSIS — IMO0001 Reserved for inherently not codable concepts without codable children: Secondary | ICD-10-CM

## 2014-03-03 DIAGNOSIS — E785 Hyperlipidemia, unspecified: Secondary | ICD-10-CM

## 2014-03-03 DIAGNOSIS — G4726 Circadian rhythm sleep disorder, shift work type: Secondary | ICD-10-CM | POA: Insufficient documentation

## 2014-03-03 DIAGNOSIS — Z23 Encounter for immunization: Secondary | ICD-10-CM

## 2014-03-03 LAB — COMPREHENSIVE METABOLIC PANEL
ALT: 27 U/L (ref 0–53)
AST: 22 U/L (ref 0–37)
Albumin: 4 g/dL (ref 3.5–5.2)
Alkaline Phosphatase: 76 U/L (ref 39–117)
BILIRUBIN TOTAL: 0.5 mg/dL (ref 0.2–1.2)
BUN: 11 mg/dL (ref 6–23)
CALCIUM: 9.6 mg/dL (ref 8.4–10.5)
CHLORIDE: 101 meq/L (ref 96–112)
CO2: 25 meq/L (ref 19–32)
CREATININE: 1 mg/dL (ref 0.4–1.5)
GFR: 109 mL/min (ref >60.00)
Glucose, Bld: 250 mg/dL — ABNORMAL HIGH (ref 70–99)
Potassium: 3.9 mEq/L (ref 3.5–5.1)
Sodium: 135 mEq/L (ref 135–145)
Total Protein: 7.4 g/dL (ref 6.0–8.3)

## 2014-03-03 LAB — URINALYSIS, ROUTINE W REFLEX MICROSCOPIC
Hgb urine dipstick: NEGATIVE
KETONES UR: NEGATIVE
Leukocytes, UA: NEGATIVE
Nitrite: NEGATIVE
PH: 5.5 (ref 5.0–8.0)
Total Protein, Urine: NEGATIVE
URINE GLUCOSE: 500 — AB
UROBILINOGEN UA: 0.2 (ref 0.0–1.0)

## 2014-03-03 LAB — LIPID PANEL
Cholesterol: 201 mg/dL — ABNORMAL HIGH (ref 0–200)
HDL: 32.9 mg/dL — AB (ref >39.00)
LDL Cholesterol: 142 mg/dL — ABNORMAL HIGH (ref 0–99)
NONHDL: 168.1
TRIGLYCERIDES: 133 mg/dL (ref 0.0–149.0)
Total CHOL/HDL Ratio: 6
VLDL: 26.6 mg/dL (ref 0.0–40.0)

## 2014-03-03 LAB — CBC WITH DIFFERENTIAL/PLATELET
BASOS ABS: 0 10*3/uL (ref 0.0–0.1)
Basophils Relative: 0.3 % (ref 0.0–3.0)
Eosinophils Absolute: 0.1 10*3/uL (ref 0.0–0.7)
Eosinophils Relative: 1.2 % (ref 0.0–5.0)
HEMATOCRIT: 42.3 % (ref 39.0–52.0)
HEMOGLOBIN: 14 g/dL (ref 13.0–17.0)
Lymphocytes Relative: 28.4 % (ref 12.0–46.0)
Lymphs Abs: 2.9 10*3/uL (ref 0.7–4.0)
MCHC: 33.1 g/dL (ref 30.0–36.0)
MCV: 84.8 fl (ref 78.0–100.0)
MONOS PCT: 6.8 % (ref 3.0–12.0)
Monocytes Absolute: 0.7 10*3/uL (ref 0.1–1.0)
NEUTROS ABS: 6.6 10*3/uL (ref 1.4–7.7)
Neutrophils Relative %: 63.3 % (ref 43.0–77.0)
Platelets: 275 10*3/uL (ref 150.0–400.0)
RBC: 4.99 Mil/uL (ref 4.22–5.81)
RDW: 13.8 % (ref 11.5–15.5)
WBC: 10.4 10*3/uL (ref 4.0–10.5)

## 2014-03-03 LAB — HEMOGLOBIN A1C: HEMOGLOBIN A1C: 10.1 % — AB (ref 4.6–6.5)

## 2014-03-03 LAB — TSH: TSH: 1.83 u[IU]/mL (ref 0.35–4.50)

## 2014-03-03 MED ORDER — CLONAZEPAM 1 MG PO TABS: 1.0000 mg | ORAL_TABLET | Freq: Every day | ORAL | Status: DC

## 2014-03-03 MED ORDER — SITAGLIPTIN PHOS-METFORMIN HCL 50-1000 MG PO TABS: 1.0000 | ORAL_TABLET | Freq: Two times a day (BID) | ORAL | Status: DC

## 2014-03-03 MED ORDER — INSULIN DETEMIR 100 UNIT/ML FLEXPEN: 50.0000 [IU] | PEN_INJECTOR | Freq: Every day | SUBCUTANEOUS | Status: DC

## 2014-03-03 MED ORDER — OLMESARTAN MEDOXOMIL-HCTZ 20-12.5 MG PO TABS: 1.0000 | ORAL_TABLET | Freq: Every day | ORAL | Status: DC

## 2014-03-03 NOTE — Progress Notes (Signed)
Subjective:    Patient ID: Stephen Dunlap, male    DOB: 04-14-1971, 43 y.o.   MRN: [id number]  Diabetes He presents for his follow-up diabetic visit. He has type 2 diabetes mellitus. His disease course has been worsening. Hypoglycemia symptoms include nervousness/anxiousness. Pertinent negatives for hypoglycemia include no confusion. Associated symptoms include polydipsia, polyphagia and polyuria. Pertinent negatives for diabetes include no blurred vision, no chest pain, no fatigue, no foot paresthesias, no foot ulcerations, no visual change, no weakness and no weight loss. There are no hypoglycemic complications. There are no diabetic complications. Risk factors for coronary artery disease include stress. When asked about current treatments, none were reported. He is compliant with treatment none of the time. His weight is stable. He is following a generally unhealthy diet. Meal planning includes avoidance of concentrated sweets. He has not had a previous visit with a dietician. He never participates in exercise. There is no change in his home blood glucose trend. An ACE inhibitor/angiotensin II receptor blocker is not being taken. He does not see a podiatrist.Eye exam is not current.      Review of Systems  Constitutional: Negative.  Negative for fever, chills, weight loss, diaphoresis, appetite change and fatigue.  HENT: Negative.   Eyes: Negative.  Negative for blurred vision.  Respiratory: Positive for apnea. Negative for cough, choking, chest tightness, shortness of breath, wheezing and stridor.   Cardiovascular: Negative.  Negative for chest pain, palpitations and leg swelling.  Gastrointestinal: Negative.  Negative for nausea, vomiting, abdominal pain, diarrhea, constipation and blood in stool.  Endocrine: Positive for polydipsia, polyphagia and polyuria.  Genitourinary: Negative.   Musculoskeletal: Negative.  Negative for arthralgias, back pain, joint swelling and myalgias.  Skin:  Negative.  Negative for rash.  Allergic/Immunologic: Negative.   Neurological: Negative.  Negative for weakness.  Hematological: Negative.  Negative for adenopathy. Does not bruise/bleed easily.  Psychiatric/Behavioral: Positive for sleep disturbance. Negative for suicidal ideas, hallucinations, behavioral problems, confusion, self-injury, dysphoric mood, decreased concentration and agitation. The patient is nervous/anxious.        Objective:   Physical Exam  Vitals reviewed. Constitutional: He is oriented to person, place, and time. He appears well-developed and well-nourished. No distress.  HENT:  Head: Normocephalic and atraumatic.  Mouth/Throat: Oropharynx is clear and moist. No oropharyngeal exudate.  Eyes: Conjunctivae are normal. Right eye exhibits no discharge. Left eye exhibits no discharge. No scleral icterus.  Neck: Normal range of motion. Neck supple. No JVD present. No tracheal deviation present. No thyromegaly present.  Cardiovascular: Normal rate, regular rhythm, normal heart sounds and intact distal pulses.  Exam reveals no gallop and no friction rub.   No murmur heard. Pulmonary/Chest: Effort normal and breath sounds normal. No stridor. No respiratory distress. He has no wheezes. He has no rales. He exhibits no tenderness.  Abdominal: Soft. Bowel sounds are normal. He exhibits no distension and no mass. There is no tenderness. There is no rebound and no guarding.  Musculoskeletal: Normal range of motion. He exhibits no edema and no tenderness.  Lymphadenopathy:    He has no cervical adenopathy.  Neurological: He is oriented to person, place, and time.  Skin: Skin is warm and dry. No rash noted. He is not diaphoretic. No erythema. No pallor.  Psychiatric: He has a normal mood and affect. His behavior is normal. Judgment and thought content normal.    Lab Results  Component Value Date   WBC 11.2* 06/22/2013   HGB 14.8 06/22/2013   HCT 45.2  06/22/2013   PLT 260.0  06/22/2013   GLUCOSE 262* 10/27/2013   CHOL 203* 06/22/2013   TRIG 110.0 06/22/2013   HDL 35.70* 06/22/2013   LDLDIRECT 154.4 06/22/2013   ALT 27 06/22/2013   AST 18 06/22/2013   NA 135 10/27/2013   K 4.0 10/27/2013   CL 101 10/27/2013   CREATININE 0.9 10/27/2013   BUN 14 10/27/2013   CO2 26 10/27/2013   TSH 2.16 06/22/2013   PSA 0.17 12/02/2012   HGBA1C 9.7* 10/27/2013   MICROALBUR 2.0* 11/17/2011        Assessment & Plan:

## 2014-03-03 NOTE — Patient Instructions (Signed)

## 2014-03-03 NOTE — Assessment & Plan Note (Signed)
His wife has been giving his ativan for this and he tells me that he wants his own supply of a BZD, will try klonopin

## 2014-03-05 NOTE — Assessment & Plan Note (Signed)
His blood sugars are not well controlled I have asked him to restart janumet and to add levemir as well

## 2014-03-05 NOTE — Assessment & Plan Note (Signed)
He will restart crestor

## 2014-03-05 NOTE — Assessment & Plan Note (Signed)
He has not been taking benicar-hct consistently and therefore his BP is not very well controlled I have asked him to take the benicar-hct more consistenly I will monitor his lytes and renal function

## 2014-03-23 ENCOUNTER — Ambulatory Visit: Payer: 59 | Admitting: Endocrinology

## 2014-03-23 DIAGNOSIS — Z0289 Encounter for other administrative examinations: Secondary | ICD-10-CM

## 2014-04-28 ENCOUNTER — Ambulatory Visit (INDEPENDENT_AMBULATORY_CARE_PROVIDER_SITE_OTHER): Payer: 59 | Admitting: Internal Medicine

## 2014-04-28 ENCOUNTER — Ambulatory Visit: Payer: 59 | Admitting: Internal Medicine

## 2014-04-28 ENCOUNTER — Encounter: Payer: Self-pay | Admitting: Internal Medicine

## 2014-04-28 VITALS — BP 132/90 | HR 96 | Temp 98.4°F | Resp 13 | Wt >= 6400 oz

## 2014-04-28 DIAGNOSIS — J209 Acute bronchitis, unspecified: Secondary | ICD-10-CM

## 2014-04-28 DIAGNOSIS — J069 Acute upper respiratory infection, unspecified: Secondary | ICD-10-CM

## 2014-04-28 MED ORDER — HYDROCODONE-HOMATROPINE 5-1.5 MG/5ML PO SYRP: 5.0000 mL | ORAL_SOLUTION | Freq: Four times a day (QID) | ORAL | Status: DC | PRN

## 2014-04-28 MED ORDER — AMOXICILLIN 500 MG PO CAPS: 500.0000 mg | ORAL_CAPSULE | Freq: Three times a day (TID) | ORAL | Status: DC

## 2014-04-28 NOTE — Progress Notes (Signed)
   Subjective:    Patient ID: Stephen Dunlap, male    DOB: Aug 19, 1970, 43 y.o.   MRN: [id number]  HPI   His symptoms began 04/21/14 as head and chest congestion with progression. He was riding in a car for 12 hours with a friend who was ill with a respiratory tract infection  He now has paroxysmal cough lasting 2-3 minutes. Talking can make the cough worse   He has purulent nasal secretions as well as purulent sputum. The sputum was described as green.  He also has associated sneezing wheezing and shortness of breath.He's had some itchy watery eyes.  He did take two left over amoxicillin which were probably 23 months old.   He does have a history of asthma.  His wife stated she heard wheezing last night.  He has never smoked. He is not on ACE inhibitor.  Fasting blood sugars are averaging 98. He may have had one episode of hypoglycemia while on a cruise recently.   Review of Systems   He denies fever, chills, or sweats.  He is not having frontal headache or facial pain.       Objective:   Physical Exam  Positive pertinent findings include: As per CDC guidelines morbid obesity is present.  Tongue post present. Nares are inflamed and dry. He has minimal rhonchi with no increased work of breathing  General appearance:adequately nourished; no acute distress is present.  No  lymphadenopathy about the head, neck, or axilla noted.  Eyes: No conjunctival inflammation or lid edema is present. There is no scleral icterus. Ears:  External ear exam shows no significant lesions or deformities.  Otoscopic examination reveals clear canals, tympanic membranes are intact bilaterally without bulging, retraction, inflammation or discharge. Nose:  External nasal examination shows no deformity or inflammation.  No septal dislocation or deviation.No obstruction to airflow.  Oral exam: Dental hygiene is good; lips and gums are healthy appearing.There is no oropharyngeal erythema or exudate noted  but there is crowding. Neck:  No deformities, thyromegaly, masses, or tenderness noted.  Heart:  Normal rate and regular rhythm. S1 and S2 normal without gallop, murmur, click, rub or other extra sounds.  Lungs:Chest clear to auscultation; no wheezes, rhonchi,rales ,or rubs present. Extremities:  No cyanosis, edema, or clubbing  noted  Skin: Warm & dry w/o jaundice or tenting.          Assessment & Plan:  #1 acute bronchitis  #2 upper respiratory infection  Plan: See orders and recommendations

## 2014-04-28 NOTE — Progress Notes (Signed)
Pre visit review using our clinic review tool, if applicable. No additional management support is needed unless otherwise documented below in the visit note. 

## 2014-04-28 NOTE — Patient Instructions (Signed)

## 2014-05-18 ENCOUNTER — Ambulatory Visit: Payer: 59 | Admitting: *Deleted

## 2014-10-04 ENCOUNTER — Other Ambulatory Visit: Payer: Self-pay | Admitting: Internal Medicine

## 2014-11-26 LAB — HEMOGLOBIN A1C: HEMOGLOBIN A1C: 6.6 % — AB (ref 4.0–6.0)

## 2015-01-19 ENCOUNTER — Telehealth: Payer: Self-pay

## 2015-01-19 NOTE — Telephone Encounter (Signed)
Spoke with pt about coming in for HgA1c recheck

## 2015-01-22 ENCOUNTER — Other Ambulatory Visit: Payer: Self-pay

## 2015-01-26 ENCOUNTER — Other Ambulatory Visit: Payer: Self-pay | Admitting: Internal Medicine

## 2015-02-26 ENCOUNTER — Other Ambulatory Visit (INDEPENDENT_AMBULATORY_CARE_PROVIDER_SITE_OTHER): Payer: 59

## 2015-02-26 ENCOUNTER — Encounter: Payer: Self-pay | Admitting: Internal Medicine

## 2015-02-26 ENCOUNTER — Ambulatory Visit (INDEPENDENT_AMBULATORY_CARE_PROVIDER_SITE_OTHER): Payer: 59 | Admitting: Internal Medicine

## 2015-02-26 ENCOUNTER — Ambulatory Visit (INDEPENDENT_AMBULATORY_CARE_PROVIDER_SITE_OTHER)
Admission: RE | Admit: 2015-02-26 | Discharge: 2015-02-26 | Disposition: A | Discharge status: Home / Self Care | Payer: 59 | Source: Ambulatory Visit | Attending: Internal Medicine | Admitting: Internal Medicine

## 2015-02-26 VITALS — BP 124/84 | HR 88 | Temp 98.7°F | Resp 16 | Ht 77.0 in | Wt >= 6400 oz

## 2015-02-26 DIAGNOSIS — E785 Hyperlipidemia, unspecified: Secondary | ICD-10-CM

## 2015-02-26 DIAGNOSIS — J189 Pneumonia, unspecified organism: Secondary | ICD-10-CM | POA: Diagnosis not present

## 2015-02-26 DIAGNOSIS — K2100 Gastro-esophageal reflux disease with esophagitis, without bleeding: Secondary | ICD-10-CM

## 2015-02-26 DIAGNOSIS — E118 Type 2 diabetes mellitus with unspecified complications: Secondary | ICD-10-CM

## 2015-02-26 DIAGNOSIS — I10 Essential (primary) hypertension: Secondary | ICD-10-CM

## 2015-02-26 DIAGNOSIS — R059 Cough, unspecified: Secondary | ICD-10-CM

## 2015-02-26 DIAGNOSIS — E7849 Other hyperlipidemia: Secondary | ICD-10-CM

## 2015-02-26 DIAGNOSIS — R05 Cough: Secondary | ICD-10-CM

## 2015-02-26 LAB — COMPREHENSIVE METABOLIC PANEL
ALT: 24 U/L (ref 0–53)
AST: 17 U/L (ref 0–37)
Albumin: 4.4 g/dL (ref 3.5–5.2)
Alkaline Phosphatase: 82 U/L (ref 39–117)
BUN: 10 mg/dL (ref 6–23)
CO2: 27 mEq/L (ref 19–32)
Calcium: 9.9 mg/dL (ref 8.4–10.5)
Chloride: 106 mEq/L (ref 96–112)
Creatinine, Ser: 0.91 mg/dL (ref 0.40–1.50)
GFR: 116.79 mL/min (ref >60.00)
Glucose, Bld: 103 mg/dL — ABNORMAL HIGH (ref 70–99)
POTASSIUM: 4.4 meq/L (ref 3.5–5.1)
SODIUM: 140 meq/L (ref 135–145)
Total Bilirubin: 0.3 mg/dL (ref 0.2–1.2)
Total Protein: 7.5 g/dL (ref 6.0–8.3)

## 2015-02-26 LAB — URINALYSIS, ROUTINE W REFLEX MICROSCOPIC
Bilirubin Urine: NEGATIVE
HGB URINE DIPSTICK: NEGATIVE
KETONES UR: NEGATIVE
Leukocytes, UA: NEGATIVE
Nitrite: NEGATIVE
PH: 6 (ref 5.0–8.0)
SPECIFIC GRAVITY, URINE: 1.02 (ref 1.000–1.030)
Total Protein, Urine: NEGATIVE
UROBILINOGEN UA: 0.2 (ref 0.0–1.0)

## 2015-02-26 LAB — MICROALBUMIN / CREATININE URINE RATIO
Creatinine,U: 149.4 mg/dL
Microalb Creat Ratio: 0.7 mg/g (ref 0.0–30.0)
Microalb, Ur: 1.1 mg/dL (ref 0.0–1.9)

## 2015-02-26 LAB — LIPID PANEL
CHOL/HDL RATIO: 5
CHOLESTEROL: 199 mg/dL (ref 0–200)
HDL: 43.3 mg/dL (ref >39.00)
LDL Cholesterol: 138 mg/dL — ABNORMAL HIGH (ref 0–99)
NonHDL: 155.42
TRIGLYCERIDES: 87 mg/dL (ref 0.0–149.0)
VLDL: 17.4 mg/dL (ref 0.0–40.0)

## 2015-02-26 LAB — CBC WITH DIFFERENTIAL/PLATELET
BASOS ABS: 0.1 10*3/uL (ref 0.0–0.1)
Basophils Relative: 0.7 % (ref 0.0–3.0)
Eosinophils Absolute: 0.4 10*3/uL (ref 0.0–0.7)
Eosinophils Relative: 3.7 % (ref 0.0–5.0)
HEMATOCRIT: 45.3 % (ref 39.0–52.0)
Hemoglobin: 15 g/dL (ref 13.0–17.0)
Lymphocytes Relative: 26.4 % (ref 12.0–46.0)
Lymphs Abs: 2.6 10*3/uL (ref 0.7–4.0)
MCHC: 33 g/dL (ref 30.0–36.0)
MCV: 84.6 fl (ref 78.0–100.0)
MONO ABS: 0.7 10*3/uL (ref 0.1–1.0)
Monocytes Relative: 6.8 % (ref 3.0–12.0)
Neutro Abs: 6 10*3/uL (ref 1.4–7.7)
Neutrophils Relative %: 62.4 % (ref 43.0–77.0)
Platelets: 270 10*3/uL (ref 150.0–400.0)
RBC: 5.36 Mil/uL (ref 4.22–5.81)
RDW: 14.6 % (ref 11.5–15.5)
WBC: 9.7 10*3/uL (ref 4.0–10.5)

## 2015-02-26 LAB — HEMOGLOBIN A1C: HEMOGLOBIN A1C: 7.3 % — AB (ref 4.6–6.5)

## 2015-02-26 MED ORDER — FEXOFENADINE HCL 180 MG PO TABS: 180.0000 mg | ORAL_TABLET | Freq: Every day | ORAL | Status: DC

## 2015-02-26 MED ORDER — OMEPRAZOLE 20 MG PO CPDR: 20.0000 mg | DELAYED_RELEASE_CAPSULE | Freq: Every day | ORAL | Status: DC

## 2015-02-26 MED ORDER — AMOXICILLIN-POT CLAVULANATE 875-125 MG PO TABS
1.0000 | ORAL_TABLET | Freq: Two times a day (BID) | ORAL | Status: DC
Start: 2015-02-26 — End: 2015-03-03

## 2015-02-26 MED ORDER — METFORMIN HCL 1000 MG PO TABS: 1000.0000 mg | ORAL_TABLET | Freq: Two times a day (BID) | ORAL | Status: DC

## 2015-02-26 MED ORDER — HYDROCOD POLST-CPM POLST ER 10-8 MG/5ML PO SUER: 5.0000 mL | Freq: Two times a day (BID) | ORAL | Status: DC | PRN

## 2015-02-26 NOTE — Progress Notes (Signed)
Pre visit review using our clinic review tool, if applicable. No additional management support is needed unless otherwise documented below in the visit note. 

## 2015-02-26 NOTE — Patient Instructions (Signed)
Cough, Adult  A cough is a reflex that helps clear your throat and airways. It can help heal the body or may be a reaction to an irritated airway. A cough may only last 2 or 3 weeks (acute) or may last more than 8 weeks (chronic).  CAUSES Acute cough:  Viral or bacterial infections. Chronic cough:  Infections.  Allergies.  Asthma.  Post-nasal drip.  Smoking.  Heartburn or acid reflux.  Some medicines.  Chronic lung problems (COPD).  Cancer. SYMPTOMS   Cough.  Fever.  Chest pain.  Increased breathing rate.  High-pitched whistling sound when breathing (wheezing).  Colored mucus that you cough up (sputum). TREATMENT   A bacterial cough may be treated with antibiotic medicine.  A viral cough must run its course and will not respond to antibiotics.  Your caregiver may recommend other treatments if you have a chronic cough. HOME CARE INSTRUCTIONS   Only take over-the-counter or prescription medicines for pain, discomfort, or fever as directed by your caregiver. Use cough suppressants only as directed by your caregiver.  Use a cold steam vaporizer or humidifier in your bedroom or home to help loosen secretions.  Sleep in a semi-upright position if your cough is worse at night.  Rest as needed.  Stop smoking if you smoke. SEEK IMMEDIATE MEDICAL CARE IF:   You have pus in your sputum.  Your cough starts to worsen.  You cannot control your cough with suppressants and are losing sleep.  You begin coughing up blood.  You have difficulty breathing.  You develop pain which is getting worse or is uncontrolled with medicine.  You have a fever. MAKE SURE YOU:   Understand these instructions.  Will watch your condition.  Will get help right away if you are not doing well or get worse. Document Released: 01/10/2011 Document Revised: 10/06/2011 Document Reviewed: 01/10/2011 Exit[employer] Patient Information 2015 [employer]. This information is not intended  to replace advice given to you by your health care provider. Make sure you discuss any questions you have with your health care provider.  

## 2015-02-26 NOTE — Progress Notes (Signed)
Subjective:  Patient ID: Stephen Dunlap, male    DOB: Sep 06, 1970  Age: 44 y.o. MRN: [id number]  CC: Cough; Hypertension; Hyperlipidemia; and Diabetes   HPI Stephen Dunlap presents for cough productive of green/yellow thick phlegm for 10 days. He complains of sore throat and shortness of breath as well. He denies wheezing, fever, chills, night sweats, chest pain, or hemoptysis.  Outpatient Prescriptions Prior to Visit  Medication Sig Dispense Refill  . dicyclomine (BENTYL) 20 MG tablet Take one po every 6 hours prn 20 tablet 0  . Multiple Vitamin (MULTIVITAMIN) tablet Take 1 tablet by mouth daily.      . Nutritional Supplements (SOY PROTEIN SHAKE PO) Take by mouth daily. W/gym workout     . olmesartan-hydrochlorothiazide (BENICAR HCT) 20-12.5 MG per tablet Take 1 tablet by mouth daily. 90 tablet 3  . amoxicillin (AMOXIL) 500 MG capsule Take 1 capsule (500 mg total) by mouth 3 (three) times daily. 30 capsule 0  . clonazePAM (KLONOPIN) 1 MG tablet Take 1 tablet (1 mg total) by mouth at bedtime. 30 tablet 4  . fexofenadine (ALLEGRA) 180 MG tablet Take 1 tablet (180 mg total) by mouth daily. 30 tablet 11  . HYDROcodone-homatropine (HYDROMET) 5-1.5 MG/5ML syrup Take 5 mLs by mouth every 6 (six) hours as needed for cough. 120 mL 0  . Insulin Detemir (LEVEMIR FLEXTOUCH) 100 UNIT/ML Pen Inject 50 Units into the skin daily at 10 pm. 15 mL 11  . omeprazole (PRILOSEC) 20 MG capsule Take 1 capsule (20 mg total) by mouth daily. 90 capsule 3  . sitaGLIPtin-metformin (JANUMET) 50-1000 MG per tablet Take 1 tablet by mouth 2 (two) times daily with a meal. 180 tablet 2  . rosuvastatin (CRESTOR) 10 MG tablet Take 1 tablet (10 mg total) by mouth daily. 90 tablet 3   No facility-administered medications prior to visit.    ROS Review of Systems  Constitutional: Negative.  Negative for fever, chills, diaphoresis, appetite change and fatigue.  HENT: Negative.   Eyes: Negative.   Respiratory: Positive for  cough and shortness of breath. Negative for choking, chest tightness, wheezing and stridor.   Cardiovascular: Negative.  Negative for chest pain, palpitations and leg swelling.  Gastrointestinal: Negative.  Negative for nausea, vomiting, abdominal pain, diarrhea, constipation and blood in stool.  Endocrine: Negative.  Negative for polydipsia, polyphagia and polyuria.  Genitourinary: Negative.  Negative for hematuria and difficulty urinating.  Musculoskeletal: Negative.  Negative for myalgias, back pain, joint swelling and arthralgias.  Skin: Negative.  Negative for rash.  Allergic/Immunologic: Negative.   Neurological: Negative.   Hematological: Negative.  Negative for adenopathy. Does not bruise/bleed easily.  Psychiatric/Behavioral: Negative.     Objective:  BP 124/84 mmHg  Pulse 88  Temp(Src) 98.7 F (37.1 C) (Oral)  Resp 16  Ht 6\' 5"  (1.956 m)  Wt 404 lb 8 oz (183.48 kg)  BMI 47.96 kg/m2  SpO2 95%  BP Readings from Last 3 Encounters:  02/26/15 124/84  04/28/14 132/90  03/03/14 142/92    Wt Readings from Last 3 Encounters:  02/26/15 404 lb 8 oz (183.48 kg)  04/28/14 418 lb (189.604 kg)  03/03/14 420 lb (190.511 kg)    Physical Exam  Constitutional: He is oriented to person, place, and time. He appears well-developed and well-nourished.  Non-toxic appearance. He does not have a sickly appearance. He does not appear ill. No distress.  HENT:  Mouth/Throat: Oropharynx is clear and moist and mucous membranes are normal. Mucous membranes are not  pale, not dry and not cyanotic. No oral lesions. No trismus in the jaw. No uvula swelling. No oropharyngeal exudate, posterior oropharyngeal edema, posterior oropharyngeal erythema or tonsillar abscesses.  Eyes: Conjunctivae are normal. Dunlap eye exhibits no discharge. Left eye exhibits no discharge. No scleral icterus.  Neck: Normal range of motion. Neck supple. No JVD present. No tracheal deviation present. No thyromegaly present.    Cardiovascular: Normal rate, regular rhythm, normal heart sounds and intact distal pulses.  Exam reveals no gallop and no friction rub.   No murmur heard. Pulmonary/Chest: Effort normal and breath sounds normal. No accessory muscle usage or stridor. No tachypnea. No respiratory distress. He has no decreased breath sounds. He has no wheezes. He has no rhonchi. He has no rales. He exhibits no tenderness.  Abdominal: Soft. Bowel sounds are normal. He exhibits no distension and no mass. There is no tenderness. There is no rebound and no guarding.  Musculoskeletal: Normal range of motion. He exhibits no edema or tenderness.  Lymphadenopathy:    He has no cervical adenopathy.  Neurological: He is oriented to person, place, and time.  Skin: Skin is warm and dry. No rash noted. He is not diaphoretic. No erythema. No pallor.  Vitals reviewed.   Lab Results  Component Value Date   WBC 9.7 02/26/2015   HGB 15.0 02/26/2015   HCT 45.3 02/26/2015   PLT 270.0 02/26/2015   GLUCOSE 103* 02/26/2015   CHOL 199 02/26/2015   TRIG 87.0 02/26/2015   HDL 43.30 02/26/2015   LDLDIRECT 154.4 06/22/2013   LDLCALC 138* 02/26/2015   ALT 24 02/26/2015   AST 17 02/26/2015   NA 140 02/26/2015   K 4.4 02/26/2015   CL 106 02/26/2015   CREATININE 0.91 02/26/2015   BUN 10 02/26/2015   CO2 27 02/26/2015   TSH 1.83 03/03/2014   PSA 0.17 12/02/2012   HGBA1C 7.3* 02/26/2015   MICROALBUR 1.1 02/26/2015    Dg Chest 2 View  07/04/2011   *RADIOLOGY REPORT*  Clinical Data: Cough and chest pain.  CHEST - 2 VIEW  Comparison: Plain films of the chest 03/05/2011 and 06/09/2008.  Findings: The lungs are clear.  Heart size is normal.  No pneumothorax or pleural effusion.  IMPRESSION: No acute disease.  Original Report Authenticated By: Stephen Dunlap. Stephen Dunlap, M.D.   Assessment & Plan:   Stephen Dunlap was seen today for cough, hypertension, hyperlipidemia and diabetes.  Diagnoses and all orders for this visit:  Diabetes mellitus  type 2 with complications- since I last saw him he has seen an endocrinologist and was placed on Trulicity. Since Trulicity and Januvia work in the same incretin in pathway think he should stop the Januvia and will change Janumet just to plain metformin. Orders: -     Comprehensive metabolic panel; Future -     Hemoglobin A1c; Future -     Microalbumin / creatinine urine ratio; Future -     Ambulatory referral to Ophthalmology -     metFORMIN (GLUCOPHAGE) 1000 MG tablet; Take 1 tablet (1,000 mg total) by mouth 2 (two) times daily with a meal.  Essential hypertension- his blood sugar is well controlled, will monitor his lites and renal function today. Orders: -     Comprehensive metabolic panel; Future -     CBC with Differential/Platelet; Future -     Urinalysis, Routine w reflex microscopic (not at [facility]); Future  Hyperlipidemia with target LDL less than 100- he has not achieved his LDL goal, will restart Crestor.  Orders: -     Lipid panel; Future -     Comprehensive metabolic panel; Future  Gastroesophageal reflux disease with esophagitis  Cough- his chest x-ray is normal, will control the cough with Tussionex suspension Orders: -     DG Chest 2 View; Future -     chlorpheniramine-HYDROcodone (TUSSIONEX PENNKINETIC ER) 10-8 MG/5ML SUER; Take 5 mLs by mouth every 12 (twelve) hours as needed for cough.  Pneumonia, organism unspecified- though his chest x-ray is normal he has signs and symptoms consistent with either bacterial bronchitis or pneumonia. Will treat the infection with Augmentin. Orders: -     DG Chest 2 View; Future -     amoxicillin-clavulanate (AUGMENTIN) 875-125 MG per tablet; Take 1 tablet by mouth 2 (two) times daily. -     chlorpheniramine-HYDROcodone (TUSSIONEX PENNKINETIC ER) 10-8 MG/5ML SUER; Take 5 mLs by mouth every 12 (twelve) hours as needed for cough.  Other orders -     omeprazole (PRILOSEC) 20 MG capsule; Take 1 capsule (20 mg total) by mouth daily. -      fexofenadine (ALLEGRA) 180 MG tablet; Take 1 tablet (180 mg total) by mouth daily.   I have discontinued Mr. Schwager sitaGLIPtin-metformin, clonazePAM, Insulin Detemir, amoxicillin, and HYDROcodone-homatropine. I am also having him start on amoxicillin-clavulanate, chlorpheniramine-HYDROcodone, and metFORMIN. Additionally, I am having him maintain his multivitamin, Nutritional Supplements (SOY PROTEIN SHAKE PO), dicyclomine, rosuvastatin, olmesartan-hydrochlorothiazide, empagliflozin, omeprazole, fexofenadine, and Dulaglutide.  Meds ordered this encounter  Medications  . DISCONTD: Dulaglutide (TRULICITY) 1.5 JI/9.6VE SOPN    Sig: Inject 3 mLs into the skin.  Marland Kitchen empagliflozin (JARDIANCE) 10 MG TABS tablet    Sig: Take 10 mg by mouth daily.  Marland Kitchen omeprazole (PRILOSEC) 20 MG capsule    Sig: Take 1 capsule (20 mg total) by mouth daily.    Dispense:  90 capsule    Refill:  3  . fexofenadine (ALLEGRA) 180 MG tablet    Sig: Take 1 tablet (180 mg total) by mouth daily.    Dispense:  90 tablet    Refill:  3  . Dulaglutide (TRULICITY) 9.38 BO/1.7PZ SOPN    Sig: Inject 3 mLs into the skin.  Marland Kitchen amoxicillin-clavulanate (AUGMENTIN) 875-125 MG per tablet    Sig: Take 1 tablet by mouth 2 (two) times daily.    Dispense:  20 tablet    Refill:  1  . chlorpheniramine-HYDROcodone (TUSSIONEX PENNKINETIC ER) 10-8 MG/5ML SUER    Sig: Take 5 mLs by mouth every 12 (twelve) hours as needed for cough.    Dispense:  140 mL    Refill:  0  . metFORMIN (GLUCOPHAGE) 1000 MG tablet    Sig: Take 1 tablet (1,000 mg total) by mouth 2 (two) times daily with a meal.    Dispense:  180 tablet    Refill:  1     Follow-up: Return in about 3 weeks (around 03/19/2015).  Scarlette Calico, MD

## 2015-02-27 MED ORDER — ROSUVASTATIN CALCIUM 10 MG PO TABS: 10.0000 mg | ORAL_TABLET | Freq: Every day | ORAL | Status: DC

## 2015-03-01 NOTE — Telephone Encounter (Signed)
Wife called and said that pt is on day 4 of meds and is not feeling any better.  He was put on a 14 day course.  She is concerned and wanted to speak with nurse.   Best number  [phone]

## 2015-03-02 ENCOUNTER — Ambulatory Visit (INDEPENDENT_AMBULATORY_CARE_PROVIDER_SITE_OTHER): Payer: 59 | Admitting: Internal Medicine

## 2015-03-02 ENCOUNTER — Encounter: Payer: Self-pay | Admitting: Internal Medicine

## 2015-03-02 ENCOUNTER — Telehealth: Payer: Self-pay

## 2015-03-02 VITALS — BP 138/92 | HR 92 | Temp 99.6°F | Ht 77.0 in | Wt >= 6400 oz

## 2015-03-02 DIAGNOSIS — R062 Wheezing: Secondary | ICD-10-CM

## 2015-03-02 DIAGNOSIS — I10 Essential (primary) hypertension: Secondary | ICD-10-CM | POA: Diagnosis not present

## 2015-03-02 DIAGNOSIS — J209 Acute bronchitis, unspecified: Secondary | ICD-10-CM | POA: Diagnosis not present

## 2015-03-02 MED ORDER — AZITHROMYCIN 250 MG PO TABS: ORAL_TABLET | ORAL | Status: DC

## 2015-03-02 MED ORDER — PREDNISONE 10 MG PO TABS: ORAL_TABLET | ORAL | Status: DC

## 2015-03-02 MED ORDER — ALBUTEROL SULFATE HFA 108 (90 BASE) MCG/ACT IN AERS: 2.0000 | INHALATION_SPRAY | Freq: Four times a day (QID) | RESPIRATORY_TRACT | Status: DC | PRN

## 2015-03-02 MED ORDER — HYDROCODONE-HOMATROPINE 5-1.5 MG/5ML PO SYRP: 5.0000 mL | ORAL_SOLUTION | Freq: Four times a day (QID) | ORAL | Status: DC | PRN

## 2015-03-02 NOTE — Progress Notes (Signed)
Pre visit review using our clinic review tool, if applicable. No additional management support is needed unless otherwise documented below in the visit note. 

## 2015-03-02 NOTE — Progress Notes (Signed)
Subjective:    Patient ID: Stephen Dunlap, male    DOB: October 11, 1970, 44 y.o.   MRN: [id number]  HPI  Here to f/u recent tx for PNA (cxr neg for acute), tx with amoxil course, unfortunately with worsening nonprod cough, wheezing with sob/mild doe but Pt otherwise denies chest pain, orthopnea, PND, increased LE swelling, palpitations, dizziness or syncope.  Pt denies polydipsia, polyuria Past Medical History  Diagnosis Date  . Hypertension   . Diabetes mellitus   . Prostatitis   . Morbid obesity   . UTI (urinary tract infection)    Past Surgical History  Procedure Laterality Date  . Hernia repair    . Cysto bedside      reports that he has never smoked. He has never used smokeless tobacco. He reports that he drinks about 1.2 oz of alcohol per week. He reports that he does not use illicit drugs. family history includes Arthritis in his other; Cancer in his other; Diabetes in his other; Hyperlipidemia in his other; Hypertension in his other. Allergies  Allergen Reactions  . Invokana [Canagliflozin]     yeast   Current Outpatient Prescriptions on File Prior to Visit  Medication Sig Dispense Refill  . amoxicillin-clavulanate (AUGMENTIN) 875-125 MG per tablet Take 1 tablet by mouth 2 (two) times daily. 20 tablet 1  . chlorpheniramine-HYDROcodone (TUSSIONEX PENNKINETIC ER) 10-8 MG/5ML SUER Take 5 mLs by mouth every 12 (twelve) hours as needed for cough. 140 mL 0  . dicyclomine (BENTYL) 20 MG tablet Take one po every 6 hours prn 20 tablet 0  . Dulaglutide (TRULICITY) 6.28 ZM/6.2HU SOPN Inject 3 mLs into the skin.    Marland Kitchen empagliflozin (JARDIANCE) 10 MG TABS tablet Take 10 mg by mouth daily.    . fexofenadine (ALLEGRA) 180 MG tablet Take 1 tablet (180 mg total) by mouth daily. 90 tablet 3  . metFORMIN (GLUCOPHAGE) 1000 MG tablet Take 1 tablet (1,000 mg total) by mouth 2 (two) times daily with a meal. 180 tablet 1  . Multiple Vitamin (MULTIVITAMIN) tablet Take 1 tablet by mouth daily.      .  Nutritional Supplements (SOY PROTEIN SHAKE PO) Take by mouth daily. W/gym workout     . olmesartan-hydrochlorothiazide (BENICAR HCT) 20-12.5 MG per tablet Take 1 tablet by mouth daily. 90 tablet 3  . omeprazole (PRILOSEC) 20 MG capsule Take 1 capsule (20 mg total) by mouth daily. 90 capsule 3  . rosuvastatin (CRESTOR) 10 MG tablet Take 1 tablet (10 mg total) by mouth daily. 90 tablet 3   No current facility-administered medications on file prior to visit.   Review of Systems  Constitutional: Negative for unusual diaphoresis or night sweats HENT: Negative for ringing in ear or discharge Eyes: Negative for double vision or worsening visual disturbance.  Respiratory: Negative for choking and stridor.   Gastrointestinal: Negative for vomiting or other signifcant bowel change Genitourinary: Negative for hematuria or change in urine volume.  Musculoskeletal: Negative for other MSK pain or swelling Skin: Negative for color change and worsening wound.  Neurological: Negative for tremors and numbness other than noted  Psychiatric/Behavioral: Negative for decreased concentration or agitation other than above       Objective:   Physical Exam BP 138/92 mmHg  Pulse 92  Temp(Src) 99.6 F (37.6 C) (Oral)  Ht 6\' 5"  (1.956 m)  Wt 403 lb (182.8 kg)  BMI 47.78 kg/m2  SpO2 95% VS noted,  Constitutional: Pt appears in no significant distress HENT: Head: NCAT.  Right  Ear: External ear normal.  Left Ear: External ear normal.  Bilat tm's with mild erythema.  Max sinus areas non tender.  Pharynx with mild erythema, no exudate Eyes: . Pupils are equal, round, and reactive to light. Conjunctivae and EOM are normal Neck: Normal range of motion. Neck supple.  Cardiovascular: Normal rate and regular rhythm.   Pulmonary/Chest: Effort normal and breath sounds with mild bilat wheezes, no rales or rhonchi Neurological: Pt is alert. Not confused , motor grossly intact Skin: Skin is warm. No rash, no LE  edema Psychiatric: Pt behavior is normal. No agitation.   CLINICAL DATA: Cough, chest pain with coughing, symptoms for 2 weeks, history diabetes mellitus, hypertension  EXAM: CHEST 2 VIEW  COMPARISON: 07/04/2011  FINDINGS: Upper normal heart size.  Mediastinal contours and pulmonary vascularity normal.  Lungs clear.  No pleural effusion or pneumothorax.  Bones unremarkable.  IMPRESSION: No acute abnormalities.   Electronically Signed  By: Lavonia Dana M.D.  On: 02/26/2015 15:28    Assessment & Plan:

## 2015-03-02 NOTE — Telephone Encounter (Signed)
Patient wife called stating that his cough has gotten worse (green mucous) and diaphoretic. He has completed four days of antibiotic to no resolve. Wife states that she has been a Marine scientist for 26 years and knows that he should have seen a difference after four days. Please advise on what else can be done?

## 2015-03-02 NOTE — Telephone Encounter (Signed)
Has appt later today.

## 2015-03-02 NOTE — Patient Instructions (Signed)
Please take all new medication as prescribed - the antibiotic, cough medicine, and prednisone, and Inhaler if needed  Please continue all other medications as before, and refills have been done if requested.  Please have the pharmacy call with any other refills you may need.  Please keep your appointments with your specialists as you may have planned

## 2015-03-03 NOTE — Assessment & Plan Note (Signed)
Mild to mod, for antibx course,  to f/u any worsening symptoms or concerns 

## 2015-03-03 NOTE — Assessment & Plan Note (Signed)
Mild to mod, for cough med prn, proair hfa prn, prednisone po asd,  to f/u any worsening symptoms or concerns

## 2015-03-03 NOTE — Assessment & Plan Note (Signed)
stable overall by history and exam, recent data reviewed with pt, and pt to continue medical treatment as before,  to f/u any worsening symptoms or concerns BP Readings from Last 3 Encounters:  03/02/15 138/92  02/26/15 124/84  04/28/14 132/90

## 2015-06-19 ENCOUNTER — Other Ambulatory Visit: Payer: Self-pay | Admitting: Internal Medicine

## 2015-06-20 NOTE — Telephone Encounter (Signed)
Faxed

## 2015-07-27 ENCOUNTER — Telehealth: Payer: Self-pay

## 2015-07-27 NOTE — Telephone Encounter (Signed)
Mailbox full

## 2015-08-01 MED FILL — KETOCONAZOLE 2% CREAM: 2 | 15 days supply | Qty: 30 | Fill #0

## 2015-08-01 MED FILL — DOXYCYCLINE HYCLATE 100 MG: 100 | 5 days supply | Qty: 10 | Fill #0

## 2015-08-01 MED FILL — FLUCONAZOLE 150 MG TABLET: 150 | 2 days supply | Qty: 2 | Fill #0

## 2015-08-24 MED FILL — TRULICITY 1.5 MG/0.5 ML PEN: 1.5 | 28 days supply | Qty: 2 | Fill #2

## 2015-08-24 MED FILL — JARDIANCE 25 MG TABLET: 25 | 30 days supply | Qty: 30 | Fill #1

## 2015-08-24 MED FILL — JANUMET 50-1,000 MG TABLET: 50-1000 | 30 days supply | Qty: 60 | Fill #0

## 2015-09-21 MED FILL — JARDIANCE 25 MG TABLET: 25 | 30 days supply | Qty: 30 | Fill #2

## 2015-09-21 MED FILL — JANUMET 50-1,000 MG TABLET: 50-1000 | 30 days supply | Qty: 60 | Fill #1

## 2015-09-21 MED FILL — TRULICITY 1.5 MG/0.5 ML PEN: 1.5 | 28 days supply | Qty: 2 | Fill #3

## 2015-11-12 MED FILL — TRULICITY 1.5 MG/0.5 ML PEN: 1.5 | 28 days supply | Qty: 2 | Fill #4

## 2015-11-12 MED FILL — JARDIANCE 25 MG TABLET: 25 | 30 days supply | Qty: 30 | Fill #3

## 2015-11-12 MED FILL — JANUMET 50-1,000 MG TABLET: 50-1000 | 30 days supply | Qty: 60 | Fill #2

## 2015-12-27 MED FILL — MONTELUKAST SOD 10 MG TAB: 10 | 30 days supply | Qty: 30 | Fill #0

## 2015-12-27 MED FILL — FLUTICASONE PROP 50 MCG SPR: 50 | 30 days supply | Qty: 16 | Fill #0

## 2015-12-27 MED FILL — VENTOLIN HFA 90 MCG INHALER: 108 (90 BAS | 25 days supply | Qty: 18 | Fill #0

## 2015-12-27 MED FILL — IBUPROFEN 800 MG TABLET: 800 | 10 days supply | Qty: 30 | Fill #0

## 2015-12-27 MED FILL — PRAVASTATIN NA 20 MG TAB: 20 | 30 days supply | Qty: 30 | Fill #0

## 2015-12-27 MED FILL — JARDIANCE 25 MG TABLET: 25 | 30 days supply | Qty: 30 | Fill #0

## 2015-12-27 MED FILL — JANUMET 50-1,000 MG TABLET: 50-1000 | 30 days supply | Qty: 60 | Fill #0

## 2016-02-08 MED FILL — MELOXICAM 15 MG TABLET: 15 | 30 days supply | Qty: 30 | Fill #0

## 2016-03-26 ENCOUNTER — Other Ambulatory Visit: Payer: Self-pay | Admitting: Internal Medicine

## 2016-03-26 MED FILL — JANUMET 50-1,000 MG TABLET: 50-1000 | 30 days supply | Qty: 60 | Fill #1

## 2016-03-26 MED FILL — JARDIANCE 25 MG TABLET: 25 | 30 days supply | Qty: 30 | Fill #1

## 2016-03-27 ENCOUNTER — Other Ambulatory Visit: Payer: Self-pay

## 2016-03-27 NOTE — Telephone Encounter (Signed)
Clonazepam refill request  

## 2016-03-27 NOTE — Telephone Encounter (Signed)
He is overdue for an OV

## 2016-03-28 NOTE — Telephone Encounter (Signed)
LVM for pt to call back as soon as possible.   RE: rx for clonazepam has been denied. Pt needs an OV for refill.    Contacted pharmacy and instructed same.

## 2016-04-02 MED FILL — clonazePAM 1 MG TABS: 1 | 30 days supply | Qty: 30 | Fill #0

## 2016-04-29 MED FILL — JANUMET 50-1,000 MG TABLET: 50-1000 | 30 days supply | Qty: 60 | Fill #2

## 2016-04-29 MED FILL — JARDIANCE 25 MG TABLET: 25 | 30 days supply | Qty: 30 | Fill #2

## 2016-04-30 MED FILL — AMOX-CLAV 875-125 MG TABLET: 875-125 | 7 days supply | Qty: 14 | Fill #0

## 2016-04-30 MED FILL — KETOCONAZOLE 2% CREAM: 2 | 30 days supply | Qty: 60 | Fill #0

## 2016-07-01 MED FILL — KETOCONAZOLE 2% CREAM: 2 | 30 days supply | Qty: 60 | Fill #1

## 2016-07-01 MED FILL — TRULICITY 1.5 MG/0.5 ML PEN: 1.5 | 28 days supply | Qty: 2 | Fill #0

## 2016-07-01 MED FILL — clonazePAM 1 MG TABS: 1 | 30 days supply | Qty: 30 | Fill #1

## 2016-07-01 MED FILL — JARDIANCE 25 MG TABLET: 25 | 30 days supply | Qty: 30 | Fill #0

## 2016-07-01 MED FILL — JANUMET 50-1,000 MG TABLET: 50-1000 | 30 days supply | Qty: 60 | Fill #0

## 2016-08-06 DIAGNOSIS — E1149 Type 2 diabetes mellitus with other diabetic neurological complication: Secondary | ICD-10-CM | POA: Diagnosis not present

## 2016-08-06 DIAGNOSIS — J209 Acute bronchitis, unspecified: Secondary | ICD-10-CM | POA: Diagnosis not present

## 2016-08-06 DIAGNOSIS — E1140 Type 2 diabetes mellitus with diabetic neuropathy, unspecified: Secondary | ICD-10-CM | POA: Diagnosis not present

## 2016-08-06 DIAGNOSIS — E784 Other hyperlipidemia: Secondary | ICD-10-CM | POA: Diagnosis not present

## 2016-08-06 DIAGNOSIS — G4733 Obstructive sleep apnea (adult) (pediatric): Secondary | ICD-10-CM | POA: Diagnosis not present

## 2016-08-06 DIAGNOSIS — E1142 Type 2 diabetes mellitus with diabetic polyneuropathy: Secondary | ICD-10-CM | POA: Diagnosis not present

## 2016-08-06 DIAGNOSIS — K760 Fatty (change of) liver, not elsewhere classified: Secondary | ICD-10-CM | POA: Diagnosis not present

## 2016-08-06 DIAGNOSIS — Z1389 Encounter for screening for other disorder: Secondary | ICD-10-CM | POA: Diagnosis not present

## 2016-08-06 DIAGNOSIS — I10 Essential (primary) hypertension: Secondary | ICD-10-CM | POA: Diagnosis not present

## 2016-08-06 MED FILL — AZITHROMYCIN 250 MG TABLET: 250 | 5 days supply | Qty: 6 | Fill #0

## 2016-08-13 MED FILL — HYDROCODONE-HOMATROPINE SYR: 5-1.5 | 9 days supply | Qty: 180 | Fill #0

## 2016-08-20 MED FILL — JANUMET 50-1,000 MG TABLET: 50-1000 | 30 days supply | Qty: 60 | Fill #0

## 2016-08-20 MED FILL — JARDIANCE 25 MG TABLET: 25 | 30 days supply | Qty: 30 | Fill #0

## 2016-08-20 MED FILL — TRULICITY 1.5 MG/0.5 ML PEN: 1.5 | 28 days supply | Qty: 2 | Fill #0

## 2016-09-22 MED FILL — JARDIANCE 25 MG TABLET: 25 | 30 days supply | Qty: 30 | Fill #1

## 2016-09-22 MED FILL — clonazePAM 1 MG TABS: 1 | 30 days supply | Qty: 30 | Fill #2

## 2016-09-22 MED FILL — JANUMET 50-1,000 MG TABLET: 50-1000 | 30 days supply | Qty: 60 | Fill #1

## 2016-09-22 MED FILL — TRULICITY 1.5 MG/0.5 ML PEN: 1.5 | 28 days supply | Qty: 2 | Fill #1

## 2016-09-22 MED FILL — KETOCONAZOLE 2% CREAM: 2 | 30 days supply | Qty: 60 | Fill #2

## 2016-09-22 MED FILL — VOLTAREN 1% GEL: 1 | 25 days supply | Qty: 100 | Fill #0

## 2016-10-24 MED FILL — TRULICITY 1.5 MG/0.5 ML PEN: 1.5 | 28 days supply | Qty: 2 | Fill #2

## 2016-10-24 MED FILL — JARDIANCE 25 MG TABLET: 25 | 30 days supply | Qty: 30 | Fill #2

## 2016-10-24 MED FILL — JANUMET 50-1,000 MG TABLET: 50-1000 | 30 days supply | Qty: 60 | Fill #2

## 2016-11-18 DIAGNOSIS — I10 Essential (primary) hypertension: Secondary | ICD-10-CM | POA: Diagnosis not present

## 2016-11-18 DIAGNOSIS — Z6841 Body Mass Index (BMI) 40.0 and over, adult: Secondary | ICD-10-CM | POA: Diagnosis not present

## 2016-11-18 DIAGNOSIS — K760 Fatty (change of) liver, not elsewhere classified: Secondary | ICD-10-CM | POA: Diagnosis not present

## 2016-11-18 DIAGNOSIS — F329 Major depressive disorder, single episode, unspecified: Secondary | ICD-10-CM | POA: Diagnosis not present

## 2016-11-18 DIAGNOSIS — E1142 Type 2 diabetes mellitus with diabetic polyneuropathy: Secondary | ICD-10-CM | POA: Diagnosis not present

## 2016-11-18 DIAGNOSIS — E1149 Type 2 diabetes mellitus with other diabetic neurological complication: Secondary | ICD-10-CM | POA: Diagnosis not present

## 2016-11-18 DIAGNOSIS — E784 Other hyperlipidemia: Secondary | ICD-10-CM | POA: Diagnosis not present

## 2016-11-18 DIAGNOSIS — Z1389 Encounter for screening for other disorder: Secondary | ICD-10-CM | POA: Diagnosis not present

## 2016-11-18 DIAGNOSIS — L509 Urticaria, unspecified: Secondary | ICD-10-CM | POA: Diagnosis not present

## 2016-11-28 MED FILL — clonazePAM 1 MG TABS: 1 | 30 days supply | Qty: 30 | Fill #0

## 2016-11-28 MED FILL — JARDIANCE 25 MG TABLET: 25 | 30 days supply | Qty: 30 | Fill #3

## 2016-11-28 MED FILL — JANUMET 50-1,000 MG TABLET: 50-1000 | 30 days supply | Qty: 60 | Fill #3

## 2016-11-28 MED FILL — TRULICITY 1.5 MG/0.5 ML PEN: 1.5 | 28 days supply | Qty: 2 | Fill #3

## 2016-12-10 DIAGNOSIS — M7061 Trochanteric bursitis, right hip: Secondary | ICD-10-CM | POA: Diagnosis not present

## 2016-12-10 DIAGNOSIS — M25551 Pain in right hip: Secondary | ICD-10-CM | POA: Diagnosis not present

## 2016-12-23 ENCOUNTER — Encounter: Payer: Self-pay | Admitting: Allergy and Immunology

## 2016-12-23 ENCOUNTER — Other Ambulatory Visit: Payer: Self-pay

## 2016-12-23 ENCOUNTER — Ambulatory Visit (INDEPENDENT_AMBULATORY_CARE_PROVIDER_SITE_OTHER): Payer: 59 | Admitting: Allergy and Immunology

## 2016-12-23 VITALS — BP 160/110 | HR 96 | Temp 98.5°F | Resp 20 | Ht 76.0 in | Wt >= 6400 oz

## 2016-12-23 DIAGNOSIS — J3089 Other allergic rhinitis: Secondary | ICD-10-CM | POA: Diagnosis not present

## 2016-12-23 DIAGNOSIS — T7805XA Anaphylactic reaction due to tree nuts and seeds, initial encounter: Secondary | ICD-10-CM

## 2016-12-23 DIAGNOSIS — H1045 Other chronic allergic conjunctivitis: Secondary | ICD-10-CM | POA: Diagnosis not present

## 2016-12-23 DIAGNOSIS — H1010 Acute atopic conjunctivitis, unspecified eye: Secondary | ICD-10-CM

## 2016-12-23 MED ORDER — EPINEPHRINE 0.3 MG/0.3ML IJ SOAJ: 0.3000 mg | Freq: Once | INTRAMUSCULAR | 1 refills | Status: AC

## 2016-12-23 MED ORDER — OLOPATADINE HCL 0.7 % OP SOLN: 1.0000 [drp] | OPHTHALMIC | 5 refills | Status: DC

## 2016-12-23 MED ORDER — OLOPATADINE HCL 0.1 % OP SOLN: 1.0000 [drp] | Freq: Every day | OPHTHALMIC | 5 refills | Status: DC

## 2016-12-23 MED ORDER — MOMETASONE FUROATE 50 MCG/ACT NA SUSP: 1.0000 | Freq: Every day | NASAL | 5 refills | Status: DC

## 2016-12-23 MED ORDER — MONTELUKAST SODIUM 10 MG PO TABS: 10.0000 mg | ORAL_TABLET | Freq: Every day | ORAL | 5 refills | Status: DC

## 2016-12-23 MED FILL — OLOPATADINE HCL 0.1 % SOLN: 0.1 | 30 days supply | Qty: 5 | Fill #0

## 2016-12-23 MED FILL — MOMETASONE FUROATE 50 MCG S: 50 | 30 days supply | Qty: 17 | Fill #0

## 2016-12-23 MED FILL — MONTELUKAST SOD 10 MG TAB: 10 | 30 days supply | Qty: 30 | Fill #0

## 2016-12-23 NOTE — Progress Notes (Signed)
Dear Dr. Ronnald Ramp,  Thank you for referring Stephen Dunlap to the [facility] on 12/23/2016.   Below is a summation of this patient's evaluation and recommendations.  Thank you for your referral. I will keep you informed about this patient's response to treatment.   If you have any questions please do not hesitate to contact me.   Sincerely,  Jiles Prows, MD Allergy / Immunology [facility]   ______________________________________________________________________    NEW PATIENT NOTE  Referring Provider: Janith Lima, MD Primary Provider: Janith Lima, MD Date of office visit: 12/23/2016    Subjective:   Chief Complaint:  Stephen Dunlap (DOB: 20-Nov-1970) is a 46 y.o. male who presents to the clinic on 12/23/2016 with a chief complaint of New Patient (Initial Visit); Food Intolerance; and Allergies .     HPI: Maddock presents this clinic in evaluation of 2 issues.  First, he had an allergic reaction after eating butter almond ice cream October 2017. Within 15 minutes he developed lip swelling and global itchiness and throat closing with throat clearing. He took a Benadryl and he was better within about 60 minutes. He had no other associated systemic or constitutional symptoms. He has been tree nut free ever since that event. There was one additional incident where he did drink a hazelnut coffee creamer and developed itching. He does consume peanuts without any problem.   Second, he has a several year history of itchy red watery eyes and nasal congestion and sneezing and throat itchiness during the spring and fall of the season for which he takes Allegra-D and Flonase which does not really result in satisfactory response. Provoking factors for her symptoms include exposure to the outdoors.  As well, he was given a albuterol MDI to be used during a pneumonia several years ago. He does not  have a history of cold air induced bronchospastic symptoms or exercise induced bronchospastic symptoms and has not used that inhaler in years.  Past Medical History:  Diagnosis Date  . Diabetes mellitus   . Hypertension   . Morbid obesity ([place])   . Prostatitis   . UTI (urinary tract infection)     Past Surgical History:  Procedure Laterality Date  . cysto bedside    . HERNIA REPAIR      Allergies as of 12/23/2016      Reactions   Invokana [canagliflozin]    yeast      Medication List      albuterol 108 (90 Base) MCG/ACT inhaler Commonly known as:  PROVENTIL HFA;VENTOLIN HFA Inhale 2 puffs into the lungs every 6 (six) hours as needed for wheezing or shortness of breath.   clonazePAM 1 MG tablet Commonly known as:  KLONOPIN TAKE 1 TABLET BY MOUTH AT BEDTIME   dicyclomine 20 MG tablet Commonly known as:  BENTYL Take one po every 6 hours prn   fexofenadine 180 MG tablet Commonly known as:  ALLEGRA Take 1 tablet (180 mg total) by mouth daily.   HYDROcodone-homatropine 5-1.5 MG/5ML syrup Commonly known as:  HYCODAN Take 5 mLs by mouth every 6 (six) hours as needed for cough.   JARDIANCE 10 MG Tabs tablet Generic drug:  empagliflozin Take 10 mg by mouth daily.   multivitamin tablet Take 1 tablet by mouth daily.   olmesartan-hydrochlorothiazide 20-12.5 MG tablet Commonly known as:  BENICAR HCT Take 1 tablet by mouth daily.  omeprazole 20 MG capsule Commonly known as:  PRILOSEC Take 1 capsule (20 mg total) by mouth daily.   predniSONE 10 MG tablet Commonly known as:  DELTASONE 3 tabs by mouth per day for 3 days,2tabs per day for 3 days,1tab per day for 3 days   rosuvastatin 10 MG tablet Commonly known as:  CRESTOR Take 1 tablet (10 mg total) by mouth daily.   SOY PROTEIN SHAKE PO Take by mouth daily. W/gym workout   TRULICITY 1.24 PY/0.9XI Sopn Generic drug:  Dulaglutide Inject 3 mLs into the skin.       Review of systems negative except as noted  in HPI / PMHx or noted below:  Review of Systems  Constitutional: Negative.   HENT: Negative.   Eyes: Negative.   Respiratory: Negative.   Cardiovascular: Negative.   Gastrointestinal: Negative.   Genitourinary: Negative.   Musculoskeletal: Negative.   Skin: Negative.   Neurological: Negative.   Endo/Heme/Allergies: Negative.   Psychiatric/Behavioral: Negative.     Family History  Problem Relation Age of Onset  . Arthritis Other   . Cancer Other        breast  . Diabetes Other   . Hypertension Other   . Hyperlipidemia Other     Social History   Social History  . Marital status: Married    Spouse name: N/A  . Number of children: 3  . Years of education: N/A   Occupational History  . chemical process operator    Social History Main Topics  . Smoking status: Never Smoker  . Smokeless tobacco: Never Used  . Alcohol use 1.2 oz/week    2 Cans of beer per week     Comment: Socially  . Drug use: No  . Sexual activity: Yes   Other Topics Concern  . Not on file   Social History Narrative  . No narrative on file    Environmental and Social history  Lives in a house with a dry environment, no animals located inside the household, carpeting in the bedroom, no plastic on the bed or pillow, and no smoker and ongoing inside the household. He is Energy manager team.  Objective:   Vitals:   12/23/16 0843  BP: (!) 160/110  Pulse: 96  Resp: 20  Temp: 98.5 F (36.9 C)   Height: 6\' 4"  (193 cm) Weight: (!) 406 lb 9.6 oz (184.4 kg)  Physical Exam  Constitutional: He is well-developed, well-nourished, and in no distress.  HENT:  Head: Normocephalic. Head is without right periorbital erythema and without left periorbital erythema.  Right Ear: Tympanic membrane, external ear and ear canal normal.  Left Ear: Tympanic membrane, external ear and ear canal normal.  Nose: Nose normal. No mucosal edema or rhinorrhea.  Mouth/Throat: Uvula is midline,  oropharynx is clear and moist and mucous membranes are normal. No oropharyngeal exudate.  Eyes: Conjunctivae and lids are normal. Pupils are equal, round, and reactive to light.  Neck: Trachea normal. No tracheal tenderness present. No tracheal deviation present. No thyromegaly present.  Cardiovascular: Normal rate, regular rhythm, S1 normal, S2 normal and normal heart sounds.   No murmur heard. Pulmonary/Chest: Effort normal and breath sounds normal. No stridor. No tachypnea. No respiratory distress. He has no wheezes. He has no rales. He exhibits no tenderness.  Abdominal: Soft. He exhibits no distension and no mass. There is no hepatosplenomegaly. There is no tenderness. There is no rebound and no guarding.  Musculoskeletal: He exhibits no edema or tenderness.  Lymphadenopathy:  Head (right side): No tonsillar adenopathy present.       Head (left side): No tonsillar adenopathy present.    He has no cervical adenopathy.    He has no axillary adenopathy.  Neurological: He is alert. Gait normal.  Skin: No rash noted. He is not diaphoretic. No erythema. No pallor. Nails show no clubbing.  Psychiatric: Mood and affect normal.    Diagnostics: Allergy skin tests were performed. He demonstrated hypersensitivity to grasses, weeds, trees, cat, dog, and house dust mite. He also demonstrated hypersensitivity against almonds, hazelnuts, and soybean.  Spirometry was performed and demonstrated an FEV1 of 3.38 @ 80 % of predicted.  Assessment and Plan:    1. Other allergic rhinitis   2. Seasonal allergic conjunctivitis   3. Anaphylaxis due to tree nut, initial encounter     1. Allergen avoidance measures  2. Auvi-Q 0.3, Benadryl, M.D./ER evaluation for allergic reaction  3. Treat and prevent inflammation:   A. Nasonex 1-2 sprays each nostril one time per day  B. montelukast 10 mg tablet 1 time per day  4. If needed:   A. Pazeo - one drop each eye one time per day. Sample.  Coupon.  B. OTC antihistamine - Zyrtec/Allegra/Claritin  5. Further evaluation? Challenge? Immunotherapy?  6. Return to clinic in 6 months or earlier if problem  Jonathon is very atopic and would benefit from a course of immunotherapy and I have given him literature on this form of therapy during today's visit. He will once again attempt to try medical therapy to see if this results in good control of his symptoms. He obviously has a food allergy directed against tree nuts and should not consume tree nuts at this point in time. We have given him a epinephrine autoinjector to use should he ever develop a allergic reaction with inadvertent administration of these food products.  Jiles Prows, MD [facility]

## 2016-12-23 NOTE — Patient Instructions (Addendum)
  1. Allergen avoidance measures  2. Auvi-Q 0.3, Benadryl, M.D./ER evaluation for allergic reaction  3. Treat and prevent inflammation:   A. Nasonex 1-2 sprays each nostril one time per day  B. montelukast 10 mg tablet 1 time per day  4. If needed:   A. Pazeo - one drop each eye one time per day. Sample. Coupon.  B. OTC antihistamine - Zyrtec/Allegra/Claritin  5. Further evaluation? Challenge? Immunotherapy?  6. Return to clinic in 6 months or earlier if problem

## 2016-12-25 NOTE — Addendum Note (Signed)
Addended by: Gara Kroner L on: 12/25/2016 02:52 PM   Modules accepted: Orders

## 2016-12-26 DIAGNOSIS — H524 Presbyopia: Secondary | ICD-10-CM | POA: Diagnosis not present

## 2017-01-15 MED FILL — TRULICITY 1.5 MG/0.5 ML PEN: 1.5 | 28 days supply | Qty: 2 | Fill #4

## 2017-01-15 MED FILL — JARDIANCE 25 MG TABLET: 25 | 30 days supply | Qty: 30 | Fill #4

## 2017-01-15 MED FILL — JANUMET 50-1,000 MG TABLET: 50-1000 | 30 days supply | Qty: 60 | Fill #4

## 2017-01-15 MED FILL — clonazePAM 1 MG TABS: 1 | 30 days supply | Qty: 30 | Fill #1

## 2017-01-16 MED FILL — MONTELUKAST SOD 10 MG TAB: 10 | 30 days supply | Qty: 30 | Fill #1

## 2017-01-16 MED FILL — OLOPATADINE HCL 0.1 % SOLN: 0.1 | 30 days supply | Qty: 5 | Fill #1

## 2017-01-16 MED FILL — ONE TOUCH VERIO TEST STRIP: 20 days supply | Qty: 50 | Fill #0

## 2017-01-16 MED FILL — MOMETASONE FUROATE 50 MCG S: 50 | 30 days supply | Qty: 17 | Fill #1

## 2017-03-23 MED FILL — MOMETASONE FUROATE 50 MCG S: 50 | 30 days supply | Qty: 17 | Fill #2

## 2017-03-27 MED FILL — JARDIANCE 25 MG TABLET: 25 | 30 days supply | Qty: 30 | Fill #5

## 2017-03-27 MED FILL — TRULICITY 1.5 MG/0.5 ML PEN: 1.5 | 28 days supply | Qty: 2 | Fill #5

## 2017-03-27 MED FILL — clonazePAM 1 MG TABS: 1 | 30 days supply | Qty: 30 | Fill #2

## 2017-03-27 MED FILL — JANUMET 50-1,000 MG TABLET: 50-1000 | 30 days supply | Qty: 60 | Fill #5

## 2017-04-24 MED FILL — JARDIANCE 25 MG TABLET: 25 | 30 days supply | Qty: 30 | Fill #6

## 2017-04-24 MED FILL — JANUMET 50-1,000 MG TABLET: 50-1000 | 30 days supply | Qty: 60 | Fill #6

## 2017-04-24 MED FILL — clonazePAM 1 MG TABS: 1 | 30 days supply | Qty: 30 | Fill #3

## 2017-04-24 MED FILL — TRULICITY 1.5 MG/0.5 ML PEN: 1.5 | 28 days supply | Qty: 2 | Fill #6

## 2017-04-29 ENCOUNTER — Observation Stay (HOSPITAL_BASED_OUTPATIENT_CLINIC_OR_DEPARTMENT_OTHER)
Admission: EM | Admit: 2017-04-29 | Discharge: 2017-04-30 | Disposition: A | Discharge status: Home / Self Care | Payer: 59 | Attending: Internal Medicine | Admitting: Internal Medicine

## 2017-04-29 ENCOUNTER — Emergency Department (HOSPITAL_BASED_OUTPATIENT_CLINIC_OR_DEPARTMENT_OTHER): Payer: 59

## 2017-04-29 ENCOUNTER — Encounter (HOSPITAL_BASED_OUTPATIENT_CLINIC_OR_DEPARTMENT_OTHER): Payer: Self-pay | Admitting: Emergency Medicine

## 2017-04-29 DIAGNOSIS — R072 Precordial pain: Secondary | ICD-10-CM

## 2017-04-29 DIAGNOSIS — K219 Gastro-esophageal reflux disease without esophagitis: Secondary | ICD-10-CM | POA: Diagnosis present

## 2017-04-29 DIAGNOSIS — G4733 Obstructive sleep apnea (adult) (pediatric): Secondary | ICD-10-CM | POA: Diagnosis not present

## 2017-04-29 DIAGNOSIS — I10 Essential (primary) hypertension: Secondary | ICD-10-CM | POA: Diagnosis not present

## 2017-04-29 DIAGNOSIS — G459 Transient cerebral ischemic attack, unspecified: Secondary | ICD-10-CM | POA: Diagnosis not present

## 2017-04-29 DIAGNOSIS — Z6841 Body Mass Index (BMI) 40.0 and over, adult: Secondary | ICD-10-CM | POA: Diagnosis not present

## 2017-04-29 DIAGNOSIS — I6523 Occlusion and stenosis of bilateral carotid arteries: Secondary | ICD-10-CM | POA: Insufficient documentation

## 2017-04-29 DIAGNOSIS — M7061 Trochanteric bursitis, right hip: Secondary | ICD-10-CM | POA: Diagnosis not present

## 2017-04-29 DIAGNOSIS — Z79899 Other long term (current) drug therapy: Secondary | ICD-10-CM | POA: Insufficient documentation

## 2017-04-29 DIAGNOSIS — R600 Localized edema: Secondary | ICD-10-CM | POA: Diagnosis not present

## 2017-04-29 DIAGNOSIS — R079 Chest pain, unspecified: Secondary | ICD-10-CM | POA: Diagnosis not present

## 2017-04-29 DIAGNOSIS — E118 Type 2 diabetes mellitus with unspecified complications: Secondary | ICD-10-CM | POA: Diagnosis not present

## 2017-04-29 DIAGNOSIS — Z794 Long term (current) use of insulin: Secondary | ICD-10-CM | POA: Diagnosis not present

## 2017-04-29 DIAGNOSIS — E785 Hyperlipidemia, unspecified: Secondary | ICD-10-CM | POA: Diagnosis present

## 2017-04-29 DIAGNOSIS — E1165 Type 2 diabetes mellitus with hyperglycemia: Secondary | ICD-10-CM | POA: Insufficient documentation

## 2017-04-29 DIAGNOSIS — R29810 Facial weakness: Secondary | ICD-10-CM | POA: Diagnosis not present

## 2017-04-29 DIAGNOSIS — M25551 Pain in right hip: Secondary | ICD-10-CM | POA: Diagnosis not present

## 2017-04-29 LAB — COMPREHENSIVE METABOLIC PANEL
ALT: 34 U/L (ref 17–63)
ANION GAP: 8 (ref 5–15)
AST: 23 U/L (ref 15–41)
Albumin: 4.4 g/dL (ref 3.5–5.0)
Alkaline Phosphatase: 86 U/L (ref 38–126)
BUN: 13 mg/dL (ref 6–20)
CALCIUM: 10 mg/dL (ref 8.9–10.3)
CO2: 25 mmol/L (ref 22–32)
Chloride: 104 mmol/L (ref 101–111)
Creatinine, Ser: 0.99 mg/dL (ref 0.61–1.24)
GFR calc Af Amer: >60 mL/min (ref >60)
GFR calc non Af Amer: >60 mL/min (ref >60)
Glucose, Bld: 239 mg/dL — ABNORMAL HIGH (ref 65–99)
POTASSIUM: 4.6 mmol/L (ref 3.5–5.1)
Sodium: 137 mmol/L (ref 135–145)
Total Bilirubin: 0.4 mg/dL (ref 0.3–1.2)
Total Protein: 8 g/dL (ref 6.5–8.1)

## 2017-04-29 LAB — CBC WITH DIFFERENTIAL/PLATELET
Basophils Absolute: 0.1 10*3/uL (ref 0.0–0.1)
Basophils Relative: 1 %
Eosinophils Absolute: 0 10*3/uL (ref 0.0–0.7)
Eosinophils Relative: 0 %
HEMATOCRIT: 43.9 % (ref 39.0–52.0)
Hemoglobin: 14.8 g/dL (ref 13.0–17.0)
LYMPHS PCT: 17 %
Lymphs Abs: 1.6 10*3/uL (ref 0.7–4.0)
MCH: 28.2 pg (ref 26.0–34.0)
MCHC: 33.7 g/dL (ref 30.0–36.0)
MCV: 83.6 fL (ref 78.0–100.0)
MONOS PCT: 4 %
Monocytes Absolute: 0.4 10*3/uL (ref 0.1–1.0)
Neutro Abs: 7.1 10*3/uL (ref 1.7–7.7)
Neutrophils Relative %: 78 %
Platelets: 272 10*3/uL (ref 150–400)
RBC: 5.25 MIL/uL (ref 4.22–5.81)
RDW: 13.2 % (ref 11.5–15.5)
WBC: 9.2 10*3/uL (ref 4.0–10.5)

## 2017-04-29 LAB — BRAIN NATRIURETIC PEPTIDE: B Natriuretic Peptide: 23.3 pg/mL (ref 0.0–100.0)

## 2017-04-29 LAB — TROPONIN I: Troponin I: <0.03 ng/mL (ref <0.03)

## 2017-04-29 LAB — CBG MONITORING, ED: Glucose-Capillary: 245 mg/dL — ABNORMAL HIGH (ref 65–99)

## 2017-04-29 NOTE — ED Provider Notes (Signed)
Emergency Department Provider Note   I have reviewed the triage vital signs and the nursing notes.   HISTORY  Chief Complaint Facial Droop   HPI Stephen Dunlap is a 46 y.o. male with PMH of DM, HTN, and obesity presents to the emergency department for evaluation of acute onset left face droop starting at exactly 6:08 PM today. Patient states that he felt a pulling sensation to the left side of his face. He felt like his lip was being pulled upwards but his wife, who is a Marine scientist, noted a droop to the corner of the mouth. She also felt like the left eye was working in a slightly different direction than the left. Patient denies any double vision or blurry vision. No weakness or numbness in the upper or lower extremities. No speech changes. No difficulty swallowing. Symptoms lasted for approximately 45 seconds and then resolve spontaneously. Patient with no history of prior symptoms.  Patient's wife also reports intermittent pulling sensation in the right sided chest with last episode being 3 days prior. She has noticed some swelling in the lower extremities. In the setting of these new issues when the patient had face droop she insisted that he presented to the emergency department for further evaluation.   Past Medical History:  Diagnosis Date  . Diabetes mellitus   . Hypertension   . Morbid obesity ([place])   . Prostatitis   . UTI (urinary tract infection)     Patient Active Problem List   Diagnosis Date Noted  . Acute bronchitis 03/02/2015  . Wheezing 03/02/2015  . Cough 02/26/2015  . Pneumonia, organism unspecified([place]) 02/26/2015  . Shift work sleep disorder 03/03/2014  . GERD (gastroesophageal reflux disease) 06/22/2013  . Depression, acute 06/22/2013  . Allergic rhinitis, cause unspecified 12/15/2012  . Routine general medical examination at a health care facility 12/02/2012  . OSA (obstructive sleep apnea) 11/17/2011  . Hyperlipidemia with target LDL less than 100  10/31/2010  . Diabetes mellitus type 2 with complications ([place]) 24/40/1027  . OBESITY, MORBID 06/10/2010  . Essential hypertension 06/10/2010    Past Surgical History:  Procedure Laterality Date  . cysto bedside    . HERNIA REPAIR      Current Outpatient Rx  . Order #: [id number] Class: Historical Med  . Order #: [id number] Class: Historical Med  . Order #: [id number] Class: Historical Med  . Order #: [id number] Class: Normal  . Order #: [id number] Class: Print  . Order #: [id number] Class: Normal  . Order #: [id number] Class: Normal  . Order #: [id number] Class: Print  . Order #: [id number] Class: Normal  . Order #: [id number] Class: Normal  . Order #: [id number] Class: Historical Med  . Order #: [id number] Class: Historical Med  . Order #: [id number] Class: Normal  . Order #: [id number] Class: Normal  . Order #: [id number] Class: Normal  . Order #: [id number] Class: Normal    Allergies Other and Invokana [canagliflozin]  Family History  Problem Relation Age of Onset  . Arthritis Other   . Cancer Other        breast  . Diabetes Other   . Hypertension Other   . Hyperlipidemia Other     Social History Social History  Substance Use Topics  . Smoking status: Never Smoker  . Smokeless tobacco: Never Used  . Alcohol use 1.2 oz/week    2 Cans of beer per week     Comment: Socially    Review of Systems  Constitutional: No fever/chills Eyes: No visual changes. ENT: No sore throat. Cardiovascular: Positive chest pain  3 days prior.  Respiratory: Denies shortness of breath. Gastrointestinal: No abdominal pain.  No nausea, no vomiting.  No diarrhea.  No constipation. Genitourinary: Negative for dysuria. Musculoskeletal: Negative for back pain. Skin: Negative for rash. Neurological: Negative for headaches, focal weakness or numbness. Positive left face droop (resolved)  10-point ROS otherwise negative.  ____________________________________________   PHYSICAL EXAM:  VITAL SIGNS: ED Triage  Vitals  Enc Vitals Group     BP 04/29/17 1846 (!) 143/103     Pulse Rate 04/29/17 1846 (!) 101     Resp 04/29/17 1846 20     Temp 04/29/17 1846 98.5 F (36.9 C)     Temp Source 04/29/17 1846 Oral     SpO2 04/29/17 1846 97 %     Weight 04/29/17 1846 (!) 415 lb (188.2 kg)     Height 04/29/17 1846 6\' 5"  (1.956 m)     Pain Score 04/29/17 1844 0   Constitutional: Alert and oriented. Well appearing and in no acute distress. Eyes: Conjunctivae are normal. PERRL. EOMI. Head: Atraumatic. Nose: No congestion/rhinnorhea. Mouth/Throat: Mucous membranes are moist.  Neck: No stridor.  Cardiovascular: Normal rate, regular rhythm. Good peripheral circulation. Grossly normal heart sounds.   Respiratory: Normal respiratory effort.  No retractions. Lungs CTAB. Gastrointestinal: Soft and nontender. No distention.  Musculoskeletal: No lower extremity tenderness nor edema. No gross deformities of extremities. Neurologic:  Normal speech and language. No gross focal neurologic deficits are appreciated. Normal CN exam 2-12. No pronator drift. Normal gait. Normal finger-to-nose.  Skin:  Skin is warm, dry and intact. No rash noted.  ____________________________________________   LABS (all labs ordered are listed, but only abnormal results are displayed)  Labs Reviewed  COMPREHENSIVE METABOLIC PANEL - Abnormal; Notable for the following:       Result Value   Glucose, Bld 239 (*)    All other components within normal limits  CBG MONITORING, ED - Abnormal; Notable for the following:    Glucose-Capillary 245 (*)    All other components within normal limits  CBC WITH DIFFERENTIAL/PLATELET  TROPONIN I  BRAIN NATRIURETIC PEPTIDE   ____________________________________________  EKG   EKG Interpretation  Date/Time:  Wednesday April 29 2017 19:01:44 EDT Ventricular Rate:  98 PR Interval:    QRS Duration: 106 QT Interval:  352 QTC Calculation: 450 R Axis:   -2 Text Interpretation:  Sinus rhythm No  STEMI.  Confirmed by Nanda Quinton [phone]) on 04/29/2017 7:37:45 PM       ____________________________________________  RADIOLOGY  Dg Chest 2 View  Result Date: 04/29/2017 CLINICAL DATA:  Initial evaluation for acute chest pain. EXAM: CHEST  2 VIEW COMPARISON:  Prior radiograph from 02/26/2015. FINDINGS: Transverse heart size at the upper limits of normal, stable. Mediastinal silhouette within normal limits. Elevation left hemidiaphragm, unchanged. No airspace consolidation, pleural effusion, or pulmonary edema is identified. There is no pneumothorax. No acute osseous abnormality identified. IMPRESSION: 1. No active cardiopulmonary disease. 2. Mild elevation of the left hemidiaphragm, similar to prior, and likely chronic. Electronically Signed   By: Jeannine Boga M.D.   On: 04/29/2017 20:06   Ct Head Wo Contrast  Result Date: 04/29/2017 CLINICAL DATA:  Left-sided facial droop tonight EXAM: CT HEAD WITHOUT CONTRAST TECHNIQUE: Contiguous axial images were obtained from the base of the skull through the vertex without intravenous contrast. COMPARISON:  None. FINDINGS: Brain: No mass effect, midline shift, or acute intracranial hemorrhage. Minimal chronic ischemic changes in the periventricular white matter. Ventricular system is within normal limits. Vascular:  No hyperdense vessel or unexpected calcification. Skull: Cranium is intact. Sinuses/Orbits: Visualized paranasal sinuses are clear. Other: Noncontributory IMPRESSION: No acute intracranial pathology. Electronically Signed   By: Marybelle Killings M.D.   On: 04/29/2017 19:59    ____________________________________________   PROCEDURES  Procedure(s) performed:   Procedures  None ____________________________________________   INITIAL IMPRESSION / ASSESSMENT AND PLAN / ED COURSE  Pertinent labs & imaging results that were available during my care of the patient were reviewed by me and considered in my medical decision making (see chart  for details).  Patient resents emergency department for evaluation of left face droop that occurred just after 6 PM today. The patient's wife, who is a Marine scientist, states that this seemed more consistent with stroke rather than muscle spasm. Patient also been having intermittent chest discomfort and has trace lower extremity edema on exam. Plan for TIA evaluation as well as CP w/u.   09:06 PM Patient's symptoms have completely resolved. No chest pain at this time. Patient with multiple stroke risk factors. Plan for admission for TIA evaluation and CP w/u.   Spoke with Dr. Leonel Ramsay who will consult on patient upon arrival to [facility]   Discussed patient's case with Hospitalist, Dr. Blaine Hamper to request admission. Patient and family (if present) updated with plan. Care transferred to Hospitalist service.  I reviewed all nursing notes, vitals, pertinent old records, EKGs, labs, imaging (as available).  ____________________________________________  FINAL CLINICAL IMPRESSION(S) / ED DIAGNOSES  Final diagnoses:  TIA (transient ischemic attack)  Precordial chest pain     MEDICATIONS GIVEN DURING THIS VISIT:  Medications - No data to display   NEW OUTPATIENT MEDICATIONS STARTED DURING THIS VISIT:  None   Note:  This document was prepared using Dragon voice recognition software and may include unintentional dictation errors.  Nanda Quinton, MD Emergency Medicine    Tashona Calk, [facility], MD 04/29/17 [phone]

## 2017-04-29 NOTE — Progress Notes (Addendum)
This is a no charge note  Transfer from [facility] per Dr. Laverta Baltimore  46 year old male with past medical history of obesity, hypertension, diabetes mellitus, GERD, anxiety, prostatitis, who presents with transient left facial droop, lasted for about 45 seconds. Patient also has chest discomfort. CT head is negative for acute intracranial abnormalities.  Patient was found to have WBC 9.2, troponin negative, BNP 23.3, electrolytes renal function okay, temperature normal, tachycardia, tachypnea, O2 sat 95% on room air. Chest x-rays negative for infiltration, but showed chronic left hemidiaphragm elevation. Patient is placed on telemetry bed for observation. Neurology, Dr. Leonel Ramsay was consulted.    Please call manager of [facility] at [phone] when pt arrives to floor   Ivor Costa, MD  [facility] Pager [phone]  If 7PM-7AM, please contact night-coverage [email or web address] Password [email or web address] 04/29/2017, 10:02 PM

## 2017-04-29 NOTE — H&P (Signed)
History and Physical    [name] [id number]:[id number] DOB: 1970/09/28 DOA: 04/29/2017  Referring MD/NP/PA:   PCP: Reynold Bowen, MD   Patient coming from:  The patient is coming from home.  At baseline, pt is independent for most of ADL. SNF  Assistant living facility   [facility] center.       Chief Complaint: left facial droop.  HPI: Stephen Dunlap is a 46 y.o. male with medical history significant of obesity, hypertension, diabetes mellitus, GERD, anxiety, prostatitis, who presents with transient left facial droop.  Pt state that he has had one episode of left facial droop at about 6:00 PM. He states that he felt like his lip was being pulled upwards but his wife, who is a nurse, noted a droop to the corner of the mouth. She also felt like the left eye was working in a slightly different direction than the left. Symptoms lasted for approximately 45 seconds and then resolve spontaneously. Pt does not have hearing loss, unilateral weakness, numbness or tingling in extremities. Patient states that he had mild right sided chest discomfort earlier, which has resolved completely. Currently patient does not have chest pain or shortness of breath. No cough, fever, chills, nausea, vomiting, diarrhea, abdominal pain, symptoms of UTI.  ED Course: pt was found to have WBC 9.2, troponin negative, BNP 23.3, electrolytes renal function okay, temperature normal, tachycardia, tachypnea, O2 sat 95% on room air. Chest x-rays negative for infiltration, but showed chronic left hemidiaphragm elevation. Patient is placed on telemetry bed for observation. Neurology, Dr. Leonel Ramsay was consulted.   Review of Systems:   General: no fevers, chills, no body weight gain. HEENT: no blurry vision, hearing changes or sore throat Respiratory: no dyspnea, coughing, wheezing CV: no chest pain, no palpitations GI: no nausea, vomiting, abdominal pain, diarrhea, constipation GU: no dysuria, burning on urination, increased  urinary frequency, hematuria  Ext: has trace leg edema Neuro: no unilateral weakness, numbness, or tingling, no hearing loss. Has left facial droop. Skin: no rash, no skin tear. MSK: No muscle spasm, no deformity, no limitation of range of movement in spin Heme: No easy bruising.  Travel history: No recent long distant travel.  Allergy:  Allergies  Allergen Reactions  . Other Anaphylaxis    Tree nuts  . Invokana [Canagliflozin]     yeast    Past Medical History:  Diagnosis Date  . Diabetes mellitus   . Hypertension   . Morbid obesity ([place])   . Prostatitis   . UTI (urinary tract infection)     Past Surgical History:  Procedure Laterality Date  . cysto bedside    . HERNIA REPAIR      Social History:  reports that he has never smoked. He has never used smokeless tobacco. He reports that he drinks about 1.2 oz of alcohol per week . He reports that he does not use drugs.  Family History:  Family History  Problem Relation Age of Onset  . Arthritis Other   . Cancer Other        breast  . Diabetes Other   . Hypertension Other   . Hyperlipidemia Other      Prior to Admission medications   Medication Sig Start Date End Date Taking? Authorizing Provider  Dulaglutide (TRULICITY) 4.48 JE/5.6DJ SOPN Inject 3 mLs into the skin.   Yes [provider]  empagliflozin (JARDIANCE) 10 MG TABS tablet Take 10 mg by mouth daily.   Yes [provider]  JANUMET 50-1000 MG  tablet  11/28/16  Yes [provider]  albuterol (PROVENTIL HFA;VENTOLIN HFA) 108 (90 BASE) MCG/ACT inhaler Inhale 2 puffs into the lungs every 6 (six) hours as needed for wheezing or shortness of breath. 03/02/15   Biagio Borg, MD  clonazePAM (KLONOPIN) 1 MG tablet TAKE 1 TABLET BY MOUTH AT BEDTIME 06/19/15   Janith Lima, MD  dicyclomine (BENTYL) 20 MG tablet Take one po every 6 hours prn 12/24/12   Zehr, Laban Emperor, PA-C  fexofenadine (ALLEGRA) 180 MG tablet Take 1 tablet (180 mg total) by  mouth daily. 02/26/15   Janith Lima, MD  HYDROcodone-homatropine [facility]) 5-1.5 MG/5ML syrup Take 5 mLs by mouth every 6 (six) hours as needed for cough. 03/02/15   Biagio Borg, MD  mometasone (NASONEX) 50 MCG/ACT nasal spray Place 1-2 sprays into the nose daily. 12/23/16   Kozlow, Donnamarie Poag, MD  montelukast (SINGULAIR) 10 MG tablet Take 1 tablet (10 mg total) by mouth at bedtime. 12/23/16   Kozlow, Donnamarie Poag, MD  Multiple Vitamin (MULTIVITAMIN) tablet Take 1 tablet by mouth daily.      [provider]  Nutritional Supplements (SOY PROTEIN SHAKE PO) Take by mouth daily. W/gym workout     [provider]  olmesartan-hydrochlorothiazide (BENICAR HCT) 20-12.5 MG per tablet Take 1 tablet by mouth daily. Patient not taking: Reported on 12/23/2016 03/03/14   Janith Lima, MD  olopatadine (PATANOL) 0.1 % ophthalmic solution Place 1 drop into both eyes daily. 12/23/16   Kozlow, Donnamarie Poag, MD  Olopatadine HCl (PAZEO) 0.7 % SOLN Place 1 drop into both eyes 1 day or 1 dose. 12/23/16   Kozlow, Donnamarie Poag, MD  omeprazole (PRILOSEC) 20 MG capsule Take 1 capsule (20 mg total) by mouth daily. 02/26/15   Janith Lima, MD    Physical Exam: Vitals:   04/29/17 2200 04/29/17 2230 04/29/17 2255 04/29/17 2346  BP: 140/79 137/86  (!) 163/101  Pulse: 92 87  95  Resp: 19 (!) 22  20  Temp:   98.9 F (37.2 C) 99 F (37.2 C)  TempSrc:   Oral Oral  SpO2: 95% 97%  95%  Weight:    (!) 188.2 kg (414 lb 15.9 oz)  Height:    6\' 5"  (1.956 m)   General: Not in acute distress HEENT:       Eyes: PERRL, EOMI, no scleral icterus.       ENT: No discharge from the ears and nose, no pharynx injection, no tonsillar enlargement.        Neck: No JVD, no bruit, no mass felt. Heme: No neck lymph node enlargement. Cardiac: S1/S2, RRR, No murmurs, No gallops or rubs. Respiratory: No rales, wheezing, rhonchi or rubs. GI: Soft, nondistended, nontender, no rebound pain, no organomegaly, BS present. GU: No hematuria Ext: has  trace leg edema bilaterally. 2+DP/PT pulse bilaterally. Musculoskeletal: No joint deformities, No joint redness or warmth, no limitation of ROM in spin. Skin: No rashes.  Neuro: Alert, oriented X3, cranial nerves II-XII grossly intact, moves all extremities normally. Muscle strength 5/5 in all extremities, sensation to light touch intact. Brachial reflex 2+ bilaterally. Knee reflex 1+ bilaterally. Negative Babinski's sign.  Psych: Patient is not psychotic, no suicidal or hemocidal ideation.  Labs on Admission: I have personally reviewed following labs and imaging studies  CBC:  Recent Labs Lab 04/29/17 1915  WBC 9.2  NEUTROABS 7.1  HGB 14.8  HCT 43.9  MCV 83.6  PLT 357   Basic Metabolic  Panel:  Recent Labs Lab 04/29/17 1915  NA 137  K 4.6  CL 104  CO2 25  GLUCOSE 239*  BUN 13  CREATININE 0.99  CALCIUM 10.0   GFR: Estimated Creatinine Clearance: 171.5 mL/min (by C-G formula based on SCr of 0.99 mg/dL). Liver Function Tests:  Recent Labs Lab 04/29/17 1915  AST 23  ALT 34  ALKPHOS 86  BILITOT 0.4  PROT 8.0  ALBUMIN 4.4   No results for input(s): LIPASE, AMYLASE in the last 168 hours. No results for input(s): AMMONIA in the last 168 hours. Coagulation Profile: No results for input(s): INR, PROTIME in the last 168 hours. Cardiac Enzymes:  Recent Labs Lab 04/29/17 1915  TROPONINI <0.03   BNP (last 3 results) No results for input(s): PROBNP in the last 8760 hours. HbA1C: No results for input(s): HGBA1C in the last 72 hours. CBG:  Recent Labs Lab 04/29/17 1847  GLUCAP 245*   Lipid Profile: No results for input(s): CHOL, HDL, LDLCALC, TRIG, CHOLHDL, LDLDIRECT in the last 72 hours. Thyroid Function Tests: No results for input(s): TSH, T4TOTAL, FREET4, T3FREE, THYROIDAB in the last 72 hours. Anemia Panel: No results for input(s): VITAMINB12, FOLATE, FERRITIN, TIBC, IRON, RETICCTPCT in the last 72 hours. Urine analysis:    Component Value Date/Time    COLORURINE YELLOW 02/26/2015 [place] 02/26/2015 1533   LABSPEC 1.020 02/26/2015 1533   PHURINE 6.0 02/26/2015 1533   GLUCOSEU >=1000 (A) 02/26/2015 1533   HGBUR NEGATIVE 02/26/2015 1533   BILIRUBINUR NEGATIVE 02/26/2015 1533   KETONESUR NEGATIVE 02/26/2015 1533   PROTEINUR 30 (A) 09/23/2009 0931   UROBILINOGEN 0.2 02/26/2015 1533   NITRITE NEGATIVE 02/26/2015 1533   LEUKOCYTESUR NEGATIVE 02/26/2015 1533   Sepsis Labs: @LABRCNTIP (procalcitonin:4,lacticidven:4) )No results found for this or any previous visit (from the past 240 hour(s)).   Radiological Exams on Admission: Dg Chest 2 View  Result Date: 04/29/2017 CLINICAL DATA:  Initial evaluation for acute chest pain. EXAM: CHEST  2 VIEW COMPARISON:  Prior radiograph from 02/26/2015. FINDINGS: Transverse heart size at the upper limits of normal, stable. Mediastinal silhouette within normal limits. Elevation left hemidiaphragm, unchanged. No airspace consolidation, pleural effusion, or pulmonary edema is identified. There is no pneumothorax. No acute osseous abnormality identified. IMPRESSION: 1. No active cardiopulmonary disease. 2. Mild elevation of the left hemidiaphragm, similar to prior, and likely chronic. Electronically Signed   By: Jeannine Boga M.D.   On: 04/29/2017 20:06   Ct Head Wo Contrast  Result Date: 04/29/2017 CLINICAL DATA:  Left-sided facial droop tonight EXAM: CT HEAD WITHOUT CONTRAST TECHNIQUE: Contiguous axial images were obtained from the base of the skull through the vertex without intravenous contrast. COMPARISON:  None. FINDINGS: Brain: No mass effect, midline shift, or acute intracranial hemorrhage. Minimal chronic ischemic changes in the periventricular white matter. Ventricular system is within normal limits. Vascular: No hyperdense vessel or unexpected calcification. Skull: Cranium is intact. Sinuses/Orbits: Visualized paranasal sinuses are clear. Other: Noncontributory IMPRESSION: No acute  intracranial pathology. Electronically Signed   By: Marybelle Killings M.D.   On: 04/29/2017 19:59     EKG: Independently reviewed.     Assessment/Plan Principal Problem:   TIA (transient ischemic attack) Active Problems:   OBESITY, MORBID   Essential hypertension   Diabetes mellitus type 2 with complications (HCC)   Hyperlipidemia with target LDL less than 100   GERD (gastroesophageal reflux disease)   TIA (transient ischemic attack): Patient had transient left facial droop and vision change, concerning for TIA.  CT head negative for acute intracranial abnormalities. Neurology, Dr. Leonel Ramsay was consulted.  - will place to tele bed - Neurology was consulted by EDP, will follow up recommendations. -Atrial fibrillation: not present  - Risk factor modification: HgbA1c, fasting lipid panel - MRI, MRA of the brain without contrast  - PT consult, OT consult - Bedside swallowing screen was ordered - 2 d Echocardiogram  - Ekg  - Carotid dopplers  - Aspirin - check UDS   Hyperlipidemia with target LDL less than 100: Last LDL was 138 on 02/26/15. Pt is not taking statin -start lipitor -Check FLP  HTN: Blood pressure 140/93. Patient is not taking oral medication Benicar-HCTZ -IV hydralazine when necessary  GERD: -Protonix  Diabetes mellitus type 2 with complications: Last O1L 7.3 on 02/25/17, not well controled. Patient is taking Jardiance, Janumet and Tulicity at home -SSI  DVT ppx: SQ Lovenox Code Status: Full code Family Communication: None at bed side. Disposition Plan:  Anticipate discharge back to previous home environment Consults called: Neurology, Dr. Leonel Ramsay Admission status: Obs / tele     Date of Service 04/30/2017    Ivor Costa [facility] Pager [phone]  If 7PM-7AM, please contact night-coverage [email or web address] Password [email or web address] 04/30/2017, 12:16 AM

## 2017-04-29 NOTE — ED Triage Notes (Signed)
Per wife, pt had L side facial droop at 6:08 this evening lasting approximately 45 seconds. It has resolved at this time. No other symptoms were involved. Pt reports he feels normal at this time.

## 2017-04-29 NOTE — ED Notes (Signed)
ED Provider at bedside. 

## 2017-04-29 NOTE — Progress Notes (Signed)
Pt admitted from [facility] with stroke like symptoms, alert and oriented, denies any pain. Pt settled in bed with call light at bedside, tele monitor put and verified on pt, admitting doc and notified of pt's arrival, safety concern addressed accordingly, was however reassured and will continue to monitor. Obasogie-Asidi, Leanza Shepperson Efe

## 2017-04-30 ENCOUNTER — Observation Stay (HOSPITAL_BASED_OUTPATIENT_CLINIC_OR_DEPARTMENT_OTHER): Payer: 59

## 2017-04-30 ENCOUNTER — Other Ambulatory Visit (HOSPITAL_COMMUNITY): Payer: 59

## 2017-04-30 ENCOUNTER — Observation Stay (HOSPITAL_COMMUNITY): Payer: 59

## 2017-04-30 DIAGNOSIS — G459 Transient cerebral ischemic attack, unspecified: Secondary | ICD-10-CM

## 2017-04-30 DIAGNOSIS — E1165 Type 2 diabetes mellitus with hyperglycemia: Secondary | ICD-10-CM | POA: Diagnosis not present

## 2017-04-30 DIAGNOSIS — R072 Precordial pain: Secondary | ICD-10-CM | POA: Diagnosis not present

## 2017-04-30 DIAGNOSIS — E118 Type 2 diabetes mellitus with unspecified complications: Secondary | ICD-10-CM | POA: Diagnosis not present

## 2017-04-30 DIAGNOSIS — K219 Gastro-esophageal reflux disease without esophagitis: Secondary | ICD-10-CM | POA: Diagnosis not present

## 2017-04-30 DIAGNOSIS — I10 Essential (primary) hypertension: Secondary | ICD-10-CM | POA: Diagnosis not present

## 2017-04-30 DIAGNOSIS — Z79899 Other long term (current) drug therapy: Secondary | ICD-10-CM | POA: Diagnosis not present

## 2017-04-30 DIAGNOSIS — E785 Hyperlipidemia, unspecified: Secondary | ICD-10-CM | POA: Diagnosis not present

## 2017-04-30 DIAGNOSIS — R29810 Facial weakness: Secondary | ICD-10-CM | POA: Diagnosis not present

## 2017-04-30 DIAGNOSIS — R600 Localized edema: Secondary | ICD-10-CM | POA: Diagnosis not present

## 2017-04-30 DIAGNOSIS — I6523 Occlusion and stenosis of bilateral carotid arteries: Secondary | ICD-10-CM | POA: Diagnosis not present

## 2017-04-30 DIAGNOSIS — G4733 Obstructive sleep apnea (adult) (pediatric): Secondary | ICD-10-CM | POA: Diagnosis not present

## 2017-04-30 LAB — RAPID URINE DRUG SCREEN, HOSP PERFORMED
Amphetamines: NOT DETECTED
BARBITURATES: NOT DETECTED
BENZODIAZEPINES: NOT DETECTED
Cocaine: NOT DETECTED
Opiates: NOT DETECTED
Tetrahydrocannabinol: NOT DETECTED

## 2017-04-30 LAB — VAS US CAROTID
LCCADSYS: -88 cm/s
LCCAPDIAS: 11 cm/s
LEFT ECA DIAS: -21 cm/s
LEFT VERTEBRAL DIAS: 16 cm/s
LICADDIAS: -21 cm/s
LICAPDIAS: -16 cm/s
LICAPSYS: -83 cm/s
Left CCA dist dias: -14 cm/s
Left CCA prox sys: 90 cm/s
Left ICA dist sys: -70 cm/s
RCCAPDIAS: -13 cm/s
RCCAPSYS: -130 cm/s
RIGHT ECA DIAS: -18 cm/s
RIGHT VERTEBRAL DIAS: 15 cm/s
Right cca dist sys: -63 cm/s

## 2017-04-30 LAB — GLUCOSE, CAPILLARY
GLUCOSE-CAPILLARY: 183 mg/dL — AB (ref 65–99)
GLUCOSE-CAPILLARY: 233 mg/dL — AB (ref 65–99)
Glucose-Capillary: 178 mg/dL — ABNORMAL HIGH (ref 65–99)
Glucose-Capillary: 187 mg/dL — ABNORMAL HIGH (ref 65–99)

## 2017-04-30 LAB — HIV ANTIBODY (ROUTINE TESTING W REFLEX): HIV SCREEN 4TH GENERATION: NONREACTIVE

## 2017-04-30 LAB — HEMOGLOBIN A1C
HEMOGLOBIN A1C: 8.5 % — AB (ref 4.8–5.6)
MEAN PLASMA GLUCOSE: 197.25 mg/dL

## 2017-04-30 LAB — LIPID PANEL
Cholesterol: 225 mg/dL — ABNORMAL HIGH (ref 0–200)
HDL: 40 mg/dL — AB (ref >40)
LDL CALC: 133 mg/dL — AB (ref 0–99)
TRIGLYCERIDES: 260 mg/dL — AB (ref <150)
Total CHOL/HDL Ratio: 5.6 RATIO
VLDL: 52 mg/dL — ABNORMAL HIGH (ref 0–40)

## 2017-04-30 MED ORDER — PERFLUTREN LIPID MICROSPHERE
1.0000 mL | INTRAVENOUS | Status: AC | PRN
  Filled 2017-04-30: qty 10

## 2017-04-30 MED ORDER — MONTELUKAST SODIUM 10 MG PO TABS: 10.0000 mg | ORAL_TABLET | Freq: Every day | ORAL | Status: DC

## 2017-04-30 MED ORDER — ATORVASTATIN CALCIUM 40 MG PO TABS: 40.0000 mg | ORAL_TABLET | Freq: Every day | ORAL | 0 refills | Status: DC

## 2017-04-30 MED ORDER — ALBUTEROL SULFATE (2.5 MG/3ML) 0.083% IN NEBU: 2.5000 mg | INHALATION_SOLUTION | Freq: Four times a day (QID) | RESPIRATORY_TRACT | Status: DC | PRN

## 2017-04-30 MED ORDER — ONDANSETRON HCL 4 MG/2ML IJ SOLN: 4.0000 mg | Freq: Three times a day (TID) | INTRAMUSCULAR | Status: DC | PRN

## 2017-04-30 MED ORDER — PANTOPRAZOLE SODIUM 40 MG PO TBEC
40.0000 mg | DELAYED_RELEASE_TABLET | Freq: Every day | ORAL | Status: DC
  Administered 2017-04-30: 40 mg via ORAL
  Filled 2017-04-30: qty 1

## 2017-04-30 MED ORDER — ENOXAPARIN SODIUM 100 MG/ML ~~LOC~~ SOLN
90.0000 mg | Freq: Every day | SUBCUTANEOUS | Status: DC
  Administered 2017-04-30: 90 mg via SUBCUTANEOUS
  Filled 2017-04-30: qty 1

## 2017-04-30 MED ORDER — HYDROCODONE-HOMATROPINE 5-1.5 MG/5ML PO SYRP: 5.0000 mL | ORAL_SOLUTION | Freq: Four times a day (QID) | ORAL | Status: DC | PRN

## 2017-04-30 MED ORDER — INSULIN ASPART 100 UNIT/ML ~~LOC~~ SOLN: 0.0000 [IU] | Freq: Three times a day (TID) | SUBCUTANEOUS | Status: DC

## 2017-04-30 MED ORDER — ADULT MULTIVITAMIN W/MINERALS CH
1.0000 | ORAL_TABLET | Freq: Every day | ORAL | Status: DC
Start: 2017-04-30 — End: 2017-04-30
  Administered 2017-04-30: 1 via ORAL
  Filled 2017-04-30: qty 1

## 2017-04-30 MED ORDER — CLONAZEPAM 1 MG PO TABS: 1.0000 mg | ORAL_TABLET | Freq: Every day | ORAL | Status: DC

## 2017-04-30 MED ORDER — SENNOSIDES-DOCUSATE SODIUM 8.6-50 MG PO TABS: 1.0000 | ORAL_TABLET | Freq: Every evening | ORAL | Status: DC | PRN

## 2017-04-30 MED ORDER — STROKE: EARLY STAGES OF RECOVERY BOOK
Freq: Once | Status: AC
  Administered 2017-04-30: 01:00:00

## 2017-04-30 MED ORDER — ZOLPIDEM TARTRATE 5 MG PO TABS: 5.0000 mg | ORAL_TABLET | Freq: Every evening | ORAL | Status: DC | PRN

## 2017-04-30 MED ORDER — DICYCLOMINE HCL 20 MG PO TABS: 20.0000 mg | ORAL_TABLET | Freq: Three times a day (TID) | ORAL | Status: DC | PRN

## 2017-04-30 MED ORDER — FLUTICASONE PROPIONATE 50 MCG/ACT NA SUSP
1.0000 | Freq: Every day | NASAL | Status: DC
  Administered 2017-04-30: 1 via NASAL
  Filled 2017-04-30: qty 16

## 2017-04-30 MED ORDER — ASPIRIN 300 MG RE SUPP: 300.0000 mg | Freq: Every day | RECTAL | Status: DC

## 2017-04-30 MED ORDER — SOY PROTEIN SHAKE PO POWD: 1.0000 | Freq: Every day | ORAL | Status: DC

## 2017-04-30 MED ORDER — ATORVASTATIN CALCIUM 40 MG PO TABS
40.0000 mg | ORAL_TABLET | Freq: Every day | ORAL | Status: DC
  Administered 2017-04-30 (×2): 40 mg via ORAL
  Filled 2017-04-30 (×2): qty 1

## 2017-04-30 MED ORDER — LORATADINE 10 MG PO TABS
10.0000 mg | ORAL_TABLET | Freq: Every day | ORAL | Status: DC
  Administered 2017-04-30: 10 mg via ORAL
  Filled 2017-04-30: qty 1

## 2017-04-30 MED ORDER — SODIUM CHLORIDE 0.9 % IV SOLN
INTRAVENOUS | Status: DC
  Administered 2017-04-30: 01:00:00 via INTRAVENOUS

## 2017-04-30 MED ORDER — ASPIRIN EC 81 MG PO TBEC: 81.0000 mg | DELAYED_RELEASE_TABLET | Freq: Every day | ORAL | 0 refills | Status: AC

## 2017-04-30 MED ORDER — HYDRALAZINE HCL 20 MG/ML IJ SOLN: 5.0000 mg | INTRAMUSCULAR | Status: DC | PRN

## 2017-04-30 MED ORDER — ACETAMINOPHEN 325 MG PO TABS: 650.0000 mg | ORAL_TABLET | Freq: Four times a day (QID) | ORAL | Status: DC | PRN

## 2017-04-30 MED ORDER — ASPIRIN 325 MG PO TABS
325.0000 mg | ORAL_TABLET | Freq: Every day | ORAL | Status: DC
  Administered 2017-04-30: 325 mg via ORAL
  Filled 2017-04-30: qty 1

## 2017-04-30 MED ORDER — OLOPATADINE HCL 0.7 % OP SOLN: 1.0000 [drp] | OPHTHALMIC | Status: DC

## 2017-04-30 MED ORDER — INSULIN ASPART 100 UNIT/ML ~~LOC~~ SOLN: 0.0000 [IU] | Freq: Every day | SUBCUTANEOUS | Status: DC

## 2017-04-30 NOTE — Consult Note (Signed)
Neurology Consultation Reason for Consult: TIA Referring Physician: Mora Bellman  CC: Facial droop  History is obtained from: Patient, wife  HPI: Stephen Dunlap is a 46 y.o. male with a past medical history of hypertension, diabetes, obesity presents with transient facial droop and disconjugate gaze. A little after 6:00 PM, he states that he noticed that his face wasn't moving quite right. He felt like his lip was pulling, but his wife states that his entire left face was drooping. She also notes that his left eye was staring straight ahead, but his right eye was moving. This resolved completely, he did not notice any problems with his arms or legs.   LKW: 6:00 PM tpa given?: no, resolution of symptoms    ROS: A 14 point ROS was performed and is negative except as noted in the HPI.   Past Medical History:  Diagnosis Date  . Diabetes mellitus   . Hypertension   . Morbid obesity ([place])   . Prostatitis   . UTI (urinary tract infection)      Family History  Problem Relation Age of Onset  . Arthritis Other   . Cancer Other        breast  . Diabetes Other   . Hypertension Other   . Hyperlipidemia Other      Social History:  reports that he has never smoked. He has never used smokeless tobacco. He reports that he drinks about 1.2 oz of alcohol per week . He reports that he does not use drugs.   Exam: Current vital signs: BP 136/88 (BP Location: Left Arm)   Pulse 83   Temp 98.4 F (36.9 C) (Oral)   Resp 18   Ht 6\' 5"  (1.956 m)   Wt (!) 188.2 kg (414 lb 15.9 oz)   SpO2 95%   BMI 49.21 kg/m  Vital signs in last 24 hours: Temp:  [98.4 F (36.9 C)-99 F (37.2 C)] 98.4 F (36.9 C) (10/04 0600) Pulse Rate:  [83-101] 83 (10/04 0600) Resp:  [18-24] 18 (10/04 0600) BP: (136-163)/(79-103) 136/88 (10/04 0600) SpO2:  [94 %-97 %] 95 % (10/04 0600) Weight:  [188.2 kg (414 lb 15.9 oz)-188.2 kg (415 lb)] 188.2 kg (414 lb 15.9 oz) (10/03 2346)   Physical Exam  Constitutional:  Appears Older than stated age, morbidly obese Psych: Affect appropriate to situation Eyes: No scleral injection HENT: No OP obstrucion Head: Normocephalic.  Cardiovascular: Normal rate and regular rhythm.  Respiratory: Effort normal and breath sounds normal to anterior ascultation GI: Soft.  No distension. There is no tenderness.  Skin: WDI  Neuro: Mental Status: Patient is awake, alert, oriented to person, place, month, year, and situation. Patient is able to give a clear and coherent history. No signs of aphasia or neglect Cranial Nerves: II: Visual Fields are full. Pupils are equal, round, and reactive to light.   III,IV, VI: EOMI without ptosis or diploplia.  V: Facial sensation is symmetric to temperature VII: Facial movement is symmetric.  VIII: hearing is intact to voice X: Uvula elevates symmetrically XI: Shoulder shrug is symmetric. XII: tongue is midline without atrophy or fasciculations.  Motor: Tone is normal. Bulk is normal. 5/5 strength was present in all four extremities.  Sensory: Sensation is symmetric to light touch and temperature in the arms and legs. Cerebellar: FNF and HKS are intact bilaterally      I have reviewed labs in epic and the results pertinent to this consultation are: Elevated glucose, otherwise unremarkable CBC, CMP  I have reviewed the images obtained: CT head-negative  Impression: 46 year old male with transient facial droop and disconjugate gaze. With his risk factors, I feel the most likely etiology is TIA.  Recommendations: 1. HgbA1c, fasting lipid panel 2. MRI, MRA  of the brain without contrast 3. Frequent neuro checks 4. Echocardiogram 5. Carotid dopplers 6. Prophylactic therapy-Antiplatelet med: Aspirin - dose 325mg  PO or 300mg  PR 7. Risk factor modification 8. Telemetry monitoring 9. PT consult, OT consult, Speech consult 10. please page stroke NP  Or  PA  Or MD  from 8am -4 pm as this patient will be followed by the  stroke team at this point.   You can look them up on [email or web address]      Roland Rack, MD [facility] [phone]  If 7pm- 7am, please page neurology on call as listed in [place].

## 2017-04-30 NOTE — Progress Notes (Signed)
STROKE TEAM PROGRESS NOTE   HISTORY OF PRESENT ILLNESS (per record) Stephen Dunlap is a 46 y.o. male with a past medical history of hypertension, diabetes, obesity presents with transient facial droop and disconjugate gaze. A little after 6:00 PM, he states that he noticed that his face wasn't moving quite right. He felt like his lip was pulling, but his wife states that his entire left face was drooping. She also notes that his left eye was staring straight ahead, but his right eye was moving. This resolved completely, he did not notice any problems with his arms or legs.   LKW: 6:00 PM tpa given?: no, resolution of symptoms   SUBJECTIVE (INTERVAL HISTORY) His wife is at the bedside.  His symptoms remain improved since last night. He underwent MRI without problems. Participating with physical therapy today. He denies any prior history of strokes, TIAs or significant neurological problems. There is no family history of strokes TIAs or cardiac ischemia at a young age  OBJECTIVE Temp:  [98.4 F (36.9 C)-99 F (37.2 C)] 98.5 F (36.9 C) (10/04 0800) Pulse Rate:  [83-101] 84 (10/04 0800) Cardiac Rhythm: Normal sinus rhythm (10/03 2344) Resp:  [16-24] 16 (10/04 0800) BP: (136-163)/(79-103) 138/90 (10/04 0800) SpO2:  [94 %-97 %] 96 % (10/04 0800) Weight:  [414 lb 15.9 oz (188.2 kg)-415 lb (188.2 kg)] 414 lb 15.9 oz (188.2 kg) (10/03 2346)  CBC:  Recent Labs Lab 04/29/17 1915  WBC 9.2  NEUTROABS 7.1  HGB 14.8  HCT 43.9  MCV 83.6  PLT 295    Basic Metabolic Panel:  Recent Labs Lab 04/29/17 1915  NA 137  K 4.6  CL 104  CO2 25  GLUCOSE 239*  BUN 13  CREATININE 0.99  CALCIUM 10.0    Lipid Panel:    Component Value Date/Time   CHOL 225 (H) 04/30/2017 0711   TRIG 260 (H) 04/30/2017 0711   HDL 40 (L) 04/30/2017 0711   CHOLHDL 5.6 04/30/2017 0711   VLDL 52 (H) 04/30/2017 0711   LDLCALC 133 (H) 04/30/2017 0711   HgbA1c:  Lab Results  Component Value Date   HGBA1C 8.5  (H) 04/30/2017   Urine Drug Screen:    Component Value Date/Time   LABOPIA NONE DETECTED 04/30/2017 0351   COCAINSCRNUR NONE DETECTED 04/30/2017 0351   LABBENZ NONE DETECTED 04/30/2017 0351   AMPHETMU NONE DETECTED 04/30/2017 0351   THCU NONE DETECTED 04/30/2017 0351   LABBARB NONE DETECTED 04/30/2017 0351    Alcohol Level No results found for: [facility]  IMAGING  Dg Chest 2 View  Result Date: 04/29/2017 CLINICAL DATA:  Initial evaluation for acute chest pain. EXAM: CHEST  2 VIEW COMPARISON:  Prior radiograph from 02/26/2015. FINDINGS: Transverse heart size at the upper limits of normal, stable. Mediastinal silhouette within normal limits. Elevation left hemidiaphragm, unchanged. No airspace consolidation, pleural effusion, or pulmonary edema is identified. There is no pneumothorax. No acute osseous abnormality identified. IMPRESSION: 1. No active cardiopulmonary disease. 2. Mild elevation of the left hemidiaphragm, similar to prior, and likely chronic. Electronically Signed   By: Jeannine Boga M.D.   On: 04/29/2017 20:06   Ct Head Wo Contrast  Result Date: 04/29/2017 CLINICAL DATA:  Left-sided facial droop tonight EXAM: CT HEAD WITHOUT CONTRAST TECHNIQUE: Contiguous axial images were obtained from the base of the skull through the vertex without intravenous contrast. COMPARISON:  None. FINDINGS: Brain: No mass effect, midline shift, or acute intracranial hemorrhage. Minimal chronic ischemic changes in the periventricular white matter.  Ventricular system is within normal limits. Vascular: No hyperdense vessel or unexpected calcification. Skull: Cranium is intact. Sinuses/Orbits: Visualized paranasal sinuses are clear. Other: Noncontributory IMPRESSION: No acute intracranial pathology. Electronically Signed   By: Marybelle Killings M.D.   On: 04/29/2017 19:59      PHYSICAL EXAM Morbidly obese young african [place] male not in distress. . Afebrile. Head is nontraumatic. Neck is supple without  bruit.    Cardiac exam no murmur or gallop. Lungs are clear to auscultation. Distal pulses are well felt. Neurological Exam ;  Awake  Alert oriented x 3. Normal speech and language.eye movements full without nystagmus.fundi were not visualized. Vision acuity and fields appear normal. Hearing is normal. Palatal movements are normal. Face symmetric. Tongue midline. Normal strength, tone, reflexes and coordination. Normal sensation. Gait deferred.  ASSESSMENT/PLAN [name] is a 46 y.o. male with history of hypertension and diabetes presenting with facial droop and disconjugate gaze. He did not receive IV t-PA due to symptoms fully resolved.   TIA:  Sudden onset of right facial droop and eye deviation resolved completely. Unclear if TIA occurred due to short duration of symptoms, but has risk factors and will finish workup.  Resultant  Symptoms resolved  CT head No acute abnormality  MRI head No acute abnormality  MRA head No large vessel abnormality  Carotid Doppler  pending  2D Echo  pending  LDL 133  HgbA1c 8.5%  Enoxaparin for VTE prophylaxis  Diet heart healthy/carb modified Room service appropriate? Yes; Fluid consistency: Thin  No antithrombotic prior to admission, now on aspirin 325 mg daily  Patient counseled to be compliant with his antithrombotic medications  Ongoing aggressive stroke risk factor management  Therapy recommendations:  pending  Disposition:  Home  Hypertension  Stable  No need for permissive hypertension with possible TIA  Long-term BP goal normotensive <130/90  Hyperlipidemia  Home meds:  none  LDL 133, goal < 70  Add atovastatin 40mg   Continue statin at discharge  Diabetes  HgbA1c 8.5%, goal < 7.0  Uncontrolled  Other Stroke Risk Factors  Obesity, Body mass index is 49.21 kg/m., recommend weight loss, diet and exercise as appropriate   Plan  Expect disposition to home due to resolution of symptoms. Risk factors  include his obesity, hyperlipidemia, moderately uncontrolled diabetes and suspected sleep apnoea. Need to address BP goal <130/90 outpatient. Could go home once carotid doppler, 2D echo complete.  Hospital day # 0 I have personally examined this patient, reviewed notes, independently viewed imaging studies, participated in medical decision making and plan of care.ROS completed by me personally and pertinent positives fully documented  I have made any additions or clarifications directly to the above note. The patient's transient symptoms lasting less than a minute involving left eye and right lower face very uncharacteristic of a TIA in this single vascular distribution. Nevertheless given vascular risk factors strokes risk stratification workup seems reasonable. Start aspirin and finish ongoing workup. Long discussion with patient and wife and answered questions. Greater than 50% time during this 20 Minute visit was spent on counseling and coordination of care about stroke and TIA risk, prevention and treatment discussion and answering questions.  Antony Contras, MD Medical Director [facility] Pager: [phone] 04/30/2017 3:39 PM    To contact Stroke Continuity provider, please refer to [email or web address] After hours, contact General Neurology

## 2017-04-30 NOTE — Progress Notes (Signed)
OT Cancellation Note  Patient Details Name: Stephen Dunlap MRN: [id number] DOB: Aug 19, 1970   Cancelled Treatment:    Reason Eval/Treat Not Completed: OT screened, no needs identified, will sign off  [facility], OT/L  [phone] 04/30/2017 04/30/2017, 2:13 PM

## 2017-04-30 NOTE — Progress Notes (Signed)
  Echocardiogram 2D Echocardiogram has been performed.  Stephen Dunlap 04/30/2017, 6:12 PM

## 2017-04-30 NOTE — Progress Notes (Signed)
*  PRELIMINARY RESULTS* Vascular Ultrasound Carotid Duplex (Doppler) has been completed.  Preliminary findings: Bilateral: No significant (1-39%) ICA stenosis. Antegrade vertebral flow.    Landry Mellow, RDMS, RVT  04/30/2017, 2:59 PM

## 2017-04-30 NOTE — Evaluation (Signed)
Physical Therapy Evaluation & Discharge Patient Details Name: Stephen Dunlap MRN: [id number] DOB: [birth year] Today's Date: 04/30/2017   History of Present Illness  Pt is a 46 y/o male admitted secondary to facial droop. CT and MRI were both negative for any acute abnormalities. PMH including but not limited to DM and HTN.  Clinical Impression  Pt presented supine in bed with HOB elevated, awake and willing to participate in therapy session. Prior to admission, pt reported that he was independent with all functional mobility and ADLs. 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 Pt ambulated in hallway without use of an AD and supervision for safety with no instability or LOB. He declined stair training at this time. Pt reported that he feels he is back to his baseline with regards to functional mobility. No further acute PT needs identified at this time, PT signing off.     Follow Up Recommendations No PT follow up    Equipment Recommendations  None recommended by PT    Recommendations for Other Services       Precautions / Restrictions Precautions Precautions: None Restrictions Weight Bearing Restrictions: No      Mobility  Bed Mobility Overal bed mobility: Independent                Transfers Overall transfer level: Independent                  Ambulation/Gait Ambulation/Gait assistance: Supervision Ambulation Distance (Feet): 300 Feet Assistive device: None Gait Pattern/deviations: WFL(Within Functional Limits) Gait velocity: WFL Gait velocity interpretation: at or above normal speed for age/gender General Gait Details: no instability or LOB, supervision for safety  Stairs Stairs:  (pt declining stair training)          Wheelchair Mobility    Modified Rankin (Stroke Patients Only)       Balance Overall balance assessment: Needs assistance Sitting-balance support: Feet supported Sitting balance-Leahy Scale: Normal     Standing  balance support: During functional activity;No upper extremity supported Standing balance-Leahy Scale: Good                               Pertinent Vitals/Pain Pain Assessment: No/denies pain    Home Living Family/patient expects to be discharged to:: Private residence Living Arrangements: Spouse/significant other;Children Available Help at Discharge: Family Type of Home: House Home Access: Level entry     Home Layout: Two level Home Equipment: None      Prior Function Level of Independence: Independent         Comments: works as a Librarian, academic at a [employer]: Right    Extremity/Trunk Assessment   Upper Extremity Assessment Upper Extremity Assessment: Overall WFL for tasks assessed    Lower Extremity Assessment Lower Extremity Assessment: Overall WFL for tasks assessed    Cervical / Trunk Assessment Cervical / Trunk Assessment: Normal  Communication   Communication: No difficulties  Cognition Arousal/Alertness: Awake/alert Behavior During Therapy: WFL for tasks assessed/performed Overall Cognitive Status: Within Functional Limits for tasks assessed                                        General Comments      Exercises     Assessment/Plan    PT Assessment Patent does not  need any further PT services  PT Problem List         PT Treatment Interventions      PT Goals (Current goals can be found in the Care Plan section)  Acute Rehab PT Goals Patient Stated Goal: return home and to work tomorrow    Frequency     Barriers to discharge        Co-evaluation               AM-PAC PT "6 Clicks" Daily Activity  Outcome Measure Difficulty turning over in bed (including adjusting bedclothes, sheets and blankets)?: None Difficulty moving from lying on back to sitting on the side of the bed? : None Difficulty sitting down on and standing up from a chair with arms (e.g.,  wheelchair, bedside commode, etc,.)?: None Help needed moving to and from a bed to chair (including a wheelchair)?: None Help needed walking in hospital room?: None Help needed climbing 3-5 steps with a railing? : None 6 Click Score: 24    End of Session   Activity Tolerance: Patient tolerated treatment well Patient left: in bed;with call bell/phone within reach;with family/visitor present (sitting EOB) Nurse Communication: Mobility status PT Visit Diagnosis: Other symptoms and signs involving the nervous system (R29.898)    Time: 3614-4315 PT Time Calculation (min) (ACUTE ONLY): 18 min   Charges:   PT Evaluation $PT Eval Moderate Complexity: 1 Mod     PT G Codes:   PT G-Codes **NOT FOR INPATIENT CLASS** Functional Assessment Tool Used: AM-PAC 6 Clicks Basic Mobility;Clinical judgement Functional Limitation: Mobility: Walking and moving around Mobility: Walking and Moving Around Current Status (Q0086): 0 percent impaired, limited or restricted Mobility: Walking and Moving Around Goal Status (P6195): 0 percent impaired, limited or restricted Mobility: Walking and Moving Around Discharge Status (K9326): 0 percent impaired, limited or restricted    [facility], PT, DPT [place] 04/30/2017, 12:12 PM

## 2017-04-30 NOTE — Progress Notes (Signed)
SLP Cancellation Note  Patient Details Name: Stephen Dunlap MRN: [id number] DOB: 08/10/1970   Cancelled treatment:       Reason Eval/Treat Not Completed: SLP screened chart, no needs identified, will sign off   Juan Quam Laurice 04/30/2017, 4:55 PM

## 2017-04-30 NOTE — Progress Notes (Signed)
Pt refused insulin order, verbalized do not take insulin, said to take Trulicity every Monday, pt educated regarding his high glucose level, Pt's wife is a Marine scientist, at bedside, Dr Blaine Hamper paged and notified, no new orders at this time, will continue to monitor. [place]As[place], Maryanna Stuber Efe

## 2017-04-30 NOTE — Progress Notes (Signed)
PT Cancellation Note  Patient Details Name: Stephen Dunlap MRN: [id number] DOB: 10/20/1970   Cancelled Treatment:    Reason Eval/Treat Not Completed: Patient at procedure or test/unavailable. PT will continue to f/u with pt as available.   [place] 04/30/2017, 8:25 AM

## 2017-04-30 NOTE — Care Management Note (Signed)
Case Management Note  Patient Details  Name: Stephen Dunlap MRN: [id number] Date of Birth: November 17, 1970  Subjective/Objective:     Pt in with TIA. She is from home with spouse.               Action/Plan: No f/u per PT/OT and no DME needs. Pt has PCP, insurance and transportation home. No further needs per CM.  Expected Discharge Date:                  Expected Discharge Plan:  Home/Self Care  In-House Referral:     Discharge planning Services     Post Acute Care Choice:    Choice offered to:     DME Arranged:    DME Agency:     HH Arranged:    [place] Agency:     Status of Service:  Completed, signed off  If discussed at [employer] of Stay Meetings, dates discussed:    Additional Comments:  Pollie Friar, RN 04/30/2017, 2:42 PM

## 2017-04-30 NOTE — Discharge Summary (Signed)
Physician Discharge Summary  Stephen Dunlap KXF:[id number] DOB: [birth year] DOA: 04/29/2017  PCP: Reynold Bowen, MD  Admit date: 04/29/2017 Discharge date: 04/30/2017  Time spent: 45 minutes  Recommendations for Outpatient Follow-up:  Patient will be discharged to home.  Patient will need to follow up with primary care provider within one week of discharge.  Patient should continue medications as prescribed.  Patient should follow a heart healthy/carb modified diet.   Discharge Diagnoses:  Transient Left facial droop/Vision change, possible TIA Obstructive sleep apnea Diabetes mellitus, type II Hyperlipidemia Essential hypertension GERD  Discharge Condition: stable  Diet recommendation: heart healthy/ carb modified  Filed Weights   04/29/17 1846 04/29/17 2346  Weight: (!) 188.2 kg (415 lb) (!) 188.2 kg (414 lb 15.9 oz)    History of present illness:  On 04/29/2017 by Dr. [name] is a 46 y.o. male with medical history significant of obesity, hypertension, diabetes mellitus, GERD, anxiety, prostatitis, who presents with transient left facial droop. Pt state that he has had one episode of left facial droop at about 6:00 PM. He states that he felt like his lip was being pulled upwards but his wife, who is a nurse, noted a droop to the corner of the mouth. She also felt like the lefteye was working in a slightly different direction than the left. Symptoms lasted for approximately 45 seconds and then resolve spontaneously. Pt does not have hearing loss, unilateral weakness, numbness or tingling in extremities. Patient states that he had mild right sided chest discomfort earlier, which has resolved completely. Currently patient does not have chest pain or shortness of breath. No cough, fever, chills, nausea, vomiting, diarrhea, abdominal pain, symptoms of UTI.  Hospital Course:  Transient Left facial droop/Vision change, possible TIA -CT head no acute intracranial  pathology -MRI/MRA brain normal  -Echocardiogram pending -Carotid doppler Bilateral 1-39% ICA stenosis. Antegrade vertebral flow -LDL 133, hemoglobin 8.5 -Neurology consulted and appreciated. Discussed with Dr. Leonie Man, recommended Aspirin 81mg  and statin.  -PT evaluated, no therapy follow up needed  Obstructive sleep apnea -Supposed to use CPAP, however has not in a while -likely needs repeat sleep study   Diabetes mellitus, type II -Hemoglobin A1c 8.5 -Patient states he recently steroid injection -Continue Trulicity, jardiance, janumet  -Close follow up with PCP  Hyperlipidemia -Lipid panel : Total cholesterol 225, HDL 40, LDL 133, triglycerides 260 -Discussed diet modifications with patient and wife -Placed on statin -Repeat lipid panel in 3 months  Essential hypertension -Continue Benicar   GERD -Continue the PPI  Left lower extremity edema -resolved, occurred prior to admission -Lower ext doppler negative for DVT/SVT -possibly related to sleep apnea  Procedures: Echocardiogram  Consultations: Neurology   Discharge Exam: Vitals:   04/30/17 1200 04/30/17 1632  BP: 125/75 (!) 142/85  Pulse: 89 82  Resp:    Temp: 98 F (36.7 C) 97.9 F (36.6 C)  SpO2: 99% 95%   Patient denies numbness, tingling, chest pain, shortness of breath, abdominal pain, N/V/C/D, dizziness, headache, visual changes.    General: Well developed, well nourished, NAD, appears stated age  HEENT: NCAT, PERRLA, EOMI, Anicteic Sclera, mucous membranes moist.  Cardiovascular: S1 S2 auscultated, no rubs, murmurs or gallops. Regular rate and rhythm.  Respiratory: Clear to auscultation bilaterally with equal chest rise  Abdomen: Soft, obese, nontender, nondistended, + bowel sounds  Extremities: warm dry without cyanosis clubbing or edema  Neuro: AAOx3, nonfocal  Psych: Normal affect and demeanor with intact judgement and insight, pleasant  Discharge  Instructions Discharge Instructions     Discharge instructions    Complete by:  As directed    Patient will be discharged to home.  Patient will need to follow up with primary care provider within one week of discharge.  Patient should continue medications as prescribed.  Patient should follow a heart healthy/carb modified diet.     Current Discharge Medication List    START taking these medications   Details  aspirin EC 81 MG tablet Take 1 tablet (81 mg total) by mouth daily. Qty: 30 tablet, Refills: 0    atorvastatin (LIPITOR) 40 MG tablet Take 1 tablet (40 mg total) by mouth daily at 6 PM. Qty: 30 tablet, Refills: 0      CONTINUE these medications which have NOT CHANGED   Details  albuterol (PROVENTIL HFA;VENTOLIN HFA) 108 (90 BASE) MCG/ACT inhaler Inhale 2 puffs into the lungs every 6 (six) hours as needed for wheezing or shortness of breath. Qty: 1 Inhaler, Refills: 2    clonazePAM (KLONOPIN) 1 MG tablet Take 1 mg by mouth at bedtime as needed for anxiety (sleep).    dicyclomine (BENTYL) 20 MG tablet Take one po every 6 hours prn Qty: 20 tablet, Refills: 0    Dulaglutide (TRULICITY) 0.63 KZ/6.0FU SOPN Inject 3 mLs into the skin every 7 (seven) days. Monday of each week    empagliflozin (JARDIANCE) 10 MG TABS tablet Take 10 mg by mouth daily.    fexofenadine (ALLEGRA) 180 MG tablet Take 1 tablet (180 mg total) by mouth daily. Qty: 90 tablet, Refills: 3    FIBER ADULT GUMMIES 2 g CHEW Chew 3 each by mouth daily as needed.    ibuprofen (ADVIL,MOTRIN) 800 MG tablet Take 800 mg by mouth every 8 (eight) hours as needed for mild pain (swelling).    JANUMET 50-1000 MG tablet Take 1 tablet by mouth 2 (two) times daily with a meal.  Refills: 7    mometasone (NASONEX) 50 MCG/ACT nasal spray Place 1-2 sprays into the nose daily. Qty: 17 g, Refills: 5    montelukast (SINGULAIR) 10 MG tablet Take 1 tablet (10 mg total) by mouth at bedtime. Qty: 30 tablet, Refills: 5    Multiple Vitamin (MULTIVITAMIN) tablet Take 1  tablet by mouth daily.      olmesartan-hydrochlorothiazide (BENICAR HCT) 20-12.5 MG tablet Take 1 tablet by mouth daily as needed.    olopatadine (PATANOL) 0.1 % ophthalmic solution Place 1 drop into both eyes daily. Qty: 5 mL, Refills: 5    Olopatadine HCl (PAZEO) 0.7 % SOLN Place 1 drop into both eyes 1 day or 1 dose. Qty: 1 Bottle, Refills: 5    omeprazole (PRILOSEC) 20 MG capsule Take 1 capsule (20 mg total) by mouth daily. Qty: 90 capsule, Refills: 3    HYDROcodone-homatropine (HYCODAN) 5-1.5 MG/5ML syrup Take 5 mLs by mouth every 6 (six) hours as needed for cough. Qty: 180 mL, Refills: 0       Allergies  Allergen Reactions  . Other Anaphylaxis    Tree nuts  . Invokana [Canagliflozin]     yeast   Follow-up Information    Reynold Bowen, MD. Schedule an appointment as soon as possible for a visit in 1 week(s).   Specialty:  Endocrinology Why:  Hospital follow up Contact information: [place] [address] [phone]            The results of significant diagnostics from this hospitalization (including imaging, microbiology, ancillary and laboratory) are listed below for  reference.    Significant Diagnostic Studies: Dg Chest 2 View  Result Date: 04/29/2017 CLINICAL DATA:  Initial evaluation for acute chest pain. EXAM: CHEST  2 VIEW COMPARISON:  Prior radiograph from 02/26/2015. FINDINGS: Transverse heart size at the upper limits of normal, stable. Mediastinal silhouette within normal limits. Elevation left hemidiaphragm, unchanged. No airspace consolidation, pleural effusion, or pulmonary edema is identified. There is no pneumothorax. No acute osseous abnormality identified. IMPRESSION: 1. No active cardiopulmonary disease. 2. Mild elevation of the left hemidiaphragm, similar to prior, and likely chronic. Electronically Signed   By: Jeannine Boga M.D.   On: 04/29/2017 20:06   Ct Head Wo Contrast  Result Date: 04/29/2017 CLINICAL DATA:   Left-sided facial droop tonight EXAM: CT HEAD WITHOUT CONTRAST TECHNIQUE: Contiguous axial images were obtained from the base of the skull through the vertex without intravenous contrast. COMPARISON:  None. FINDINGS: Brain: No mass effect, midline shift, or acute intracranial hemorrhage. Minimal chronic ischemic changes in the periventricular white matter. Ventricular system is within normal limits. Vascular: No hyperdense vessel or unexpected calcification. Skull: Cranium is intact. Sinuses/Orbits: Visualized paranasal sinuses are clear. Other: Noncontributory IMPRESSION: No acute intracranial pathology. Electronically Signed   By: Marybelle Killings M.D.   On: 04/29/2017 19:59   Mr Brain Wo Contrast  Result Date: 04/30/2017 CLINICAL DATA:  Left-sided facial droop beginning yesterday, subsequently resolve. EXAM: MRI HEAD WITHOUT CONTRAST MRA HEAD WITHOUT CONTRAST TECHNIQUE: Multiplanar, multiecho pulse sequences of the brain and surrounding structures were obtained without intravenous contrast. Angiographic images of the head were obtained using MRA technique without contrast. COMPARISON:  CT 04/29/2017 FINDINGS: MRI HEAD FINDINGS Brain: The brain has normal appearance without evidence of malformation, atrophy, old or acute small or large vessel infarction, hemorrhage, hydrocephalus or extra-axial collection. No pituitary abnormality. Vascular: Major vessels at the base of the brain show flow. Skull and upper cervical spine: Normal Sinuses/Orbits: Clear/ normal. Other: None significant. MRA HEAD FINDINGS Both internal carotid arteries are widely patent into the brain. No siphon stenosis. The anterior and middle cerebral vessels are patent without proximal stenosis, aneurysm or vascular malformation. Both vertebral arteries are widely patent to the basilar. No basilar stenosis. Posterior circulation branch vessels appear normal. IMPRESSION: Normal brain MRI. Normal intracranial MR angiography. No abnormality seen to  explain the clinical presentation. Electronically Signed   By: Nelson Chimes M.D.   On: 04/30/2017 09:26   Mr Jodene Nam Head Wo Contrast  Result Date: 04/30/2017 CLINICAL DATA:  Left-sided facial droop beginning yesterday, subsequently resolve. EXAM: MRI HEAD WITHOUT CONTRAST MRA HEAD WITHOUT CONTRAST TECHNIQUE: Multiplanar, multiecho pulse sequences of the brain and surrounding structures were obtained without intravenous contrast. Angiographic images of the head were obtained using MRA technique without contrast. COMPARISON:  CT 04/29/2017 FINDINGS: MRI HEAD FINDINGS Brain: The brain has normal appearance without evidence of malformation, atrophy, old or acute small or large vessel infarction, hemorrhage, hydrocephalus or extra-axial collection. No pituitary abnormality. Vascular: Major vessels at the base of the brain show flow. Skull and upper cervical spine: Normal Sinuses/Orbits: Clear/ normal. Other: None significant. MRA HEAD FINDINGS Both internal carotid arteries are widely patent into the brain. No siphon stenosis. The anterior and middle cerebral vessels are patent without proximal stenosis, aneurysm or vascular malformation. Both vertebral arteries are widely patent to the basilar. No basilar stenosis. Posterior circulation branch vessels appear normal. IMPRESSION: Normal brain MRI. Normal intracranial MR angiography. No abnormality seen to explain the clinical presentation. Electronically Signed   By: Jan Fireman.D.  On: 04/30/2017 09:26    Microbiology: No results found for this or any previous visit (from the past 240 hour(s)).   Labs: Basic Metabolic Panel:  Recent Labs Lab 04/29/17 1915  NA 137  K 4.6  CL 104  CO2 25  GLUCOSE 239*  BUN 13  CREATININE 0.99  CALCIUM 10.0   Liver Function Tests:  Recent Labs Lab 04/29/17 1915  AST 23  ALT 34  ALKPHOS 86  BILITOT 0.4  PROT 8.0  ALBUMIN 4.4   No results for input(s): LIPASE, AMYLASE in the last 168 hours. No results  for input(s): AMMONIA in the last 168 hours. CBC:  Recent Labs Lab 04/29/17 1915  WBC 9.2  NEUTROABS 7.1  HGB 14.8  HCT 43.9  MCV 83.6  PLT 272   Cardiac Enzymes:  Recent Labs Lab 04/29/17 1915  TROPONINI <0.03   BNP: BNP (last 3 results)  Recent Labs  04/29/17 1915  BNP 23.3    ProBNP (last 3 results) No results for input(s): PROBNP in the last 8760 hours.  CBG:  Recent Labs Lab 04/29/17 1847 04/30/17 0050 04/30/17 0635 04/30/17 1116 04/30/17 1633  GLUCAP 245* 183* 178* 187* 233*       Signed:  Cristal Ford  [facility] 04/30/2017, 6:33 PM

## 2017-04-30 NOTE — Progress Notes (Signed)
VASCULAR LAB PRELIMINARY  PRELIMINARY  PRELIMINARY  PRELIMINARY  Left lower extremity venous duplex completed.    Preliminary report:  There is no DVT or SVT noted in the left lower extremity.   Tatijana Bierly, RVT 04/30/2017, 2:48 PM

## 2017-05-01 ENCOUNTER — Other Ambulatory Visit (HOSPITAL_COMMUNITY): Payer: 59

## 2017-05-01 MED FILL — ASPIRIN ADULT LOW STRENGTH: 81 | 30 days supply | Qty: 30 | Fill #0

## 2017-05-01 MED FILL — ATORVASTATIN 40 MG TABLET: 40 | 30 days supply | Qty: 30 | Fill #0

## 2017-05-03 LAB — ECHOCARDIOGRAM COMPLETE
AVLVOTPG: 3 mmHg
CHL CUP DOP CALC LVOT VTI: 18.3 cm
CHL CUP MV DEC (S): 204
E/e' ratio: 7.51
EWDT: 204 ms
FS: 37 % (ref 28–44)
Height: 77 in
IV/PV OW: 1.07
LA ID, A-P, ES: 34 mm
LA diam index: 1.11 cm/m2
LAVOLA4C: 41.7 mL
LEFT ATRIUM END SYS DIAM: 34 mm
LV PW d: 15.4 mm — AB (ref 0.6–1.1)
LV TDI E'LATERAL: 10.4
LV TDI E'MEDIAL: 7.51
LVEEAVG: 7.51
LVEEMED: 7.51
LVELAT: 10.4 cm/s
LVOT peak vel: 90.9 cm/s
MV Peak grad: 2 mmHg
MV pk E vel: 78.1 m/s
MVPKAVEL: 75.7 m/s
RV LATERAL S' VELOCITY: 15.7 cm/s
TAPSE: 26.7 mm
Weight: 6639.9 oz

## 2017-05-18 DIAGNOSIS — E1140 Type 2 diabetes mellitus with diabetic neuropathy, unspecified: Secondary | ICD-10-CM | POA: Diagnosis not present

## 2017-05-29 DIAGNOSIS — G459 Transient cerebral ischemic attack, unspecified: Secondary | ICD-10-CM | POA: Diagnosis not present

## 2017-05-29 DIAGNOSIS — I10 Essential (primary) hypertension: Secondary | ICD-10-CM | POA: Diagnosis not present

## 2017-05-29 DIAGNOSIS — E1140 Type 2 diabetes mellitus with diabetic neuropathy, unspecified: Secondary | ICD-10-CM | POA: Diagnosis not present

## 2017-06-04 MED FILL — clonazePAM 1 MG TABS: 1 | 30 days supply | Qty: 30 | Fill #0

## 2017-06-04 MED FILL — JANUMET 50-1,000 MG TABLET: 50-1000 | 30 days supply | Qty: 60 | Fill #7

## 2017-06-04 MED FILL — TRULICITY 1.5 MG/0.5 ML PEN: 1.5 | 28 days supply | Qty: 2 | Fill #7

## 2017-06-04 MED FILL — JARDIANCE 25 MG TABLET: 25 | 30 days supply | Qty: 30 | Fill #7

## 2017-06-05 MED FILL — ASPIRIN ADULT LOW STRENGTH: 81 | 90 days supply | Qty: 90 | Fill #0

## 2017-06-05 MED FILL — ATORVASTATIN 40 MG TABLET: 40 | 90 days supply | Qty: 90 | Fill #0

## 2017-06-09 ENCOUNTER — Encounter: Payer: Self-pay | Admitting: Neurology

## 2017-06-09 ENCOUNTER — Ambulatory Visit (INDEPENDENT_AMBULATORY_CARE_PROVIDER_SITE_OTHER): Payer: 59 | Admitting: Neurology

## 2017-06-09 VITALS — BP 138/90 | HR 92 | Ht 77.0 in | Wt >= 6400 oz

## 2017-06-09 DIAGNOSIS — G4726 Circadian rhythm sleep disorder, shift work type: Secondary | ICD-10-CM

## 2017-06-09 DIAGNOSIS — Z6841 Body Mass Index (BMI) 40.0 and over, adult: Secondary | ICD-10-CM

## 2017-06-09 DIAGNOSIS — R609 Edema, unspecified: Secondary | ICD-10-CM | POA: Diagnosis not present

## 2017-06-09 DIAGNOSIS — G459 Transient cerebral ischemic attack, unspecified: Secondary | ICD-10-CM | POA: Diagnosis not present

## 2017-06-09 DIAGNOSIS — G4733 Obstructive sleep apnea (adult) (pediatric): Secondary | ICD-10-CM

## 2017-06-09 DIAGNOSIS — R351 Nocturia: Secondary | ICD-10-CM | POA: Diagnosis not present

## 2017-06-09 NOTE — Progress Notes (Signed)
Subjective:    Patient ID: Stephen Dunlap is a 45 y.o. male.  HPI     Star Age, MD, PhD [facility] [address] [place],  [address]  Dear Dr. Forde Dandy,   I saw your patient, Stephen Dunlap, upon your kind request in my neurologic clinic today for initial consultation of his sleep disorder, in particular, reevaluation of his prior diagnosis of OSA. The patient is unaccompanied today. As you know, Stephen Dunlap is a 77 year old right-handed gentleman with an underlying medical history of type 2 diabetes, diabetic neuropathy, hypertension, fatty liver, reflux disease, morbid obesity with a BMI of over 45, history of depression, hyperlipidemia and recent hospital admission in October 2018 for concern for TIA with transient left facial droop and vision change, who was previously diagnosed with obstructive sleep apnea and placed on CPAP therapy. He had a home sleep test in May 2013 which showed an AHI of 38.5/hour, O2 nadir of 60%. He has not been using a CPAP machine for the past at least 10-years. He essentially stopped using his CPAP when his first wife died. He has remarried since then. He has 2 children from his first marriage, ages 64 and 32. He works at a Civil engineer, contracting. He was recently admitted on 04/29/2017 and discharged on 04/30/2017. He had a left facial droop that lasted less than a minute. He had TIA workup including CT head without contrast which showed no acute intracranial pathology, MRI and MRA brain showed normal findings, he had carotid Doppler testing which showed 1-39% ICA stenosis bilaterally, hemoglobin A1c was 8.5, LDL 133. He was advised to start a baby aspirin and take a statin. I reviewed your office note from 05/06/2017. His Epworth sleepiness score is 8 out of 24, fatigue score is 34 out of 63. He lives with his wife.  He is a nonsmoker. He drinks alcohol occasionally. His bedtime varies. When he works night shift goes to bed around  8 AM and wakes up typically before noon. He takes clonazepam for sleep. He has a normal difficult time sleeping at night. He has rotating shift. His weight has been fluctuating. He reports nocturia about once per average night and occasional morning headaches.  His Past Medical History Is Significant For: Past Medical History:  Diagnosis Date  . Diabetes mellitus   . Hypertension   . Morbid obesity ([place])   . Prostatitis   . UTI (urinary tract infection)     His Past Surgical History Is Significant For: Past Surgical History:  Procedure Laterality Date  . cysto bedside    . HERNIA REPAIR      His Family History Is Significant For: Family History  Problem Relation Age of Onset  . Arthritis Other   . Cancer Other        breast  . Diabetes Other   . Hypertension Other   . Hyperlipidemia Other     His Social History Is Significant For: Social History   Socioeconomic History  . Marital status: Married    Spouse name: None  . Number of children: 3  . Years of education: None  . Highest education level: None  Social Needs  . Financial resource strain: None  . Food insecurity - worry: None  . Food insecurity - inability: None  . Transportation needs - medical: None  . Transportation needs - non-medical: None  Occupational History  . Occupation: Estate agent  Tobacco Use  . Smoking status: Never Smoker  .  Smokeless tobacco: Never Used  Substance and Sexual Activity  . Alcohol use: Yes    Alcohol/week: 1.2 oz    Types: 2 Cans of beer per week    Comment: Socially  . Drug use: No  . Sexual activity: Yes  Other Topics Concern  . None  Social History Narrative  . None    His Allergies Are:  Allergies  Allergen Reactions  . Other Anaphylaxis    Tree nuts  . Invokana [Canagliflozin]     yeast  :   His Current Medications Are:  Outpatient Encounter Medications as of 06/09/2017  Medication Sig  . albuterol (PROVENTIL HFA;VENTOLIN HFA) 108 (90  BASE) MCG/ACT inhaler Inhale 2 puffs into the lungs every 6 (six) hours as needed for wheezing or shortness of breath.  Marland Kitchen aspirin EC 81 MG tablet Take 1 tablet (81 mg total) by mouth daily.  Marland Kitchen atorvastatin (LIPITOR) 40 MG tablet Take 1 tablet (40 mg total) by mouth daily at 6 PM.  . clonazePAM (KLONOPIN) 1 MG tablet Take 1 mg by mouth at bedtime as needed for anxiety (sleep).  Marland Kitchen dicyclomine (BENTYL) 20 MG tablet Take one po every 6 hours prn  . Dulaglutide (TRULICITY) 3.66 YQ/0.3KV SOPN Inject 3 mLs into the skin every 7 (seven) days. Monday of each week  . DULoxetine (CYMBALTA) 20 MG capsule Take 20 mg daily by mouth.  . empagliflozin (JARDIANCE) 10 MG TABS tablet Take 10 mg by mouth daily.  . fexofenadine (ALLEGRA) 180 MG tablet Take 1 tablet (180 mg total) by mouth daily.  Marland Kitchen FIBER ADULT GUMMIES 2 g CHEW Chew 3 each by mouth daily as needed.  Marland Kitchen HYDROcodone-homatropine (HYCODAN) 5-1.5 MG/5ML syrup Take 5 mLs by mouth every 6 (six) hours as needed for cough.  Marland Kitchen ibuprofen (ADVIL,MOTRIN) 800 MG tablet Take 800 mg by mouth every 8 (eight) hours as needed for mild pain (swelling).  Marland Kitchen JANUMET 50-1000 MG tablet Take 1 tablet by mouth 2 (two) times daily with a meal.   . mometasone (NASONEX) 50 MCG/ACT nasal spray Place 1-2 sprays into the nose daily.  . montelukast (SINGULAIR) 10 MG tablet Take 1 tablet (10 mg total) by mouth at bedtime.  . Multiple Vitamin (MULTIVITAMIN) tablet Take 1 tablet by mouth daily.    Marland Kitchen olmesartan-hydrochlorothiazide (BENICAR HCT) 20-12.5 MG tablet Take 1 tablet by mouth daily as needed.  Marland Kitchen olopatadine (PATANOL) 0.1 % ophthalmic solution Place 1 drop into both eyes daily.  . Olopatadine HCl (PAZEO) 0.7 % SOLN Place 1 drop into both eyes 1 day or 1 dose. (Patient taking differently: Place 1 drop into both eyes daily as needed. )  . omeprazole (PRILOSEC) 20 MG capsule Take 1 capsule (20 mg total) by mouth daily.   No facility-administered encounter medications on file as of  06/09/2017.   : Review of Systems:  Out of a complete 14 point review of systems, all are reviewed and negative with the exception of these symptoms as listed below:  Review of Systems  Neurological:       Pt presents today to discuss his sleep. Pt has had a sleep study in the past and has been put on cpap. Pt does not currently use a cpap but has an older one at home. Pt does endorse snoring.  Epworth Sleepiness Scale 0= would never doze 1= slight chance of dozing 2= moderate chance of dozing 3= high chance of dozing  Sitting and reading: 2 Watching TV: 2 Sitting inactive in a public  place (ex. Theater or meeting): 0 As a passenger in a car for an hour without a break: 1 Lying down to rest in the afternoon: 1 Sitting and talking to someone: 0 Sitting quietly after lunch (no alcohol): 2 In a car, while stopped in traffic: 0 Total: 8     Objective:  Neurological Exam  Physical Exam Physical Examination:   Vitals:   06/09/17 1109  BP: 138/90  Pulse: 92    General Examination: The patient is a very pleasant 47 y.o. male in no acute distress. He appears well-developed and well-nourished and well groomed.   HEENT: Normocephalic, atraumatic, pupils are equal, round and reactive to light and accommodation. Extraocular tracking is good without limitation to gaze excursion or nystagmus noted. Normal smooth pursuit is noted. Hearing is grossly intact. Tympanic membranes are clear bilaterally. Face is symmetric with normal facial animation and normal facial sensation. Speech is clear with no dysarthria noted. There is no hypophonia. There is no lip, neck/head, jaw or voice tremor. Neck is supple with full range of passive and active motion. There are no carotid bruits on auscultation. Oropharynx exam reveals: mild mouth dryness, adequate dental hygiene and moderate airway crowding, due to larger tongue, large uvula, tonsils in place, about 1+ bilaterally. Mallampati is class II. Tongue  protrudes centrally and palate elevates symmetrically. Neck size is 20.5 inches. He has a Mild overbite.   Chest: Clear to auscultation without wheezing, rhonchi or crackles noted.  Heart: S1+S2+0, regular and normal without murmurs, rubs or gallops noted.   Abdomen: Soft, non-tender and non-distended with normal bowel sounds appreciated on auscultation.  Extremities: There is trace pitting edema in the distal lower extremities bilaterally. Pedal pulses are intact.  Skin: Warm and dry without trophic changes noted.  Musculoskeletal: exam reveals no obvious joint deformities, tenderness or joint swelling or erythema.   Neurologically:  Mental status: The patient is awake, alert and oriented in all 4 spheres. His immediate and remote memory, attention, language skills and fund of knowledge are appropriate. There is no evidence of aphasia, agnosia, apraxia or anomia. Speech is clear with normal prosody and enunciation. Thought process is linear. Mood is normal and affect is normal.  Cranial nerves II - XII are as described above under HEENT exam. In addition: shoulder shrug is normal with equal shoulder height noted. Motor exam: Normal bulk, strength and tone is noted. There is no drift, tremor or rebound. Romberg is negative. Reflexes are 1+ in the UEs, trace in the LEs. Fine motor skills and coordination: intact with normal finger taps, normal hand movements, normal rapid alternating patting, normal foot taps and normal foot agility.  Cerebellar testing: No dysmetria or intention tremor on finger to nose testing. Heel to shin is unremarkable bilaterally. There is no truncal or gait ataxia.  Sensory exam: intact to light touch in the upper and lower extremities.  Gait, station and balance: He stands easily. No veering to one side is noted. No leaning to one side is noted. Posture is age-appropriate and stance is narrow based. Gait shows normal stride length and normal pace. No problems turning are  noted. Tandem walk is unremarkable.   Assessment and Plan:  In summary, Stephen Dunlap is a very pleasant 11 y.o.-year old male with an underlying medical history of type 2 diabetes, diabetic neuropathy, hypertension, fatty liver, reflux disease, morbid obesity with a BMI of over 45, history of depression, hyperlipidemia and recent hospital admission in October 2018 for concern for TIA with  transient left facial droop and vision change, who carries a prior diagnosis of OSA. His history and exam are also concerning for underlying OSA. He needs reevaluation.  I had a long chat with the patient about my findings and the diagnosis of OSA, its prognosis and treatment options. We talked about medical treatments, surgical interventions and non-pharmacological approaches. I explained in particular the risks and ramifications of untreated moderate to severe OSA, especially with respect to developing cardiovascular disease down the Road, including congestive heart failure, difficult to treat hypertension, cardiac arrhythmias, or stroke. Even type 2 diabetes has, in part, been linked to untreated OSA. Symptoms of untreated OSA include daytime sleepiness, memory problems, mood irritability and mood disorder such as depression and anxiety, lack of energy, as well as recurrent headaches, especially morning headaches. We talked about trying to maintain a healthy lifestyle in general, as well as the importance of weight control. I encouraged the patient to eat healthy, exercise daily and keep well hydrated, to keep a scheduled bedtime and wake time routine, to not skip any meals and eat healthy snacks in between meals. I advised the patient not to drive when feeling sleepy. I recommended the following at this time: sleep study with potential positive airway pressure titration. (We will score hypopneas at 4%).   I explained the sleep test procedure to the patient and also outlined possible surgical and non-surgical treatment  options of OSA, including the use of a custom-made dental device (which would require a referral to a specialist dentist or oral surgeon), upper airway surgical options, such as pillar implants, radiofrequency surgery, tongue base surgery, and UPPP (which would involve a referral to an ENT surgeon). Rarely, jaw surgery such as mandibular advancement may be considered.  I also explained the CPAP treatment option to the patient, who indicated that he would be willing to try CPAP if the need arises. I explained the importance of being compliant with PAP treatment, not only for insurance purposes but primarily to improve His symptoms, and for the patient's long term health benefit, including to reduce His cardiovascular risks. He reports that when he was using CPAP he felt better. I answered all his questions today and the patient was in agreement. I would like to see him back after the sleep study is completed and encouraged him to call with any interim questions, concerns, problems or updates.   Thank you very much for allowing me to participate in the care of this nice patient. If I can be of any further assistance to you please do not hesitate to call me at [phone].  Sincerely,   Star Age, MD, PhD

## 2017-06-09 NOTE — Patient Instructions (Signed)

## 2017-06-11 ENCOUNTER — Telehealth: Payer: Self-pay | Admitting: Neurology

## 2017-06-11 NOTE — Telephone Encounter (Signed)
While pt was on hold while previous message was being sent to NP Jinny Blossom he said Sheena from Sleep [facility] called and his appointment has been changed for him to the 07-31-2017.  NP Megan or RN [facility] no longer needs to call pt

## 2017-06-11 NOTE — Telephone Encounter (Signed)
Pt states that NP Jinny Blossom offered him 07-31-2017 as an option for his sleep study appointment as well.  Pt states that even though he left a voicemail on sleep study voicemail he also wants this message sent to NP [facility] requesting to be moved to 07-31-2017.  Pt is asking for a call back to confirm if he can be locked into this date of 07-31-2017

## 2017-06-16 ENCOUNTER — Telehealth: Payer: 59 | Admitting: Nurse Practitioner

## 2017-06-16 DIAGNOSIS — B372 Candidiasis of skin and nail: Secondary | ICD-10-CM

## 2017-06-16 MED ORDER — NYSTATIN 100000 UNIT/GM EX CREA: 1.0000 "application " | TOPICAL_CREAM | Freq: Two times a day (BID) | CUTANEOUS | 0 refills | Status: DC

## 2017-06-16 NOTE — Progress Notes (Signed)
E Visit for Rash  We are sorry that you are not feeling well. Here is how we plan to help!    Based upon your presentation it appears you have a fungal infection.  I have prescribed: and Nystatin cream apply to the affected area twice daily   HOME CARE:   Take cool showers and avoid direct sunlight.  Apply cool compress or wet dressings.  Take a bath in an oatmeal bath.  Sprinkle content of one Aveeno packet under running faucet with comfortably warm water.  Bathe for 15-20 minutes, 1-2 times daily.  Pat dry with a towel. Do not rub the rash.  Use hydrocortisone cream.  Take an antihistamine like Benadryl for widespread rashes that itch.  The adult dose of Benadryl is 25-50 mg by mouth 4 times daily.  Caution:  This type of medication may cause sleepiness.  Do not drink alcohol, drive, or operate dangerous machinery while taking antihistamines.  Do not take these medications if you have prostate enlargement.  Read package instructions thoroughly on all medications that you take.  GET HELP RIGHT AWAY IF:   Symptoms don't go away after treatment.  Severe itching that persists.  If you rash spreads or swells.  If you rash begins to smell.  If it blisters and opens or develops a yellow-brown crust.  You develop a fever.  You have a sore throat.  You become short of breath.  MAKE SURE YOU:  Understand these instructions. Will watch your condition. Will get help right away if you are not doing well or get worse.  Thank you for choosing an e-visit. Your e-visit answers were reviewed by a board certified advanced clinical practitioner to complete your personal care plan. Depending upon the condition, your plan could have included both over the counter or prescription medications. Please review your pharmacy choice. Be sure that the pharmacy you have chosen is open so that you can pick up your prescription now.  If there is a problem you may message your provider in MyChart  to have the prescription routed to another pharmacy. Your safety is important to us. If you have drug allergies check your prescription carefully.  For the next 24 hours, you can use MyChart to ask questions about today's visit, request a non-urgent call back, or ask for a work or school excuse from your e-visit provider. You will get an email in the next two days asking about your experience. I hope that your e-visit has been valuable and will speed your recovery.      

## 2017-06-16 NOTE — Progress Notes (Signed)
duplicate encounter

## 2017-06-17 MED FILL — NYSTATIN 100,000 UNIT/GM CR: 100000 | 20 days supply | Qty: 30 | Fill #0

## 2017-06-22 ENCOUNTER — Telehealth: Payer: Self-pay

## 2017-06-22 DIAGNOSIS — G4726 Circadian rhythm sleep disorder, shift work type: Secondary | ICD-10-CM

## 2017-06-22 NOTE — Telephone Encounter (Signed)
HST order placed. 

## 2017-06-22 NOTE — Telephone Encounter (Signed)
Sorry but [facility] denied sleep study. This is one I had to resubmit. He will need a HST order. Thanks

## 2017-07-22 ENCOUNTER — Ambulatory Visit (INDEPENDENT_AMBULATORY_CARE_PROVIDER_SITE_OTHER): Payer: 59 | Admitting: Neurology

## 2017-07-22 DIAGNOSIS — G4733 Obstructive sleep apnea (adult) (pediatric): Secondary | ICD-10-CM | POA: Diagnosis not present

## 2017-07-22 DIAGNOSIS — G4726 Circadian rhythm sleep disorder, shift work type: Secondary | ICD-10-CM

## 2017-07-31 ENCOUNTER — Other Ambulatory Visit: Payer: Self-pay | Admitting: Neurology

## 2017-07-31 DIAGNOSIS — G4733 Obstructive sleep apnea (adult) (pediatric): Secondary | ICD-10-CM

## 2017-07-31 NOTE — Procedures (Signed)
  [facility] [place], [place] [address] NAME:   Stephen Dunlap                                                         DOB: [birth year] MEDICAL RECORD NUMBER [id number]                                      DOS: 07/22/17 REFERRING PHYSICIAN: Reynold Bowen, MD  STUDY PERFORMED: HST Salvatore Marvel) HISTORY: 47 year old man with a history of type 2 diabetes, diabetic neuropathy, hypertension, fatty liver, reflux disease, morbid obesity, depression, hyperlipidemia and recent hospital admission in October 2018 for concern for TIA, who was previously diagnosed with obstructive sleep apnea and placed on CPAP therapy, he has not been using a CPAP machine for at least 10 years. BMI of 48.2.   STUDY RESULTS: Total Recording Time: 7 hours, 20 minutes Total Apnea/Hypopnea Index (AHI):  43.2/hr Average Oxygen Saturation: 93% Lowest Oxygen Desaturation: 66%  Total Time Oxygen Saturation Below or at 89%: 36.8 min (8.3%)  Average Heart Rate: 86 bpm  IMPRESSION: OSA RECOMMENDATION: This home sleep test demonstrates severe obstructive sleep apnea with a total AHI of 43.2/hour and O2 nadir of 66% as well as significant time below 89% saturation of 36.8 minutes. Given the patient's medical history and sleep related complaints, treatment with positive airway pressure (in the form of CPAP) is recommended. This will require a full night CPAP titration study for proper treatment settings, proper O2 monitoring and mask fitting. Please note that untreated obstructive sleep apnea carries additional perioperative morbidity. Patients with significant obstructive sleep apnea should receive perioperative PAP therapy and the surgeons and particularly the anesthesiologist should be informed of the diagnosis and the severity of the sleep disordered breathing. The patient should be cautioned not to drive, work at heights, or operate dangerous or heavy equipment when tired or sleepy. Review and  reiteration of good sleep hygiene measures should be pursued with any patient. Please note, that a home sleep test does not rule out any other underlying sleep disorder. The patient and his referring provider will be notified of the test results. The patient will be seen in follow up in sleep clinic at [facility]. I certify that I have reviewed the raw data recording prior to the issuance of this report in accordance with the standards of the American Academy of Sleep Medicine (AASM). Star Age, MD, PhD [facility]) Diplomat, ABPN (neurology and sleep)

## 2017-07-31 NOTE — Progress Notes (Signed)
Patient referred by Dr. Forde Dandy, seen by me on 06/09/17, HST on 07/22/17:  Please call and notify the patient that the recent home sleep test did suggest the diagnosis of moderate obstructive sleep apnea and that I recommend treatment for this in the form of CPAP. I will request an overnight sleep study for proper titration and mask fitting. Please explain to patient and arrange for a CPAP titration study. I have placed an order in the chart. Thanks,   Star Age, MD, PhD [facility])

## 2017-08-03 ENCOUNTER — Telehealth: Payer: Self-pay

## 2017-08-03 NOTE — Telephone Encounter (Signed)
-----   Message from Star Age, MD sent at 07/31/2017 12:46 PM EST ----- Patient referred by Dr. Forde Dandy, seen by me on 06/09/17, HST on 07/22/17:  Please call and notify the patient that the recent home sleep test did suggest the diagnosis of moderate obstructive sleep apnea and that I recommend treatment for this in the form of CPAP. I will request an overnight sleep study for proper titration and mask fitting. Please explain to patient and arrange for a CPAP titration study. I have placed an order in the chart. Thanks,   Star Age, MD, PhD [facility])

## 2017-08-03 NOTE — Telephone Encounter (Signed)
I called Stephen Dunlap. I advised Stephen Dunlap that Dr. Rexene Alberts reviewed their sleep study results and found that Stephen Dunlap has moderate osa and recommends that Stephen Dunlap be treated with a cpap. Dr. Rexene Alberts recommends that Stephen Dunlap return for a repeat sleep study in order to properly titrate the cpap and ensure a good mask fit. Stephen Dunlap is agreeable to returning for a titration study. I advised Stephen Dunlap that our sleep lab will file with Stephen Dunlap's insurance and call Stephen Dunlap to schedule the sleep study when we hear back from the Stephen Dunlap's insurance regarding coverage of this sleep study. Stephen Dunlap verbalized understanding of results. Stephen Dunlap had no questions at this time but was encouraged to call back if questions arise.

## 2017-08-04 ENCOUNTER — Telehealth: Payer: Self-pay

## 2017-08-04 DIAGNOSIS — G4733 Obstructive sleep apnea (adult) (pediatric): Secondary | ICD-10-CM

## 2017-08-04 NOTE — Telephone Encounter (Signed)
We will set patient up with autoPAP at home, as insurance denied in house titration study for OSA despite severe obstructive sleep apnea. Pls process order and notify patient and set up FU in 10 weeks with me or NP.

## 2017-08-04 NOTE — Telephone Encounter (Signed)
[employer] will deny in-lab study, can we get an order for an Auto-titration

## 2017-08-05 NOTE — Telephone Encounter (Signed)
I called pt, explained that his insurance will deny an in lab titration study for osa. Pt says that he does not think this is the case since he has 2 insurance coverages, [employer] and [employer] Pt is asking for our sleep lab to call him to discuss attempting a titration study.

## 2017-08-13 ENCOUNTER — Telehealth: Payer: Self-pay

## 2017-08-13 NOTE — Telephone Encounter (Signed)
Spoke with patient and explained the reason insurance denied in sleep study. He understood and would wait on his Auto unit. Order will be sent.

## 2017-08-13 NOTE — Telephone Encounter (Signed)
I have the auto pap order, will send to [facility], and I will call pt on Monday to discuss his f/u appt.

## 2017-08-14 MED FILL — TRULICITY 1.5 MG/0.5 ML PEN: 1.5 | 28 days supply | Qty: 2 | Fill #0

## 2017-08-17 NOTE — Telephone Encounter (Signed)
Pt returned my call. I advised pt that since his cpap titration study was denied, Dr. Rexene Alberts recommends that pt start an auto pap.  I reviewed PAP compliance expectations with the pt. Pt is agreeable to starting an auto-PAP. I advised pt that an order will be sent to a DME, [facility], and [facility] will call the pt within about one week after they file with the pt's insurance. [facility] will show the pt how to use the machine, fit for masks, and troubleshoot the auto-PAP if needed. A follow up appt was made for insurance purposes with Dr. Rexene Alberts on 11/12/17 at 9:30am. Pt verbalized understanding to arrive 15 minutes early and bring their auto-PAP. A letter with all of this information in it will be mailed to the pt as a reminder. I verified with the pt that the address we have on file is correct. Pt verbalized understanding of results. Pt had no questions at this time but was encouraged to call back if questions arise.

## 2017-08-17 NOTE — Telephone Encounter (Signed)
I called pt to discuss. No answer, VM full, will try again later.

## 2017-08-21 MED FILL — clonazePAM 1 MG TABS: 1 | 30 days supply | Qty: 30 | Fill #1

## 2017-08-21 MED FILL — JARDIANCE 25 MG TABLET: 25 | 30 days supply | Qty: 30 | Fill #0

## 2017-09-14 MED FILL — TRULICITY 1.5 MG/0.5 ML PEN: 1.5 | 28 days supply | Qty: 2 | Fill #1

## 2017-09-14 MED FILL — OLOPATADINE HCL 0.1% EYE DR: 0.1 | 30 days supply | Qty: 5 | Fill #2

## 2017-09-14 MED FILL — ATORVASTATIN 40 MG TABLET: 40 | 90 days supply | Qty: 90 | Fill #1

## 2017-09-14 MED FILL — ASPIRIN ADULT LOW STRENGTH: 81 | 90 days supply | Qty: 90 | Fill #1

## 2017-09-14 MED FILL — MOMETASONE FUROATE 50 MCG S: 50 | 30 days supply | Qty: 17 | Fill #3

## 2017-09-14 MED FILL — MONTELUKAST SOD 10 MG TAB: 10 | 30 days supply | Qty: 30 | Fill #2

## 2017-09-14 MED FILL — JARDIANCE 25 MG TABLET: 25 | 30 days supply | Qty: 30 | Fill #1

## 2017-09-16 DIAGNOSIS — G4733 Obstructive sleep apnea (adult) (pediatric): Secondary | ICD-10-CM | POA: Diagnosis not present

## 2017-09-16 DIAGNOSIS — E1149 Type 2 diabetes mellitus with other diabetic neurological complication: Secondary | ICD-10-CM | POA: Diagnosis not present

## 2017-09-16 DIAGNOSIS — Z1389 Encounter for screening for other disorder: Secondary | ICD-10-CM | POA: Diagnosis not present

## 2017-09-16 DIAGNOSIS — E7849 Other hyperlipidemia: Secondary | ICD-10-CM | POA: Diagnosis not present

## 2017-09-16 DIAGNOSIS — I779 Disorder of arteries and arterioles, unspecified: Secondary | ICD-10-CM | POA: Diagnosis not present

## 2017-09-16 DIAGNOSIS — E1142 Type 2 diabetes mellitus with diabetic polyneuropathy: Secondary | ICD-10-CM | POA: Diagnosis not present

## 2017-09-16 DIAGNOSIS — I10 Essential (primary) hypertension: Secondary | ICD-10-CM | POA: Diagnosis not present

## 2017-09-16 DIAGNOSIS — K219 Gastro-esophageal reflux disease without esophagitis: Secondary | ICD-10-CM | POA: Diagnosis not present

## 2017-09-25 DIAGNOSIS — H10413 Chronic giant papillary conjunctivitis, bilateral: Secondary | ICD-10-CM | POA: Diagnosis not present

## 2017-09-25 DIAGNOSIS — E113391 Type 2 diabetes mellitus with moderate nonproliferative diabetic retinopathy without macular edema, right eye: Secondary | ICD-10-CM | POA: Diagnosis not present

## 2017-09-25 DIAGNOSIS — H524 Presbyopia: Secondary | ICD-10-CM | POA: Diagnosis not present

## 2017-09-25 DIAGNOSIS — E113292 Type 2 diabetes mellitus with mild nonproliferative diabetic retinopathy without macular edema, left eye: Secondary | ICD-10-CM | POA: Diagnosis not present

## 2017-09-25 DIAGNOSIS — H2513 Age-related nuclear cataract, bilateral: Secondary | ICD-10-CM | POA: Diagnosis not present

## 2017-10-29 MED FILL — JARDIANCE 25 MG TABLET: 25 | 30 days supply | Qty: 30 | Fill #2

## 2017-10-29 MED FILL — MONTELUKAST SOD 10 MG TAB: 10 | 30 days supply | Qty: 30 | Fill #3

## 2017-10-29 MED FILL — MOMETASONE FUROATE 50 MCG S: 50 | 30 days supply | Qty: 17 | Fill #4

## 2017-10-29 MED FILL — TRULICITY 1.5 MG/0.5 ML PEN: 1.5 | 28 days supply | Qty: 2 | Fill #2

## 2017-10-29 MED FILL — clonazePAM 1 MG TABS: 1 | 30 days supply | Qty: 30 | Fill #2

## 2017-10-29 MED FILL — JANUMET 50-1,000 MG TABLET: 50-1000 | 30 days supply | Qty: 60 | Fill #0

## 2017-10-29 MED FILL — OLOPATADINE HCL 0.1% EYE DR: 0.1 | 30 days supply | Qty: 5 | Fill #3

## 2017-11-12 ENCOUNTER — Ambulatory Visit: Payer: Self-pay | Admitting: Neurology

## 2017-12-17 MED FILL — TRULICITY 1.5 MG/0.5 ML PEN: 1.5 | 28 days supply | Qty: 2 | Fill #0

## 2017-12-18 MED FILL — ACCU-CHEK GUIDE TEST STRIP: 50 days supply | Qty: 100 | Fill #0

## 2017-12-24 NOTE — Telephone Encounter (Signed)
Received this notice from [facility]: "This patient did not ever get set up with [place]. Nira Conn spoke with the patient about financials on 08/24/2017 and called to follow up on 08/28/2017, no answer LVM. I called on 09/24/2017, 09/25/2017 no answer LVM. Sales order was voided."  I called pt. He says that [facility] told him he would owe $1200 for his auto pap. Pt does not wish to pay that. He wants to keep his appt to discuss his sleep study results and tx options. I reminded pt of his appt date and time. Pt verbalized understanding.

## 2017-12-25 ENCOUNTER — Other Ambulatory Visit: Payer: Self-pay | Admitting: Allergy and Immunology

## 2017-12-28 ENCOUNTER — Ambulatory Visit (INDEPENDENT_AMBULATORY_CARE_PROVIDER_SITE_OTHER): Payer: 59 | Admitting: Neurology

## 2017-12-28 ENCOUNTER — Encounter: Payer: Self-pay | Admitting: Neurology

## 2017-12-28 VITALS — BP 157/102 | HR 92 | Ht 77.0 in | Wt >= 6400 oz

## 2017-12-28 DIAGNOSIS — G4733 Obstructive sleep apnea (adult) (pediatric): Secondary | ICD-10-CM

## 2017-12-28 NOTE — Progress Notes (Addendum)
Subjective:    Patient ID: Stephen Dunlap is a 47 y.o. male.  HPI     Interim history:   Stephen Dunlap is a 37 year old right-handed gentleman with an underlying medical history of type 2 diabetes, diabetic neuropathy, hypertension, fatty liver, reflux disease, morbid obesity with a BMI of over 45, history of depression, hyperlipidemia and recent hospital admission in October 2018 for concern for TIA with transient left facial droop and vision change, who presents for follow-up consultation of his obstructive sleep apnea. The patient is unaccompanied today. I first met him on 06/09/2017 at the request of his primary care physician, at which time the patient reported a prior diagnosis of obstructive sleep apnea. He was no longer using his CPAP machine. I advised him to proceed with a sleep study testing. His insurance denied a lab attended sleep study. He had a home sleep test on 07/22/2017 which showed severe sleep apnea with an overall AHI of 43.2 per hour, average oxygen saturation of 93%, nadir of 66% with significant time below 88% or at 88% saturation of 36.8 minutes for the night, total testing time was 7 hours and 20 minutes. He was advised to proceed with positive airway pressure treatment. He did not proceed with treatment secondary to cost.  Today, 12/28/2017: He reports that he has two insurances and does not see how his machine is not covered. He would like to consider coming in for a sleep study. He would like to submit DME order to [facility]. He is advised that his home sleep test showed severe sleep apnea and that his insurance may not cover him for a lab attended sleep study, nevertheless I would be happy to place another order for a sleep study in lab and we will resubmit his DME order to [facility]. He reports recent neck pain radiating to the right shoulder. He has an appointment with orthopedics coming up.   The patient's allergies, current medications, family history,  past medical history, past social history, past surgical history and problem list were reviewed and updated as appropriate.   Previously (copied from previous notes for reference):   06/09/2017: (He) was previously diagnosed with obstructive sleep apnea and placed on CPAP therapy. He had a home sleep test in May 2013 which showed an AHI of 38.5/hour, O2 nadir of 60%. He has not been using a CPAP machine for the past at least 10-years. He essentially stopped using his CPAP when his first wife died. He has remarried since then. He has 2 children from his first marriage, ages 43 and 75. He works at a [employer], contracting. He was recently admitted on 04/29/2017 and discharged on 04/30/2017. He had a left facial droop that lasted less than a minute. He had TIA workup including CT head without contrast which showed no acute intracranial pathology, MRI and MRA brain showed normal findings, he had carotid Doppler testing which showed 1-39% ICA stenosis bilaterally, hemoglobin A1c was 8.5, LDL 133. He was advised to start a baby aspirin and take a statin. I reviewed your office note from 05/06/2017. His Epworth sleepiness score is 8 out of 24, fatigue score is 34 out of 63. He lives with his wife.  He is a nonsmoker. He drinks alcohol occasionally. His bedtime varies. When he works night shift goes to bed around 8 AM and wakes up typically before noon. He takes clonazepam for sleep. He has a normal difficult time sleeping at night. He has rotating shift. His weight has been  fluctuating. He reports nocturia about once per average night and occasional morning headaches.  His Past Medical History Is Significant For: Past Medical History:  Diagnosis Date  . Diabetes mellitus   . Hypertension   . Morbid obesity ([place])   . Prostatitis   . UTI (urinary tract infection)     His Past Surgical History Is Significant For: Past Surgical History:  Procedure Laterality Date  . cysto bedside    . HERNIA REPAIR      His  Family History Is Significant For: Family History  Problem Relation Age of Onset  . Arthritis Other   . Cancer Other        breast  . Diabetes Other   . Hypertension Other   . Hyperlipidemia Other     His Social History Is Significant For: Social History   Socioeconomic History  . Marital status: Married    Spouse name: Not on file  . Number of children: 3  . Years of education: Not on file  . Highest education level: Not on file  Occupational History  . Occupation: Estate agent  Social Needs  . Financial resource strain: Not on file  . Food insecurity:    Worry: Not on file    Inability: Not on file  . Transportation needs:    Medical: Not on file    Non-medical: Not on file  Tobacco Use  . Smoking status: Never Smoker  . Smokeless tobacco: Never Used  Substance and Sexual Activity  . Alcohol use: Yes    Alcohol/week: 1.2 oz    Types: 2 Cans of beer per week    Comment: Socially  . Drug use: No  . Sexual activity: Yes  Lifestyle  . Physical activity:    Days per week: Not on file    Minutes per session: Not on file  . Stress: Not on file  Relationships  . Social connections:    Talks on phone: Not on file    Gets together: Not on file    Attends religious service: Not on file    Active member of club or organization: Not on file    Attends meetings of clubs or organizations: Not on file    Relationship status: Not on file  Other Topics Concern  . Not on file  Social History Narrative  . Not on file    His Allergies Are:  Allergies  Allergen Reactions  . Other Anaphylaxis    Tree nuts  . Invokana [Canagliflozin]     yeast  :   His Current Medications Are:  Outpatient Encounter Medications as of 12/28/2017  Medication Sig  . albuterol (PROVENTIL HFA;VENTOLIN HFA) 108 (90 BASE) MCG/ACT inhaler Inhale 2 puffs into the lungs every 6 (six) hours as needed for wheezing or shortness of breath.  Marland Kitchen aspirin EC 81 MG tablet Take 1 tablet (81 mg  total) by mouth daily.  Marland Kitchen atorvastatin (LIPITOR) 40 MG tablet Take 1 tablet (40 mg total) by mouth daily at 6 PM.  . clonazePAM (KLONOPIN) 1 MG tablet Take 1 mg by mouth at bedtime as needed for anxiety (sleep).  Marland Kitchen dicyclomine (BENTYL) 20 MG tablet Take one po every 6 hours prn  . Dulaglutide (TRULICITY) 2.95 MW/4.1LK SOPN Inject 3 mLs into the skin every 7 (seven) days. Monday of each week  . DULoxetine (CYMBALTA) 20 MG capsule Take 20 mg daily by mouth.  . empagliflozin (JARDIANCE) 10 MG TABS tablet Take 10 mg by mouth daily.  Marland Kitchen  fexofenadine (ALLEGRA) 180 MG tablet Take 1 tablet (180 mg total) by mouth daily.  Marland Kitchen FIBER ADULT GUMMIES 2 g CHEW Chew 3 each by mouth daily as needed.  Marland Kitchen HYDROcodone-homatropine (HYCODAN) 5-1.5 MG/5ML syrup Take 5 mLs by mouth every 6 (six) hours as needed for cough.  Marland Kitchen ibuprofen (ADVIL,MOTRIN) 800 MG tablet Take 800 mg by mouth every 8 (eight) hours as needed for mild pain (swelling).  Marland Kitchen JANUMET 50-1000 MG tablet Take 1 tablet by mouth 2 (two) times daily with a meal.   . mometasone (NASONEX) 50 MCG/ACT nasal spray PLACE 1-2 SPRAYS INTO THE NOSE DAILY.  . montelukast (SINGULAIR) 10 MG tablet TAKE 1 TABLET BY MOUTH EACH NIGHT AT BEDTIME  . Multiple Vitamin (MULTIVITAMIN) tablet Take 1 tablet by mouth daily.    Marland Kitchen nystatin cream (MYCOSTATIN) Apply 1 application topically 2 (two) times daily.  Marland Kitchen olmesartan-hydrochlorothiazide (BENICAR HCT) 20-12.5 MG tablet Take 1 tablet by mouth daily as needed.  Marland Kitchen olopatadine (PATANOL) 0.1 % ophthalmic solution INSTILL 1 DROP INTO BOTH EYES DAILY  . Olopatadine HCl (PAZEO) 0.7 % SOLN Place 1 drop into both eyes 1 day or 1 dose. (Patient taking differently: Place 1 drop into both eyes daily as needed. )  . omeprazole (PRILOSEC) 20 MG capsule Take 1 capsule (20 mg total) by mouth daily.   No facility-administered encounter medications on file as of 12/28/2017.   :  Review of Systems:  Out of a complete 14 point review of systems, all  are reviewed and negative with the exception of these symptoms as listed below: Review of Systems  Neurological:       Pt presents today to discuss his sleep study results. Pt has yet to start auto pap due to finances.    Objective:  Neurological Exam  Physical Exam Physical Examination:   Vitals:   12/28/17 1306  BP: (!) 157/102  Pulse: 92    General Examination: The patient is a very pleasant 47 y.o. male in no acute distress. He appears well-developed and well-nourished and well groomed.   HEENT: Normocephalic, atraumatic, EOMI. Hearing is grossly intact. Face is symmetric with normal facial animation and normal facial sensation. Speech is clear with no dysarthria noted. There is no hypophonia. There is no lip, neck/head, jaw or voice tremor.    Chest: Clear to auscultation without wheezing, rhonchi or crackles noted.  Heart: S1+S2+0, regular and normal without murmurs, rubs or gallops noted.   Abdomen: no obvious change.   Extremities: There is obvious change.   Skin: Warm and dry without trophic changes noted.  Musculoskeletal: exam reveals no obvious joint deformities, tenderness or joint swelling or erythema.   Neurologically:  Mental status: The patient is awake, alert and oriented. His immediate and remote memory, attention, language skills and fund of knowledge are appropriate. Mood is normal and affect is normal.  Cranial nerves II - XII are as described above under HEENT exam.  Motor exam: Normal bulk. No tremor. Fine motor skills and coordination: grossly intact.  Cerebellar testing: No dysmetria or intention tremor. There is no truncal or gait ataxia.  Sensory exam: intact to light touch in the upper and lower extremities.  Gait, station and balance: He stands easily. No veering to one side is noted. No leaning to one side is noted. Posture is age-appropriate and stance is narrow based. Gait shows normal stride length and normal pace. No problems turning  are noted.    Assessment and Plan:  In summary, Olumide Dolinger  Lisanti is a very pleasant 47 year old male with an underlying medical history of type 2 diabetes, diabetic neuropathy, hypertension, fatty liver, reflux disease, morbid obesity with a BMI of over 45, history of depression, hyperlipidemia and recent hospital admission in October 2018 for concern for TIA with transient left facial droop and vision change, who Presents for follow-up consultation of his severe obstructive sleep apnea. He had a prior diagnosis of obstructive sleep apnea and was on CPAP therapy in the past. He had a home sleep test on 07/22/2017 which showed severe sleep apnea with an AHI of 43.2 per hour, average oxygen saturation of 93%, nadir of 66%. He is advised that he has severe sleep apnea and should not remain untreated. He is willing to proceed with AutoPap therapy. [facility]. We will resubmit the order to his DME of choice. He is advised that he will need a follow-up appointment in the next 10 weeks or so. In addition, he would like to see about coming in for the lab attended sleep study. I advised him that his insurance may or may not cover him for a lab attended sleep study, nevertheless, I will resubmit an order for a sleep study and we will call him. I answered all his questions today and he was in agreement.

## 2017-12-28 NOTE — Progress Notes (Signed)
Pt asked that his auto pap order be sent to [facility]. Order sent to [facility] per pt request.

## 2017-12-28 NOTE — Patient Instructions (Addendum)
We will resubmit your autoPAP order to [facility].  As requested, I will resubmit your sleep study request for an in-lab study. Your home sleep test showed severe sleep apnea. This should be treated.  We will have you follow up in about 3 months for follow up on your symptoms, and compliance check on your autoPAP therapy.

## 2018-01-06 DIAGNOSIS — G4733 Obstructive sleep apnea (adult) (pediatric): Secondary | ICD-10-CM | POA: Diagnosis not present

## 2018-01-06 DIAGNOSIS — M79601 Pain in right arm: Secondary | ICD-10-CM | POA: Diagnosis not present

## 2018-01-06 DIAGNOSIS — Z6841 Body Mass Index (BMI) 40.0 and over, adult: Secondary | ICD-10-CM | POA: Diagnosis not present

## 2018-01-06 MED FILL — NAPROXEN 500 MG TABLET: 500 | 15 days supply | Qty: 30 | Fill #0

## 2018-01-06 MED FILL — METHOCARBAMOL 500 MG TABLET: 500 | 7 days supply | Qty: 30 | Fill #0

## 2018-01-07 ENCOUNTER — Telehealth: Payer: Self-pay

## 2018-01-07 MED FILL — MONTELUKAST SOD 10 MG TAB: 10 | 30 days supply | Qty: 30 | Fill #0

## 2018-01-07 MED FILL — clonazePAM 1 MG TABS: 1 | 30 days supply | Qty: 30 | Fill #0

## 2018-01-07 MED FILL — JANUMET 50-1,000 MG TABLET: 50-1000 | 30 days supply | Qty: 60 | Fill #1

## 2018-01-07 NOTE — Telephone Encounter (Signed)
Received notice from [facility] that pt needs to be seen in the office for his cpap follow up sooner than 04/08/18, pt's current appt is 9/24. I called pt, he is agreeable to an appt on 9/5 at 9:30am with Dr. Rexene Alberts since there are no openings with the NP currently. Pt verbalized understanding of new appt date and time.

## 2018-01-08 MED FILL — JARDIANCE 25 MG TABLET: 25 | 30 days supply | Qty: 30 | Fill #0

## 2018-01-11 MED FILL — TRULICITY 1.5 MG/0.5 ML PEN: 1.5 | 28 days supply | Qty: 2 | Fill #1

## 2018-01-11 MED FILL — predniSONE 20 MG TABS: 20 | 4 days supply | Qty: 4 | Fill #0

## 2018-01-15 ENCOUNTER — Other Ambulatory Visit: Payer: Self-pay | Admitting: Endocrinology

## 2018-01-15 DIAGNOSIS — M79601 Pain in right arm: Secondary | ICD-10-CM

## 2018-01-18 ENCOUNTER — Ambulatory Visit
Admission: RE | Admit: 2018-01-18 | Discharge: 2018-01-18 | Disposition: A | Discharge status: Home / Self Care | Payer: 59 | Source: Ambulatory Visit | Attending: Endocrinology | Admitting: Endocrinology

## 2018-01-18 ENCOUNTER — Other Ambulatory Visit: Payer: Self-pay

## 2018-01-18 ENCOUNTER — Telehealth: Payer: Self-pay

## 2018-01-18 DIAGNOSIS — M50222 Other cervical disc displacement at C5-C6 level: Secondary | ICD-10-CM | POA: Diagnosis not present

## 2018-01-18 DIAGNOSIS — M79601 Pain in right arm: Secondary | ICD-10-CM

## 2018-01-18 NOTE — Telephone Encounter (Signed)
[employer] denied in lab sleep study.They want [place] to use HST from 2018 to get set up on Auto first.

## 2018-01-19 ENCOUNTER — Other Ambulatory Visit: Payer: Self-pay | Admitting: Endocrinology

## 2018-01-19 DIAGNOSIS — M79601 Pain in right arm: Secondary | ICD-10-CM

## 2018-01-20 ENCOUNTER — Other Ambulatory Visit: Payer: Self-pay | Admitting: Neurosurgery

## 2018-01-20 DIAGNOSIS — Z6841 Body Mass Index (BMI) 40.0 and over, adult: Secondary | ICD-10-CM | POA: Diagnosis not present

## 2018-01-20 DIAGNOSIS — M5412 Radiculopathy, cervical region: Secondary | ICD-10-CM | POA: Diagnosis not present

## 2018-01-21 DIAGNOSIS — K760 Fatty (change of) liver, not elsewhere classified: Secondary | ICD-10-CM | POA: Diagnosis not present

## 2018-01-21 DIAGNOSIS — E1149 Type 2 diabetes mellitus with other diabetic neurological complication: Secondary | ICD-10-CM | POA: Diagnosis not present

## 2018-01-21 DIAGNOSIS — Z1389 Encounter for screening for other disorder: Secondary | ICD-10-CM | POA: Diagnosis not present

## 2018-01-21 DIAGNOSIS — M5412 Radiculopathy, cervical region: Secondary | ICD-10-CM | POA: Diagnosis not present

## 2018-01-21 DIAGNOSIS — F329 Major depressive disorder, single episode, unspecified: Secondary | ICD-10-CM | POA: Diagnosis not present

## 2018-01-21 DIAGNOSIS — G4733 Obstructive sleep apnea (adult) (pediatric): Secondary | ICD-10-CM | POA: Diagnosis not present

## 2018-01-21 DIAGNOSIS — Z6841 Body Mass Index (BMI) 40.0 and over, adult: Secondary | ICD-10-CM | POA: Diagnosis not present

## 2018-01-21 DIAGNOSIS — I10 Essential (primary) hypertension: Secondary | ICD-10-CM | POA: Diagnosis not present

## 2018-01-21 MED FILL — HYDROCODON-APAP 10-325: 10-325 | 12 days supply | Qty: 50 | Fill #0

## 2018-01-22 ENCOUNTER — Other Ambulatory Visit: Payer: Self-pay | Admitting: *Deleted

## 2018-01-22 DIAGNOSIS — I779 Disorder of arteries and arterioles, unspecified: Secondary | ICD-10-CM | POA: Diagnosis not present

## 2018-01-22 DIAGNOSIS — R82998 Other abnormal findings in urine: Secondary | ICD-10-CM | POA: Diagnosis not present

## 2018-01-22 DIAGNOSIS — E1149 Type 2 diabetes mellitus with other diabetic neurological complication: Secondary | ICD-10-CM | POA: Diagnosis not present

## 2018-01-22 NOTE — Patient Outreach (Signed)
[facility]) Care Management  01/22/2018  JESSIAH STEINHART [birth year] [id number]   Subjective: Telephone call to patient's home number, no answer, left HIPAA compliant voicemail message, and requested call back.      Objective: Per KPN (Knowledge Performance Now, point of care tool) and chart review,  Patient to be admitted 02/01/18 for surgery at [facility].   Patient also has a history of OSA, hyperlipidemia, hypertension, diabetes, and diabetic neuropathy.       Assessment: Received UMR Preoperative / Transition of care referral on 6/27/419.  Preoperative call follow up pending patient contact.      Plan: RNCM will call patient for 2nd telephone outreach attempt, preoperative call follow up, within 10 business days if no return call.      Azelia Reiger H. Annia Friendly, BSN, [place] Management [facility] Telephonic CM Phone: [phone] Fax: [phone]

## 2018-01-25 ENCOUNTER — Other Ambulatory Visit: Payer: Self-pay

## 2018-01-25 ENCOUNTER — Encounter (HOSPITAL_COMMUNITY): Payer: Self-pay

## 2018-01-25 ENCOUNTER — Encounter (HOSPITAL_COMMUNITY)
Admission: RE | Admit: 2018-01-25 | Discharge: 2018-01-25 | Disposition: A | Discharge status: Home / Self Care | Payer: 59 | Source: Ambulatory Visit | Attending: Neurosurgery | Admitting: Neurosurgery

## 2018-01-25 ENCOUNTER — Other Ambulatory Visit: Payer: Self-pay | Admitting: *Deleted

## 2018-01-25 DIAGNOSIS — Z7982 Long term (current) use of aspirin: Secondary | ICD-10-CM | POA: Diagnosis not present

## 2018-01-25 DIAGNOSIS — Z79899 Other long term (current) drug therapy: Secondary | ICD-10-CM | POA: Insufficient documentation

## 2018-01-25 DIAGNOSIS — Z6841 Body Mass Index (BMI) 40.0 and over, adult: Secondary | ICD-10-CM | POA: Diagnosis not present

## 2018-01-25 DIAGNOSIS — Z01812 Encounter for preprocedural laboratory examination: Secondary | ICD-10-CM | POA: Diagnosis not present

## 2018-01-25 DIAGNOSIS — E119 Type 2 diabetes mellitus without complications: Secondary | ICD-10-CM | POA: Insufficient documentation

## 2018-01-25 DIAGNOSIS — I10 Essential (primary) hypertension: Secondary | ICD-10-CM | POA: Diagnosis not present

## 2018-01-25 DIAGNOSIS — G4733 Obstructive sleep apnea (adult) (pediatric): Secondary | ICD-10-CM | POA: Diagnosis not present

## 2018-01-25 DIAGNOSIS — Z8673 Personal history of transient ischemic attack (TIA), and cerebral infarction without residual deficits: Secondary | ICD-10-CM | POA: Diagnosis not present

## 2018-01-25 HISTORY — DX: Transient cerebral ischemic attack, unspecified: G45.9

## 2018-01-25 HISTORY — DX: Sleep apnea, unspecified: G47.30

## 2018-01-25 LAB — GLUCOSE, CAPILLARY: Glucose-Capillary: 124 mg/dL — ABNORMAL HIGH (ref 70–99)

## 2018-01-25 LAB — SURGICAL PCR SCREEN
MRSA, PCR: NEGATIVE
STAPHYLOCOCCUS AUREUS: POSITIVE — AB

## 2018-01-25 MED FILL — EZETIMIBE 10 MG TABLET: 10 | 90 days supply | Qty: 90 | Fill #0

## 2018-01-25 MED FILL — MUPIROCIN 2% OINTMENT: 2 | 5 days supply | Qty: 22 | Fill #0

## 2018-01-25 NOTE — Telephone Encounter (Signed)
We have already sent pt up on auto pap. He wanted [place] to resubmit for an in lab study.

## 2018-01-25 NOTE — Telephone Encounter (Signed)
Yes, I see that but patient wanted [place] to check with insurance again to see of they would cover.

## 2018-01-25 NOTE — Patient Outreach (Signed)
[facility]) Care Management  01/25/2018  Stephen Dunlap [birth year] [id number]   Subjective: Telephone call to patient's home / mobile number, spoke with patient, and HIPAA verified.  Discussed [facility] Care Management UMR Transition of care follow up, patient voiced understanding, and is in agreement to follow up.    Patient states he is ready for surgery on 02/01/18 at [facility], estimated length of stay is 1 day, and primary MD's office will call him post surgery to set up follow up appointment.  Patient states he is able to manage self care and has assistance as needed with activities of daily living / home management.  Patient voices understanding of medical diagnosis, pending surgery, and treatment plan.  States he is accessing the following [facility] benefits: outpatient pharmacy, and hospital indemnity (not sure will ask wife who is [employer] Employee to follow up if appropriate).   Patient states he does not have any preoperative questions, care coordination, disease management, disease monitoring, transportation, community resource, or pharmacy needs at this time.  States he is very appreciative of the follow up and is in agreement to receive [facility] Management information post transition of care follow up.      Objective: Per KPN (Knowledge Performance Now, point of care tool) and chart review,  Patient to be admitted 02/01/18 for surgery at [facility].   Patient also has a history of OSA, hyperlipidemia, hypertension, diabetes, and diabetic neuropathy.       Assessment: Received UMR Preoperative / Transition of care referral on 6/27/419.   Preoperative call completed, and transition of care follow up pending notification of patient discharge.     Plan: RNCM will call patient for  telephone outreach attempt, transition of care follow up, within 3 business days of hospital discharge notification.      Idy Rawling H. Annia Friendly, BSN, [place]  Management [facility] Telephonic CM Phone: [phone] Fax: [phone]

## 2018-01-25 NOTE — Progress Notes (Addendum)
PCP - Reynold Bowen Cardiologist - denies  Chest x-ray - 04/29/17 EKG - 04/30/17 ECHO - 04/30/17  Sleep Study - 06/2017 CPAP - pressures settings 4-10  Fasting Blood Sugar - 120-127 per patient Checks Blood Sugar - 1-2 times a day  Aspirin Instructions: Stop ASA 7 days prior to surgery  BP today elevated 150/90-100. Pt reports BP is normally 220-266 systolic over 91-67, pt reports he is in a lot of pain today and has not had BP issues. Pt states that on Thursday his BP was fine when he visited Dr. Forde Dandy- Lat office note requested from PCP  Patient denies shortness of breath, fever, cough and chest pain at PAT appointment  Patient verbalized understanding of instructions that were given to them at the PAT appointment. Patient was also instructed that they will need to review over the PAT instructions again at home before surgery.

## 2018-01-25 NOTE — Progress Notes (Signed)
Anesthesia Chart Review:   Case:  [id number] Date/Time:  02/01/18 1200   Procedure:  ACDF - C6-C7 - C7-T1 (N/A )   Anesthesia type:  General   Pre-op diagnosis:  Stenosis   Location:  [facility] OR ROOM 59 / [place] OR   Surgeon:  Earnie Larsson, MD      DISCUSSION: - Pt is a 47 year old male with hx HTN, DM, TIA (04/2017), OSA.   - Hospitalized 10/3-10/4/19 for possible TIA  - Pt has medical clearance for surgery   VS: BP (!) 153/99   Pulse 76   Temp 36.8 C   Resp 20   Ht 6\' 5"  (1.956 m)   Wt (!) 397 lb 11.2 oz (180.4 kg)   SpO2 100%   BMI 47.16 kg/m  - Pt does not have HTN.  BP at PCP's office 01/21/18 was 130/84   PROVIDERS: PCP is Reynold Bowen, MD who cleared pt for surgery at last office visit 01/21/18   LABS: Labs from PCP's office 01/21/18 reviewed - HbA1c was 6.8  - CMP and CBC with diff acceptable for surgery  (all labs ordered are listed, but only abnormal results are displayed)  Labs Reviewed  SURGICAL PCR SCREEN - Abnormal; Notable for the following components:      Result Value   Staphylococcus aureus POSITIVE (*)    All other components within normal limits  GLUCOSE, CAPILLARY - Abnormal; Notable for the following components:   Glucose-Capillary 124 (*)    All other components within normal limits     IMAGES:  CXR 04/29/17:  1. No active cardiopulmonary disease. 2. Mild elevation of the left hemidiaphragm, similar to prior, and likely chronic.   EKG 04/29/17: Sinus rhythm   CV:  Echo 05/03/17:  - Left ventricle: The cavity size was normal. There was moderate concentric hypertrophy. Systolic function was normal. The estimated ejection fraction was in the range of 60% to 65%. Wall motion was normal; there were no regional wall motion abnormalities. Doppler parameters are consistent with abnormal left ventricular relaxation (grade 1 diastolic dysfunction). Doppler parameters are consistent with indeterminate ventricular filling pressure. - Aortic valve:  Transvalvular velocity was within the normal range. There was no stenosis. There was no regurgitation. - Mitral valve: Transvalvular velocity was within the normal range. There was no evidence for stenosis. There was no regurgitation. - Right ventricle: The cavity size was normal. Wall thickness was normal. Systolic function was normal. - Atrial septum: No defect or patent foramen ovale was identified. - Tricuspid valve: There was no regurgitation. - Impressions: Endocardial border definition was poor, but systolic function appears normal.  Carotid duplex 04/30/17:  - The vertebral arteries appear patent with antegrade flow. - Findings consistent with 1-39 percent stenosis involving theright internal carotid artery and the left internal carotidartery.   Past Medical History:  Diagnosis Date  . Diabetes mellitus   . Hypertension   . Morbid obesity ([place])   . Prostatitis   . Sleep apnea    CPAP machine, 4-10 pressure settings  . TIA (transient ischemic attack)    October 2018, possible?  . UTI (urinary tract infection)     Past Surgical History:  Procedure Laterality Date  . cysto bedside    . HERNIA REPAIR    . WISDOM TOOTH EXTRACTION      MEDICATIONS: . albuterol (PROVENTIL HFA;VENTOLIN HFA) 108 (90 BASE) MCG/ACT inhaler  . aspirin EC 81 MG tablet  . atorvastatin (LIPITOR) 40 MG tablet  . clonazePAM (KLONOPIN)  1 MG tablet  . empagliflozin (JARDIANCE) 25 MG TABS tablet  . EPINEPHrine (AUVI-Q) 0.3 mg/0.3 mL IJ SOAJ injection  . fexofenadine (ALLEGRA) 180 MG tablet  . FIBER ADULT GUMMIES 2 g CHEW  . fluticasone (FLONASE) 50 MCG/ACT nasal spray  . ibuprofen (ADVIL,MOTRIN) 800 MG tablet  . JANUMET 50-1000 MG tablet  . mometasone (NASONEX) 50 MCG/ACT nasal spray  . montelukast (SINGULAIR) 10 MG tablet  . Multiple Vitamin (MULTIVITAMIN) tablet  . nystatin cream (MYCOSTATIN)  . olopatadine (PATANOL) 0.1 % ophthalmic solution  . Olopatadine HCl (PAZEO) 0.7 % SOLN  .  omeprazole (PRILOSEC) 20 MG capsule  . TRULICITY 1.5 VJ/5.0NX SOPN   No current facility-administered medications for this encounter.     If no changes, I anticipate pt can proceed with surgery as scheduled.   Willeen Cass, FNP-BC [facility]/Anesthesiology Phone: [phone] 01/25/2018 2:33 PM

## 2018-01-25 NOTE — Pre-Procedure Instructions (Signed)
Stephen Dunlap  01/25/2018      [place], [place] - [place] [address] Phone: [phone] Fax: [phone]  [place], [place] - [place] [address] Phone: [phone] Fax: [phone]    Your procedure is scheduled on Monday July 8 .  Report to [facility] Admitting at 10:15 A.M.  Call this number if you have problems the morning of surgery:  [phone]   Remember:  Do not eat or drink after midnight.    Take these medicines the morning of surgery with A SIP OF WATER:   Omeprazole (prilosec) Albuterol if needed- bring inhaler to hospital with you flonase if needed  DO NOT TAKE empagliflozin (JardianE), Janumet, or Trulicity the day of surgery  7 days prior to surgery STOP taking any Aspirin(unless otherwise instructed by your surgeon), Aleve, Naproxen, Ibuprofen, Motrin, Advil, Goody's, BC's, all herbal medications, fish oil, and all vitamins  **FOLLOW your surgeon's instructions on stopping Aspirin**    How to Manage Your Diabetes Before and After Surgery  Why is it important to control my blood sugar before and after surgery? . Improving blood sugar levels before and after surgery helps healing and can limit problems. . A way of improving blood sugar control is eating a healthy diet by: o  Eating less sugar and carbohydrates o  Increasing activity/exercise o  Talking with your doctor about reaching your blood sugar goals . High blood sugars (greater than 180 mg/dL) can raise your risk of infections and slow your recovery, so you will need to focus on controlling your diabetes during the weeks before surgery. . Make sure that the doctor who takes care of your diabetes knows about your planned surgery including the date and location.  How do I manage my blood sugar before surgery? . Check your blood sugar at  least 4 times a day, starting 2 days before surgery, to make sure that the level is not too high or low. o Check your blood sugar the morning of your surgery when you wake up and every 2 hours until you get to the Short Stay unit. . If your blood sugar is less than 70 mg/dL, you will need to treat for low blood sugar: o Do not take insulin. o Treat a low blood sugar (less than 70 mg/dL) with  cup of clear juice (cranberry or apple), 4 glucose tablets, OR glucose gel. Recheck blood sugar in 15 minutes after treatment (to make sure it is greater than 70 mg/dL). If your blood sugar is not greater than 70 mg/dL on recheck, call [phone] o  for further instructions. . Report your blood sugar to the short stay nurse when you get to Short Stay.  . If you are admitted to the hospital after surgery: o Your blood sugar will be checked by the staff and you will probably be given insulin after surgery (instead of oral diabetes medicines) to make sure you have good blood sugar levels. o The goal for blood sugar control after surgery is 80-180 mg/dL.              Do not wear jewelry, make-up or nail polish.  Do not wear lotions, powders, or perfumes, or deodorant.  Do not shave 48 hours prior to surgery.  Men may shave face and neck.  Do not bring valuables to the hospital.  [facility] is not responsible for any belongings or valuables.  Contacts, dentures or bridgework may not be worn into surgery.  Leave your suitcase in the car.  After surgery it may be brought to your room.  For patients admitted to the hospital, discharge time will be determined by your treatment team.  Patients discharged the day of surgery will not be allowed to drive home.   Special instructions:   .prep   Please read over the following fact sheets that you were given. Coughing and Deep Breathing, MRSA Information and Surgical Site Infection Prevention

## 2018-01-29 MED ORDER — DEXTROSE 5 % IV SOLN
3.0000 g | INTRAVENOUS | Status: AC
  Filled 2018-01-29: qty 3000

## 2018-01-29 MED ORDER — DEXAMETHASONE SODIUM PHOSPHATE 10 MG/ML IJ SOLN: 10.0000 mg | INTRAMUSCULAR | Status: AC

## 2018-02-05 DIAGNOSIS — G4733 Obstructive sleep apnea (adult) (pediatric): Secondary | ICD-10-CM | POA: Diagnosis not present

## 2018-02-08 ENCOUNTER — Ambulatory Visit (HOSPITAL_COMMUNITY): Payer: 59 | Admitting: Emergency Medicine

## 2018-02-08 ENCOUNTER — Encounter (HOSPITAL_COMMUNITY): Payer: Self-pay

## 2018-02-08 ENCOUNTER — Observation Stay (HOSPITAL_COMMUNITY)
Admission: AD | Admit: 2018-02-08 | Discharge: 2018-02-09 | Disposition: A | Discharge status: Home / Self Care | Payer: 59 | Source: Ambulatory Visit | Attending: Neurosurgery | Admitting: Neurosurgery

## 2018-02-08 ENCOUNTER — Ambulatory Visit (HOSPITAL_COMMUNITY): Payer: 59 | Admitting: Certified Registered Nurse Anesthetist

## 2018-02-08 ENCOUNTER — Encounter (HOSPITAL_COMMUNITY)
Admission: AD | Disposition: A | Discharge status: Home / Self Care | Payer: Self-pay | Source: Ambulatory Visit | Attending: Neurosurgery

## 2018-02-08 ENCOUNTER — Other Ambulatory Visit: Payer: Self-pay

## 2018-02-08 ENCOUNTER — Ambulatory Visit (HOSPITAL_COMMUNITY): Payer: 59

## 2018-02-08 DIAGNOSIS — M4803 Spinal stenosis, cervicothoracic region: Secondary | ICD-10-CM | POA: Diagnosis not present

## 2018-02-08 DIAGNOSIS — Z7982 Long term (current) use of aspirin: Secondary | ICD-10-CM | POA: Insufficient documentation

## 2018-02-08 DIAGNOSIS — E119 Type 2 diabetes mellitus without complications: Secondary | ICD-10-CM | POA: Insufficient documentation

## 2018-02-08 DIAGNOSIS — Z6841 Body Mass Index (BMI) 40.0 and over, adult: Secondary | ICD-10-CM | POA: Diagnosis not present

## 2018-02-08 DIAGNOSIS — Z79899 Other long term (current) drug therapy: Secondary | ICD-10-CM | POA: Insufficient documentation

## 2018-02-08 DIAGNOSIS — Z833 Family history of diabetes mellitus: Secondary | ICD-10-CM | POA: Diagnosis not present

## 2018-02-08 DIAGNOSIS — Z888 Allergy status to other drugs, medicaments and biological substances status: Secondary | ICD-10-CM | POA: Diagnosis not present

## 2018-02-08 DIAGNOSIS — Z8249 Family history of ischemic heart disease and other diseases of the circulatory system: Secondary | ICD-10-CM | POA: Diagnosis not present

## 2018-02-08 DIAGNOSIS — M4802 Spinal stenosis, cervical region: Secondary | ICD-10-CM | POA: Diagnosis not present

## 2018-02-08 DIAGNOSIS — M5013 Cervical disc disorder with radiculopathy, cervicothoracic region: Secondary | ICD-10-CM | POA: Diagnosis not present

## 2018-02-08 DIAGNOSIS — M50123 Cervical disc disorder at C6-C7 level with radiculopathy: Secondary | ICD-10-CM | POA: Diagnosis not present

## 2018-02-08 DIAGNOSIS — Z7951 Long term (current) use of inhaled steroids: Secondary | ICD-10-CM | POA: Diagnosis not present

## 2018-02-08 DIAGNOSIS — N419 Inflammatory disease of prostate, unspecified: Secondary | ICD-10-CM | POA: Diagnosis not present

## 2018-02-08 DIAGNOSIS — Z7984 Long term (current) use of oral hypoglycemic drugs: Secondary | ICD-10-CM | POA: Insufficient documentation

## 2018-02-08 DIAGNOSIS — M5412 Radiculopathy, cervical region: Secondary | ICD-10-CM | POA: Diagnosis present

## 2018-02-08 DIAGNOSIS — Z981 Arthrodesis status: Secondary | ICD-10-CM | POA: Diagnosis not present

## 2018-02-08 DIAGNOSIS — Z803 Family history of malignant neoplasm of breast: Secondary | ICD-10-CM | POA: Insufficient documentation

## 2018-02-08 DIAGNOSIS — I10 Essential (primary) hypertension: Secondary | ICD-10-CM | POA: Diagnosis not present

## 2018-02-08 DIAGNOSIS — Z91018 Allergy to other foods: Secondary | ICD-10-CM | POA: Diagnosis not present

## 2018-02-08 DIAGNOSIS — Z8673 Personal history of transient ischemic attack (TIA), and cerebral infarction without residual deficits: Secondary | ICD-10-CM | POA: Insufficient documentation

## 2018-02-08 DIAGNOSIS — G4730 Sleep apnea, unspecified: Secondary | ICD-10-CM | POA: Insufficient documentation

## 2018-02-08 DIAGNOSIS — K219 Gastro-esophageal reflux disease without esophagitis: Secondary | ICD-10-CM | POA: Diagnosis not present

## 2018-02-08 DIAGNOSIS — Z8261 Family history of arthritis: Secondary | ICD-10-CM | POA: Insufficient documentation

## 2018-02-08 DIAGNOSIS — Z419 Encounter for procedure for purposes other than remedying health state, unspecified: Secondary | ICD-10-CM

## 2018-02-08 HISTORY — PX: ANTERIOR CERVICAL DECOMP/DISCECTOMY FUSION: SHX1161

## 2018-02-08 LAB — GLUCOSE, CAPILLARY
Glucose-Capillary: 141 mg/dL — ABNORMAL HIGH (ref 70–99)
Glucose-Capillary: 174 mg/dL — ABNORMAL HIGH (ref 70–99)
Glucose-Capillary: 163 mg/dL — ABNORMAL HIGH (ref 70–99)
Glucose-Capillary: 244 mg/dL — ABNORMAL HIGH (ref 70–99)

## 2018-02-08 SURGERY — ANTERIOR CERVICAL DECOMPRESSION/DISCECTOMY FUSION 2 LEVELS
Anesthesia: General | Site: Spine Cervical

## 2018-02-08 MED ORDER — LINAGLIPTIN 5 MG PO TABS: 5.0000 mg | ORAL_TABLET | Freq: Every day | ORAL | Status: DC

## 2018-02-08 MED ORDER — CYCLOBENZAPRINE HCL 10 MG PO TABS
10.0000 mg | ORAL_TABLET | Freq: Three times a day (TID) | ORAL | Status: DC | PRN
  Administered 2018-02-08 (×2): 10 mg via ORAL
  Filled 2018-02-08: qty 1

## 2018-02-08 MED ORDER — HYDROMORPHONE HCL 1 MG/ML IJ SOLN
1.0000 mg | INTRAMUSCULAR | Status: DC | PRN
  Filled 2018-02-08: qty 1

## 2018-02-08 MED ORDER — SODIUM CHLORIDE 0.9 % IV SOLN: 250.0000 mL | INTRAVENOUS | Status: DC

## 2018-02-08 MED ORDER — SITAGLIPTIN PHOS-METFORMIN HCL 50-1000 MG PO TABS: 1.0000 | ORAL_TABLET | Freq: Two times a day (BID) | ORAL | Status: DC

## 2018-02-08 MED ORDER — THROMBIN 5000 UNITS EX SOLR
CUTANEOUS | Status: AC
  Filled 2018-02-08: qty 10000

## 2018-02-08 MED ORDER — CEFAZOLIN SODIUM-DEXTROSE 2-3 GM-%(50ML) IV SOLR
INTRAVENOUS | Status: DC | PRN
  Administered 2018-02-08: 2 g via INTRAVENOUS
  Administered 2018-02-08: 1 g via INTRAVENOUS

## 2018-02-08 MED ORDER — ACETAMINOPHEN 650 MG RE SUPP
650.0000 mg | RECTAL | Status: DC | PRN
Start: 2018-02-08 — End: 2018-02-09

## 2018-02-08 MED ORDER — SODIUM CHLORIDE 0.9 % IV SOLN
INTRAVENOUS | Status: DC | PRN
  Administered 2018-02-08: 500 mL

## 2018-02-08 MED ORDER — SODIUM CHLORIDE 0.9% FLUSH
3.0000 mL | Freq: Two times a day (BID) | INTRAVENOUS | Status: DC
  Administered 2018-02-08: 3 mL via INTRAVENOUS

## 2018-02-08 MED ORDER — FENTANYL CITRATE (PF) 100 MCG/2ML IJ SOLN
25.0000 ug | INTRAMUSCULAR | Status: DC | PRN
  Administered 2018-02-08 (×2): 50 ug via INTRAVENOUS

## 2018-02-08 MED ORDER — FLUTICASONE PROPIONATE 50 MCG/ACT NA SUSP: 1.0000 | Freq: Every day | NASAL | Status: DC | PRN

## 2018-02-08 MED ORDER — NON FORMULARY: 25.0000 mg | Freq: Every day | Status: DC

## 2018-02-08 MED ORDER — THROMBIN 5000 UNITS EX SOLR
CUTANEOUS | Status: DC | PRN
  Administered 2018-02-08 (×2): 5000 [IU] via TOPICAL

## 2018-02-08 MED ORDER — HYDROCODONE-ACETAMINOPHEN 10-325 MG PO TABS
2.0000 | ORAL_TABLET | ORAL | Status: DC | PRN
  Administered 2018-02-08: 1 via ORAL
  Administered 2018-02-08: 2 via ORAL
  Administered 2018-02-08: 1 via ORAL
  Administered 2018-02-09 (×3): 2 via ORAL
  Filled 2018-02-08 (×4): qty 2

## 2018-02-08 MED ORDER — ACETAMINOPHEN 325 MG PO TABS: 650.0000 mg | ORAL_TABLET | ORAL | Status: DC | PRN

## 2018-02-08 MED ORDER — FENTANYL CITRATE (PF) 250 MCG/5ML IJ SOLN
INTRAMUSCULAR | Status: DC | PRN
  Administered 2018-02-08: 100 ug via INTRAVENOUS
  Administered 2018-02-08: 50 ug via INTRAVENOUS
  Administered 2018-02-08: 25 ug via INTRAVENOUS
  Administered 2018-02-08: 50 ug via INTRAVENOUS
  Administered 2018-02-08: 25 ug via INTRAVENOUS
  Administered 2018-02-08: 100 ug via INTRAVENOUS

## 2018-02-08 MED ORDER — FENTANYL CITRATE (PF) 250 MCG/5ML IJ SOLN
INTRAMUSCULAR | Status: AC
  Filled 2018-02-08: qty 5

## 2018-02-08 MED ORDER — PHENOL 1.4 % MT LIQD: 1.0000 | OROMUCOSAL | Status: DC | PRN

## 2018-02-08 MED ORDER — LACTATED RINGERS IV SOLN
INTRAVENOUS | Status: DC | PRN
  Administered 2018-02-08: 11:00:00 via INTRAVENOUS

## 2018-02-08 MED ORDER — METOCLOPRAMIDE HCL 5 MG/ML IJ SOLN: 10.0000 mg | Freq: Once | INTRAMUSCULAR | Status: DC | PRN

## 2018-02-08 MED ORDER — CLONAZEPAM 1 MG PO TABS
1.0000 mg | ORAL_TABLET | Freq: Every day | ORAL | Status: DC
  Administered 2018-02-08: 1 mg via ORAL
  Filled 2018-02-08: qty 1

## 2018-02-08 MED ORDER — INSULIN ASPART 100 UNIT/ML ~~LOC~~ SOLN
0.0000 [IU] | Freq: Three times a day (TID) | SUBCUTANEOUS | Status: DC
Start: 2018-02-08 — End: 2018-02-09

## 2018-02-08 MED ORDER — FIBER ADULT GUMMIES 2 G PO CHEW: 5.0000 | CHEWABLE_TABLET | Freq: Every day | ORAL | Status: DC | PRN

## 2018-02-08 MED ORDER — PROPOFOL 10 MG/ML IV BOLUS
INTRAVENOUS | Status: DC | PRN
  Administered 2018-02-08: 200 mg via INTRAVENOUS

## 2018-02-08 MED ORDER — LINAGLIPTIN 5 MG PO TABS
5.0000 mg | ORAL_TABLET | Freq: Every day | ORAL | Status: DC
  Administered 2018-02-08 – 2018-02-09 (×2): 5 mg via ORAL
  Filled 2018-02-08 (×2): qty 1

## 2018-02-08 MED ORDER — HEMOSTATIC AGENTS (NO CHARGE) OPTIME
TOPICAL | Status: DC | PRN
  Administered 2018-02-08 (×2): 1 via TOPICAL

## 2018-02-08 MED ORDER — MENTHOL 3 MG MT LOZG: 1.0000 | LOZENGE | OROMUCOSAL | Status: DC | PRN

## 2018-02-08 MED ORDER — ACETAMINOPHEN 10 MG/ML IV SOLN
INTRAVENOUS | Status: AC
  Filled 2018-02-08: qty 100

## 2018-02-08 MED ORDER — PROPOFOL 10 MG/ML IV BOLUS
INTRAVENOUS | Status: AC
  Filled 2018-02-08: qty 20

## 2018-02-08 MED ORDER — DULAGLUTIDE 1.5 MG/0.5ML ~~LOC~~ SOAJ: 1.5000 mg | SUBCUTANEOUS | Status: DC

## 2018-02-08 MED ORDER — SUGAMMADEX SODIUM 500 MG/5ML IV SOLN
INTRAVENOUS | Status: AC
  Filled 2018-02-08: qty 5

## 2018-02-08 MED ORDER — ONDANSETRON HCL 4 MG/2ML IJ SOLN: 4.0000 mg | Freq: Four times a day (QID) | INTRAMUSCULAR | Status: DC | PRN

## 2018-02-08 MED ORDER — HYDROCODONE-ACETAMINOPHEN 10-325 MG PO TABS
ORAL_TABLET | ORAL | Status: AC
  Administered 2018-02-08: 1 via ORAL
  Filled 2018-02-08: qty 1

## 2018-02-08 MED ORDER — EMPAGLIFLOZIN 25 MG PO TABS: 25.0000 mg | ORAL_TABLET | Freq: Every day | ORAL | Status: DC

## 2018-02-08 MED ORDER — LACTATED RINGERS IV SOLN: INTRAVENOUS | Status: DC

## 2018-02-08 MED ORDER — HYDROCODONE-ACETAMINOPHEN 10-325 MG PO TABS
ORAL_TABLET | ORAL | Status: AC
  Filled 2018-02-08: qty 1

## 2018-02-08 MED ORDER — ALBUTEROL SULFATE HFA 108 (90 BASE) MCG/ACT IN AERS: 2.0000 | INHALATION_SPRAY | Freq: Four times a day (QID) | RESPIRATORY_TRACT | Status: DC | PRN

## 2018-02-08 MED ORDER — CEFAZOLIN SODIUM-DEXTROSE 2-4 GM/100ML-% IV SOLN
INTRAVENOUS | Status: AC
  Filled 2018-02-08: qty 100

## 2018-02-08 MED ORDER — PANTOPRAZOLE SODIUM 40 MG PO TBEC
40.0000 mg | DELAYED_RELEASE_TABLET | Freq: Every day | ORAL | Status: DC
  Administered 2018-02-09: 40 mg via ORAL
  Filled 2018-02-08: qty 1

## 2018-02-08 MED ORDER — FENTANYL CITRATE (PF) 100 MCG/2ML IJ SOLN
INTRAMUSCULAR | Status: AC
  Filled 2018-02-08: qty 2

## 2018-02-08 MED ORDER — METFORMIN HCL 500 MG PO TABS: 1000.0000 mg | ORAL_TABLET | Freq: Two times a day (BID) | ORAL | Status: DC

## 2018-02-08 MED ORDER — ONE-DAILY MULTI VITAMINS PO TABS: 1.0000 | ORAL_TABLET | Freq: Every day | ORAL | Status: DC

## 2018-02-08 MED ORDER — CYCLOBENZAPRINE HCL 10 MG PO TABS
ORAL_TABLET | ORAL | Status: AC
  Filled 2018-02-08: qty 1

## 2018-02-08 MED ORDER — ROCURONIUM BROMIDE 10 MG/ML (PF) SYRINGE
PREFILLED_SYRINGE | INTRAVENOUS | Status: DC | PRN
  Administered 2018-02-08: 30 mg via INTRAVENOUS
  Administered 2018-02-08: 100 mg via INTRAVENOUS
  Administered 2018-02-08 (×3): 10 mg via INTRAVENOUS

## 2018-02-08 MED ORDER — OLOPATADINE HCL 0.1 % OP SOLN: 1.0000 [drp] | Freq: Two times a day (BID) | OPHTHALMIC | Status: DC

## 2018-02-08 MED ORDER — CANAGLIFLOZIN 100 MG PO TABS
100.0000 mg | ORAL_TABLET | Freq: Every day | ORAL | Status: DC
  Filled 2018-02-08: qty 1

## 2018-02-08 MED ORDER — CHLORHEXIDINE GLUCONATE CLOTH 2 % EX PADS: 6.0000 | MEDICATED_PAD | Freq: Once | CUTANEOUS | Status: DC

## 2018-02-08 MED ORDER — CEFAZOLIN SODIUM 1 G IJ SOLR
INTRAMUSCULAR | Status: AC
  Filled 2018-02-08: qty 10

## 2018-02-08 MED ORDER — THROMBIN 5000 UNITS EX SOLR: OROMUCOSAL | Status: DC | PRN

## 2018-02-08 MED ORDER — METFORMIN HCL 500 MG PO TABS
1000.0000 mg | ORAL_TABLET | Freq: Two times a day (BID) | ORAL | Status: DC
  Administered 2018-02-08: 1000 mg via ORAL
  Filled 2018-02-08 (×2): qty 2

## 2018-02-08 MED ORDER — 0.9 % SODIUM CHLORIDE (POUR BTL) OPTIME
TOPICAL | Status: DC | PRN
  Administered 2018-02-08: 1000 mL

## 2018-02-08 MED ORDER — LIDOCAINE 2% (20 MG/ML) 5 ML SYRINGE
INTRAMUSCULAR | Status: DC | PRN
  Administered 2018-02-08: 100 mg via INTRAVENOUS

## 2018-02-08 MED ORDER — SODIUM CHLORIDE 0.9% FLUSH: 3.0000 mL | INTRAVENOUS | Status: DC | PRN

## 2018-02-08 MED ORDER — MONTELUKAST SODIUM 10 MG PO TABS
5.0000 mg | ORAL_TABLET | Freq: Every day | ORAL | Status: DC
  Administered 2018-02-08: 5 mg via ORAL
  Filled 2018-02-08: qty 0.5

## 2018-02-08 MED ORDER — ACETAMINOPHEN 10 MG/ML IV SOLN
INTRAVENOUS | Status: DC | PRN
  Administered 2018-02-08: 1000 mg via INTRAVENOUS

## 2018-02-08 MED ORDER — ONDANSETRON HCL 4 MG/2ML IJ SOLN
INTRAMUSCULAR | Status: DC | PRN
  Administered 2018-02-08: 4 mg via INTRAVENOUS

## 2018-02-08 MED ORDER — ROCURONIUM BROMIDE 10 MG/ML (PF) SYRINGE
PREFILLED_SYRINGE | INTRAVENOUS | Status: AC
  Filled 2018-02-08: qty 10

## 2018-02-08 MED ORDER — ONDANSETRON HCL 4 MG PO TABS: 4.0000 mg | ORAL_TABLET | Freq: Four times a day (QID) | ORAL | Status: DC | PRN

## 2018-02-08 MED ORDER — ATORVASTATIN CALCIUM 20 MG PO TABS
40.0000 mg | ORAL_TABLET | Freq: Every day | ORAL | Status: DC
  Administered 2018-02-08: 40 mg via ORAL
  Filled 2018-02-08: qty 2

## 2018-02-08 MED ORDER — CEFAZOLIN SODIUM-DEXTROSE 1-4 GM/50ML-% IV SOLN
1.0000 g | Freq: Three times a day (TID) | INTRAVENOUS | Status: AC
  Administered 2018-02-08 – 2018-02-09 (×2): 1 g via INTRAVENOUS
  Filled 2018-02-08 (×2): qty 50

## 2018-02-08 MED ORDER — LACTATED RINGERS IV SOLN
INTRAVENOUS | Status: DC
  Administered 2018-02-08: 09:00:00 via INTRAVENOUS

## 2018-02-08 MED ORDER — MIDAZOLAM HCL 2 MG/2ML IJ SOLN
INTRAMUSCULAR | Status: AC
  Filled 2018-02-08: qty 2

## 2018-02-08 MED ORDER — HYDROCODONE-ACETAMINOPHEN 5-325 MG PO TABS: 1.0000 | ORAL_TABLET | ORAL | Status: DC | PRN

## 2018-02-08 MED ORDER — MEPERIDINE HCL 50 MG/ML IJ SOLN: 6.2500 mg | INTRAMUSCULAR | Status: DC | PRN

## 2018-02-08 MED ORDER — CANAGLIFLOZIN 100 MG PO TABS: 100.0000 mg | ORAL_TABLET | Freq: Every day | ORAL | Status: DC

## 2018-02-08 SURGICAL SUPPLY — 64 items
APL SKNCLS STERI-STRIP NONHPOA (GAUZE/BANDAGES/DRESSINGS) ×1
BAG DECANTER FOR FLEXI CONT (MISCELLANEOUS) ×2 IMPLANT
BENZOIN TINCTURE PRP APPL 2/3 (GAUZE/BANDAGES/DRESSINGS) ×2 IMPLANT
BIT DRILL 13 (BIT) ×1 IMPLANT
BUR MATCHSTICK NEURO 3.0 LAGG (BURR) ×2 IMPLANT
CAGE END CAP 9X14X11 (Cage) ×1 IMPLANT
CAGE PEEK 8X14X11 (Cage) ×1 IMPLANT
CANISTER SUCT 3000ML PPV (MISCELLANEOUS) ×2 IMPLANT
CARTRIDGE OIL MAESTRO DRILL (MISCELLANEOUS) ×1 IMPLANT
DIFFUSER DRILL AIR PNEUMATIC (MISCELLANEOUS) ×2 IMPLANT
DRAPE C-ARM 42X72 X-RAY (DRAPES) ×4 IMPLANT
DRAPE LAPAROTOMY 100X72 PEDS (DRAPES) ×2 IMPLANT
DRAPE MICROSCOPE LEICA (MISCELLANEOUS) ×2 IMPLANT
DURAPREP 6ML APPLICATOR 50/CS (WOUND CARE) ×2 IMPLANT
ELECT COATED BLADE 2.86 ST (ELECTRODE) ×2 IMPLANT
ELECT REM PT RETURN 9FT ADLT (ELECTROSURGICAL) ×2
ELECTRODE REM PT RTRN 9FT ADLT (ELECTROSURGICAL) ×1 IMPLANT
FLOSEAL 5ML (HEMOSTASIS) ×1 IMPLANT
GAUZE SPONGE 4X4 12PLY STRL (GAUZE/BANDAGES/DRESSINGS) ×2 IMPLANT
GAUZE SPONGE 4X4 12PLY STRL LF (GAUZE/BANDAGES/DRESSINGS) ×1 IMPLANT
GAUZE SPONGE 4X4 16PLY XRAY LF (GAUZE/BANDAGES/DRESSINGS) IMPLANT
GLOVE BIO SURGEON STRL SZ 6 (GLOVE) ×1 IMPLANT
GLOVE BIO SURGEON STRL SZ8 (GLOVE) ×1 IMPLANT
GLOVE BIOGEL PI IND STRL 6.5 (GLOVE) IMPLANT
GLOVE BIOGEL PI INDICATOR 6.5 (GLOVE) ×2
GLOVE ECLIPSE 8.0 STRL XLNG CF (GLOVE) ×1 IMPLANT
GLOVE ECLIPSE 9.0 STRL (GLOVE) ×2 IMPLANT
GLOVE EXAM NITRILE LRG STRL (GLOVE) IMPLANT
GLOVE EXAM NITRILE XL STR (GLOVE) IMPLANT
GLOVE EXAM NITRILE XS STR PU (GLOVE) IMPLANT
GLOVE INDICATOR 8.5 STRL (GLOVE) ×1 IMPLANT
GLOVE SURG SS PI 6.0 STRL IVOR (GLOVE) ×1 IMPLANT
GOWN STRL REUS W/ TWL LRG LVL3 (GOWN DISPOSABLE) IMPLANT
GOWN STRL REUS W/ TWL XL LVL3 (GOWN DISPOSABLE) ×1 IMPLANT
GOWN STRL REUS W/TWL 2XL LVL3 (GOWN DISPOSABLE) IMPLANT
GOWN STRL REUS W/TWL LRG LVL3 (GOWN DISPOSABLE)
GOWN STRL REUS W/TWL XL LVL3 (GOWN DISPOSABLE) ×2
HALTER HD/CHIN CERV TRACTION D (MISCELLANEOUS) ×2 IMPLANT
HEMOSTAT POWDER KIT SURGIFOAM (HEMOSTASIS) IMPLANT
HEMOSTAT SURGICEL 2X14 (HEMOSTASIS) IMPLANT
KIT BASIN OR (CUSTOM PROCEDURE TRAY) ×2 IMPLANT
KIT TURNOVER KIT B (KITS) ×2 IMPLANT
NDL SPNL 20GX3.5 QUINCKE YW (NEEDLE) ×1 IMPLANT
NEEDLE SPNL 20GX3.5 QUINCKE YW (NEEDLE) ×2 IMPLANT
NS IRRIG 1000ML POUR BTL (IV SOLUTION) ×2 IMPLANT
OIL CARTRIDGE MAESTRO DRILL (MISCELLANEOUS) ×2
PACK LAMINECTOMY NEURO (CUSTOM PROCEDURE TRAY) ×2 IMPLANT
PAD ARMBOARD 7.5X6 YLW CONV (MISCELLANEOUS) ×6 IMPLANT
PIN DISTRACTOR 14MM (PIN) ×2 IMPLANT
PLATE ELITE SPINAL 47.5MM (Plate) ×1 IMPLANT
RUBBERBAND STERILE (MISCELLANEOUS) ×4 IMPLANT
SCREW ST 13X4XST VA NS SPNE (Screw) IMPLANT
SCREW ST VAR 4 ATL (Screw) ×12 IMPLANT
SPONGE INTESTINAL PEANUT (DISPOSABLE) ×2 IMPLANT
SPONGE SURGIFOAM ABS GEL SZ50 (HEMOSTASIS) ×2 IMPLANT
STRIP CLOSURE SKIN 1/2X4 (GAUZE/BANDAGES/DRESSINGS) ×2 IMPLANT
SUT VIC AB 3-0 SH 8-18 (SUTURE) ×2 IMPLANT
SUT VIC AB 4-0 RB1 18 (SUTURE) ×2 IMPLANT
TAPE CLOTH 4X10 WHT NS (GAUZE/BANDAGES/DRESSINGS) ×2 IMPLANT
TAPE CLOTH SURG 4X10 WHT LF (GAUZE/BANDAGES/DRESSINGS) ×1 IMPLANT
TOWEL GREEN STERILE (TOWEL DISPOSABLE) ×2 IMPLANT
TOWEL GREEN STERILE FF (TOWEL DISPOSABLE) ×2 IMPLANT
TRAP SPECIMEN MUCOUS 40CC (MISCELLANEOUS) ×2 IMPLANT
WATER STERILE IRR 1000ML POUR (IV SOLUTION) ×2 IMPLANT

## 2018-02-08 NOTE — Anesthesia Preprocedure Evaluation (Signed)
Anesthesia Evaluation  Patient identified by MRN, date of birth, ID band Patient awake    Reviewed: Allergy & Precautions, NPO status , Patient's Chart, lab work & pertinent test results  Airway Mallampati: II  TM Distance: >3 FB Neck ROM: Full    Dental no notable dental hx.    Pulmonary sleep apnea ,    Pulmonary exam normal breath sounds clear to auscultation       Cardiovascular hypertension, Pt. on medications negative cardio ROS Normal cardiovascular exam Rhythm:Regular Rate:Normal     Neuro/Psych negative neurological ROS  negative psych ROS   GI/Hepatic Neg liver ROS, GERD  Medicated and Controlled,  Endo/Other  diabetes, Type 2Morbid obesity  Renal/GU negative Renal ROS  negative genitourinary   Musculoskeletal negative musculoskeletal ROS (+)   Abdominal   Peds negative pediatric ROS (+)  Hematology negative hematology ROS (+)   Anesthesia Other Findings   Reproductive/Obstetrics negative OB ROS                             Anesthesia Physical Anesthesia Plan  ASA: III  Anesthesia Plan: General   Post-op Pain Management:    Induction: Intravenous  PONV Risk Score and Plan: 2 and Ondansetron, Treatment may vary due to age or medical condition, Midazolam and Dexamethasone  Airway Management Planned: Oral ETT  Additional Equipment:   Intra-op Plan:   Post-operative Plan: Extubation in OR  Informed Consent: I have reviewed the patients History and Physical, chart, labs and discussed the procedure including the risks, benefits and alternatives for the proposed anesthesia with the patient or authorized representative who has indicated his/her understanding and acceptance.   Dental advisory given  Plan Discussed with: CRNA  Anesthesia Plan Comments:         Anesthesia Quick Evaluation

## 2018-02-08 NOTE — H&P (Signed)
Stephen Dunlap is an 47 y.o. male.   Chief Complaint: Right arm pain and weakness. HPI: 47 year old male with sudden onset of right upper extremity numbness paresthesias and weakness.  Patient has developed progressive motor loss and sensory loss in his right upper extremity predominantly consistent with a right-sided C8 radiculopathy.  Work-up demonstrates evidence of a broad-based disc herniation extending into the right sided C7-T1 foramen with compression of the right-sided C8 nerve root.  There is also significant central stenosis at this level.  The patient has a paracentral disc protrusion at C6-7 with some early spinal cord compression.  He has some early degenerative change of both C4-5 and C3-4 but no evidence of severe stenosis at these levels.  Patient presents now for 2 level anterior cervical decompression and fusion in hopes of improving his symptoms.  Past Medical History:  Diagnosis Date  . Diabetes mellitus   . Hypertension   . Morbid obesity ([place])   . Prostatitis   . Sleep apnea    CPAP machine, 4-10 pressure settings  . TIA (transient ischemic attack)    October 2018, possible?  . UTI (urinary tract infection)     Past Surgical History:  Procedure Laterality Date  . cysto bedside    . HERNIA REPAIR    . WISDOM TOOTH EXTRACTION      Family History  Problem Relation Age of Onset  . Arthritis Other   . Cancer Other        breast  . Diabetes Other   . Hypertension Other   . Hyperlipidemia Other    Social History:  reports that he has never smoked. He has never used smokeless tobacco. He reports that he drinks about 1.2 oz of alcohol per week. He reports that he does not use drugs.  Allergies:  Allergies  Allergen Reactions  . Other Anaphylaxis    Tree nuts  . Invokana [Canagliflozin] Other (See Comments)    yeast    Medications Prior to Admission  Medication Sig Dispense Refill  . aspirin EC 81 MG tablet Take 1 tablet (81 mg total) by mouth daily. 30  tablet 0  . atorvastatin (LIPITOR) 40 MG tablet Take 1 tablet (40 mg total) by mouth daily at 6 PM. 30 tablet 0  . clonazePAM (KLONOPIN) 1 MG tablet Take 1 mg by mouth at bedtime.     . empagliflozin (JARDIANCE) 25 MG TABS tablet Take 25 mg by mouth daily.    Marland Kitchen ibuprofen (ADVIL,MOTRIN) 800 MG tablet Take 800 mg by mouth every 8 (eight) hours as needed for mild pain or moderate pain.     Marland Kitchen JANUMET 50-1000 MG tablet Take 1 tablet by mouth 2 (two) times daily with a meal.   7  . TRULICITY 1.5 TL/5.7WI SOPN Inject 1.5 mg as directed every Monday.  5  . albuterol (PROVENTIL HFA;VENTOLIN HFA) 108 (90 BASE) MCG/ACT inhaler Inhale 2 puffs into the lungs every 6 (six) hours as needed for wheezing or shortness of breath. 1 Inhaler 2  . EPINEPHrine (AUVI-Q) 0.3 mg/0.3 mL IJ SOAJ injection Inject 0.3 mg into the muscle once.    . fexofenadine (ALLEGRA) 180 MG tablet Take 1 tablet (180 mg total) by mouth daily. (Patient not taking: Reported on 01/21/2018) 90 tablet 3  . FIBER ADULT GUMMIES 2 g CHEW Chew 5-6 each by mouth daily as needed (constipation).     . fluticasone (FLONASE) 50 MCG/ACT nasal spray Place 1 spray into both nostrils daily as needed for allergies  or rhinitis.    . mometasone (NASONEX) 50 MCG/ACT nasal spray PLACE 1-2 SPRAYS INTO THE NOSE DAILY. (Patient not taking: Reported on 01/21/2018) 17 g 0  . montelukast (SINGULAIR) 10 MG tablet TAKE 1 TABLET BY MOUTH EACH NIGHT AT BEDTIME 30 tablet 0  . Multiple Vitamin (MULTIVITAMIN) tablet Take 1 tablet by mouth daily.      Marland Kitchen nystatin cream (MYCOSTATIN) Apply 1 application topically 2 (two) times daily. (Patient not taking: Reported on 01/21/2018) 30 g 0  . olopatadine (PATANOL) 0.1 % ophthalmic solution INSTILL 1 DROP INTO BOTH EYES DAILY (Patient taking differently: INSTILL 1 DROP INTO BOTH EYES DAILY AS NEEDED FOR ALLERGIES) 5 mL 0  . Olopatadine HCl (PAZEO) 0.7 % SOLN Place 1 drop into both eyes 1 day or 1 dose. (Patient not taking: Reported on  01/21/2018) 1 Bottle 5  . omeprazole (PRILOSEC) 20 MG capsule Take 1 capsule (20 mg total) by mouth daily. (Patient taking differently: Take 20 mg by mouth daily as needed (acid reflux). ) 90 capsule 3    Results for orders placed or performed during the hospital encounter of 02/08/18 (from the past 48 hour(s))  Glucose, capillary     Status: Abnormal   Collection Time: 02/08/18  8:39 AM  Result Value Ref Range   Glucose-Capillary 141 (H) 70 - 99 mg/dL   No results found.  Pertinent items noted in HPI and remainder of comprehensive ROS otherwise negative.  Blood pressure (!) 139/92, pulse 89, temperature 98.7 F (37.1 C), temperature source Oral, resp. rate 20, height 6\' 5"  (1.956 m), weight (!) 180.1 kg (397 lb), SpO2 100 %.  Patient is awake and alert.  He is oriented and appropriate.  Speech is fluent.  Judgment and insight are intact.  Examination of his cranial nerve function is normal bilateral.  Motor examination of the extremities reveals mild weakness of his right-sided triceps muscle group.  He has severe weakness of his right sided intrinsics and grips.  He has some mild weakness of his left intrinsics.  He has no lower extremity dysfunction.  Sensory examination with decreased sensation pinprick light touch in his right C7 and C8 dermatomes.  Deep tender mixes are normal active.  No evidence of long track signs.  Gait is normal.  Posture is normal.  Patient is morbidly obese.  Examination head ears eyes and throat is unremarkable her chest and abdomen are benign.  Extremities are free from injury deformity. Assessment/Plan C6-7 herniated nucleus pulposis with cervical stenosis and cord compression.  C7-T1 herniated nucleus pulposus with severe spinal stenosis and right C8 radiculopathy.  Plan C6-7 and C7-T1 anterior cervical discectomy with interbody fusion utilizing interbody peek cages, locally harvested autograft, and anterior plate instrumentation.  Risks and benefits of been  explained.  Patient wishes to proceed.  Mallie Mussel A Jonathon Castelo 02/08/2018, 10:17 AM

## 2018-02-08 NOTE — Progress Notes (Signed)
Orthopedic Tech Progress Note Patient Details:  Stephen Dunlap 1971-06-06 [id number]  Patient ID: Stephen Dunlap, male   DOB: 1970/09/16, 47 y.o.   MRN: [id number]  Ortho visit and ortho charges to be deleted because bio-tech provided soft cervical collar Sharlet Salina, Eames Dibiasio 02/08/2018, 3:25 PM

## 2018-02-08 NOTE — Brief Op Note (Signed)
02/08/2018  1:42 PM  PATIENT:  Stephen Dunlap  47 y.o. male  PRE-OPERATIVE DIAGNOSIS:  Stenosis  POST-OPERATIVE DIAGNOSIS:  Stenosis  PROCEDURE:  Procedure(s): Anterior Cervical Decompression Fusion - Cervical Six-Cervical Seven - Cervical Seven -Thoracic One (N/A)  SURGEON:  Surgeon(s) and Role:    * Earnie Larsson, MD - Primary  PHYSICIAN ASSISTANT:   ASSISTANTS: Cram   ANESTHESIA:   general  EBL:  100 mL   BLOOD ADMINISTERED:none  DRAINS: none   LOCAL MEDICATIONS USED:  NONE  SPECIMEN:  No Specimen  DISPOSITION OF SPECIMEN:  N/A  COUNTS:  YES  TOURNIQUET:  * No tourniquets in log *  DICTATION: .Dragon Dictation  PLAN OF CARE: Admit to inpatient   PATIENT DISPOSITION:  PACU - hemodynamically stable.   Delay start of Pharmacological VTE agent (>24hrs) due to surgical blood loss or risk of bleeding: yes

## 2018-02-08 NOTE — Progress Notes (Signed)
Notified Dr. Marcell Barlow that pt recently had labs drawn at his PCP on 01/21/18. Pt was a rescheduled surgery so labs are out of the 2 week window. At this time, per Dr. Marcell Barlow stated no re-draw of labs are needed.   Jacqlyn Larsen, RN

## 2018-02-08 NOTE — Transfer of Care (Signed)
Immediate Anesthesia Transfer of Care Note  Patient: Stephen Dunlap  Procedure(s) Performed: Anterior Cervical Decompression Fusion - Cervical Six-Cervical Seven - Cervical Seven -Thoracic One (N/A Spine Cervical)  Patient Location: PACU  Anesthesia Type:General  Level of Consciousness: awake and patient cooperative  Airway & Oxygen Therapy: Patient Spontanous Breathing and Patient connected to face mask oxygen  Post-op Assessment: Report given to RN and Post -op Vital signs reviewed and stable  Post vital signs: Reviewed and stable  Last Vitals:  Vitals Value Taken Time  BP 138/95 02/08/2018  1:54 PM  Temp    Pulse 91 02/08/2018  1:56 PM  Resp 13 02/08/2018  1:55 PM  SpO2 96 % 02/08/2018  1:56 PM  Vitals shown include unvalidated device data.  Last Pain:  Vitals:   02/08/18 0933  TempSrc:   PainSc: 8       Patients Stated Pain Goal: 3 (61/60/73 7106)  Complications: No apparent anesthesia complications

## 2018-02-08 NOTE — Anesthesia Procedure Notes (Signed)
Procedure Name: Intubation Date/Time: 02/08/2018 11:08 AM Performed by: Valda Favia, CRNA Pre-anesthesia Checklist: Patient identified, Emergency Drugs available, Suction available and Patient being monitored Patient Re-evaluated:Patient Re-evaluated prior to induction Oxygen Delivery Method: Circle System Utilized Preoxygenation: Pre-oxygenation with 100% oxygen Induction Type: IV induction Ventilation: Oral airway inserted - appropriate to patient size and Two handed mask ventilation required Laryngoscope Size: Mac and 4 Grade View: Grade I Tube type: Oral Tube size: 7.0 mm Number of attempts: 1 Airway Equipment and Method: Stylet and Oral airway Placement Confirmation: ETT inserted through vocal cords under direct vision,  positive ETCO2 and breath sounds checked- equal and bilateral Secured at: 24 cm Tube secured with: Tape Dental Injury: Teeth and Oropharynx as per pre-operative assessment  Comments: Ramped patient, Head elevated laryngoscopy position.

## 2018-02-08 NOTE — Progress Notes (Signed)
Orthopedic Tech Progress Note Patient Details:  Stephen Dunlap [birth year] [id number]  Ortho Devices Type of Ortho Device: Soft collar Ortho Device/Splint Location: neck Ortho Device/Splint Interventions: Application   Post Interventions Patient Tolerated: Well Instructions Provided: Care of device   Hildred Priest 02/08/2018, 2:45 PM

## 2018-02-08 NOTE — Op Note (Signed)
Date of procedure: 02/08/2018  Date of dictation: Same  Service: Neurosurgery  Preoperative diagnosis: C6-7, C7-T1 herniated nucleus pulposus with stenosis and radiculopathy  Postoperative diagnosis: Same  Procedure Name: C6-7, C7-T1 anterior cervical discectomy with interbody fusion utilizing interbody peek cages, locally harvested autograft, and anterior plate instrumentation  Surgeon:Odesser Tourangeau A.Taffie Eckmann, M.D.  Asst. Surgeon: Saintclair Halsted  Anesthesia: General  Indication: 47 year old male with profound right upper extremity hand/intrinsic weakness.  Work-up demonstrates evidence of a broad-based disc herniation at C7-T1 with a foraminal disc herniation causing marked compression of the right C8 nerve root.  Patient also with a paracentral disc herniation at C6-7 with significant cord compression.  Patient presents now for 2 level anterior cervical decompression and fusion in hopes of improving his symptoms.  Situation complicated by morbid obesity.  Operative note: After induction anesthesia, patient position supine with Extended held place holder traction.  Patient's anterior cervical region prepped and draped sterilely.  Incision made overlying C7.  Dissection performed on the left side.  Retractor placed.  Fluoroscopy used.  And the C6-7 and C7-T1 levels were identified.  The spaces were then incised.  Anterior osteophytes were removed.  Discectomy was then performed using various instruments down to level the posterior annulus.  Microscope was then brought to the field used throughout the remainder of the discectomy.  Remaining aspects of annulus and osteophytes removed using high-speed drill down to level the posterior longitudinal ligament.  Posterior logical is not elevated and resected piecemeal fashion.  Underlying thecal sac was then identified.  Wide central decompression was then performed undercutting the bodies of C7 and T1.  Decompression then proceeded to each neural foramen with wide anterior  foraminotomies performed on the course exiting C8 nerve roots bilaterally including removal of all disc herniation that was visible on the right side at C7-T1.  At this point a very thorough decompression had been achieved.  There was no evidence of injury to the thecal sac or nerve roots.  Procedure was then repeated at C6-7 again without complications.  Wound is then prepared for interbody fusion.  [employer]onic anatomic peek cages packed with locally harvested autograft were then impacted in the place of both levels.  Each cage was recessed slightly from the anterior cortical margin.  [employer]onic anterior cervical plate was then placed over the C6-C7 and T1 levels.  This then attached under fluoroscopic guidance using 13 mm variable angle screws to each at all 3 levels.  All screws given final tightening found to be solidly within bone.  Locking screws were engaged in all 3 levels.  Final images were limited secondary to patient size but appeared to show good position of the cages and the hardware at the proper operative level.  Wound was then irrigated one final time.  Hemostasis was assured.  Wound was then closed in a typical fashion.  Steri-Strips and sterile dressing were applied.  No apparent complications.  Patient tolerated the procedure well and he returns to the recovery room postop.

## 2018-02-09 ENCOUNTER — Other Ambulatory Visit: Payer: Self-pay | Admitting: Allergy and Immunology

## 2018-02-09 ENCOUNTER — Encounter (HOSPITAL_COMMUNITY): Payer: Self-pay | Admitting: Neurosurgery

## 2018-02-09 DIAGNOSIS — M50123 Cervical disc disorder at C6-C7 level with radiculopathy: Secondary | ICD-10-CM | POA: Diagnosis not present

## 2018-02-09 DIAGNOSIS — M5410 Radiculopathy, site unspecified: Secondary | ICD-10-CM | POA: Diagnosis not present

## 2018-02-09 DIAGNOSIS — G4733 Obstructive sleep apnea (adult) (pediatric): Secondary | ICD-10-CM | POA: Diagnosis not present

## 2018-02-09 LAB — GLUCOSE, CAPILLARY
GLUCOSE-CAPILLARY: 182 mg/dL — AB (ref 70–99)
GLUCOSE-CAPILLARY: 166 mg/dL — AB (ref 70–99)

## 2018-02-09 MED ORDER — HYDROCODONE-ACETAMINOPHEN 5-325 MG PO TABS: 1.0000 | ORAL_TABLET | ORAL | 0 refills | Status: AC | PRN

## 2018-02-09 MED ORDER — CYCLOBENZAPRINE HCL 10 MG PO TABS: 10.0000 mg | ORAL_TABLET | Freq: Three times a day (TID) | ORAL | 0 refills | Status: DC | PRN

## 2018-02-09 MED FILL — clonazePAM 1 MG TABS: 1 | 30 days supply | Qty: 30 | Fill #0

## 2018-02-09 MED FILL — CYCLOBENZAPRINE HCL 10 MG T: 10 | 10 days supply | Qty: 30 | Fill #0

## 2018-02-09 MED FILL — JARDIANCE 25 MG TABLET: 25 | 30 days supply | Qty: 30 | Fill #1

## 2018-02-09 MED FILL — TRULICITY 1.5 MG/0.5 ML PEN: 1.5 | 28 days supply | Qty: 2 | Fill #2

## 2018-02-09 MED FILL — HYDROCODON-APAP 5-325: 5-325 | 7 days supply | Qty: 50 | Fill #0

## 2018-02-09 MED FILL — JANUMET 50-1,000 MG TABLET: 50-1000 | 30 days supply | Qty: 60 | Fill #2

## 2018-02-09 NOTE — Discharge Summary (Signed)
Physician Discharge Summary  Patient ID: Stephen Dunlap MRN: [id number] DOB/AGE: 1971-05-05 47 y.o.  Admit date: 02/08/2018 Discharge date: 02/09/2018  Admission Diagnoses:  Discharge Diagnoses:  Active Problems:   Cervical radiculopathy   Discharged Condition: good  Hospital Course: Patient admitted to the hospital to undergo 2 level anterior cervical decompression and fusion.  Postoperatively he is done very well.  Preoperative neck pain numbness and weakness much improved.  Ambulating without difficulty.  Ready for discharge home. Consults:   Significant Diagnostic Studies:   Treatments:   Discharge Exam: Blood pressure 122/79, pulse 97, temperature 98.9 F (37.2 C), temperature source Oral, resp. rate (!) 8, height 6\' 5"  (1.956 m), weight (!) 180.1 kg (397 lb), SpO2 95 %. Awake and alert.  Oriented and appropriate.  Cranial nerve function intact.  Motor and sensory function improved.  Still with some intrinsic weakness in his right hand now grading out at 4/5 still with some C8 numbness but also improved.  Wound clean and dry.  Chest and abdomen benign.  Disposition: Discharge disposition: 01-Home or Self Care        Allergies as of 02/09/2018      Reactions   Other Anaphylaxis   Tree nuts   Invokana [canagliflozin] Other (See Comments)   yeast      Medication List    TAKE these medications   albuterol 108 (90 Base) MCG/ACT inhaler Commonly known as:  PROVENTIL HFA;VENTOLIN HFA Inhale 2 puffs into the lungs every 6 (six) hours as needed for wheezing or shortness of breath.   aspirin EC 81 MG tablet Take 1 tablet (81 mg total) by mouth daily.   atorvastatin 40 MG tablet Commonly known as:  LIPITOR Take 1 tablet (40 mg total) by mouth daily at 6 PM.   AUVI-Q 0.3 mg/0.3 mL Soaj injection Generic drug:  EPINEPHrine Inject 0.3 mg into the muscle once.   clonazePAM 1 MG tablet Commonly known as:  KLONOPIN Take 1 mg by mouth at bedtime.   cyclobenzaprine 10  MG tablet Commonly known as:  FLEXERIL Take 1 tablet (10 mg total) by mouth 3 (three) times daily as needed for muscle spasms.   FIBER ADULT GUMMIES 2 g Chew Chew 5-6 each by mouth daily as needed (constipation).   fluticasone 50 MCG/ACT nasal spray Commonly known as:  FLONASE Place 1 spray into both nostrils daily as needed for allergies or rhinitis.   HYDROcodone-acetaminophen 5-325 MG tablet Commonly known as:  NORCO/VICODIN Take 1-2 tablets by mouth every 4 (four) hours as needed for moderate pain ((score 4 to 6)).   ibuprofen 800 MG tablet Commonly known as:  ADVIL,MOTRIN Take 800 mg by mouth every 8 (eight) hours as needed for mild pain or moderate pain.   JANUMET 50-1000 MG tablet Generic drug:  sitaGLIPtin-metformin Take 1 tablet by mouth 2 (two) times daily with a meal.   JARDIANCE 25 MG Tabs tablet Generic drug:  empagliflozin Take 25 mg by mouth daily.   montelukast 10 MG tablet Commonly known as:  SINGULAIR TAKE 1 TABLET BY MOUTH EACH NIGHT AT BEDTIME   multivitamin tablet Take 1 tablet by mouth daily.   olopatadine 0.1 % ophthalmic solution Commonly known as:  PATANOL INSTILL 1 DROP INTO BOTH EYES DAILY What changed:  See the new instructions.   omeprazole 20 MG capsule Commonly known as:  PRILOSEC Take 1 capsule (20 mg total) by mouth daily. What changed:    when to take this  reasons to take this  TRULICITY 1.5 PY/0.9XI Sopn Generic drug:  Dulaglutide Inject 1.5 mg as directed every Monday.        Signed: Cooper Render Perris Tripathi 02/09/2018, 8:01 AM

## 2018-02-09 NOTE — Anesthesia Postprocedure Evaluation (Signed)
Anesthesia Post Note  Patient: Stephen Dunlap  Procedure(s) Performed: Anterior Cervical Decompression Fusion - Cervical Six-Cervical Seven - Cervical Seven -Thoracic One (N/A Spine Cervical)     Patient location during evaluation: PACU Anesthesia Type: General Level of consciousness: awake and alert Pain management: pain level controlled Vital Signs Assessment: post-procedure vital signs reviewed and stable Respiratory status: spontaneous breathing, nonlabored ventilation, respiratory function stable and patient connected to nasal cannula oxygen Cardiovascular status: blood pressure returned to baseline and stable Postop Assessment: no apparent nausea or vomiting Anesthetic complications: no    Last Vitals:  Vitals:   02/09/18 0715 02/09/18 1204  BP: 122/79 (!) 132/95  Pulse: 97 93  Resp: (!) 8 18  Temp: 37.2 C 37.2 C  SpO2: 95% 93%    Last Pain:  Vitals:   02/09/18 1204  TempSrc: Oral  PainSc:                  [name] P Sheylin Scharnhorst

## 2018-02-09 NOTE — Evaluation (Signed)
Occupational Therapy Evaluation Patient Details Name: Stephen Dunlap MRN: [id number] DOB: 06/24/1971 Today's Date: 02/09/2018    History of Present Illness Pt is a 47 y/o male s/p C6-T1 ACDF after worsening R UE weakness and paresthesias.  PMH significant for but not limited to: HTN, obesity, TIA, UTI.    Clinical Impression   PTA patient was independent and working.  He currently requires minA with UB dressing, supervision for bathing, min guard assist for toilet transfers to low commode but supervision for toilet transfers to 3:1 commode, supervision for toileting, and supervision for LB dressing/bathing.  Patient educated on precautions, safety, mobility, ADL compensatory techniques, DME, recommendations, and brace management and wear schedule. He continues to reports numbness in R UE, but voices "improved compared to before surgery"; and has noted weakness in dominant R UE (3+/5 hand strength compared to 5/5 L hand strength). Patient will have support of spouse 24/7 at dc, recommend no further OT services at this time (but educated on requesting OP OT services if strength and coordination of R UE do not improve).  DME recommended listed below to raise toilet and use as shower chair.  OT signing off. Thank you for this referral!     Follow Up Recommendations  No OT follow up;Supervision/Assistance - 24 hour    Equipment Recommendations  3 in 1 bedside commode    Recommendations for Other Services       Precautions / Restrictions Precautions Precautions: Cervical Precaution Booklet Issued: Yes (comment) Precaution Comments: reviewed with patinet  Required Braces or Orthoses: Cervical Brace Cervical Brace: Soft collar;At all times Restrictions Weight Bearing Restrictions: No      Mobility Bed Mobility Overal bed mobility: Needs Assistance Bed Mobility: Rolling;Sidelying to Sit;Sit to Sidelying Rolling: Supervision Sidelying to sit: Supervision     Sit to sidelying:  Supervision General bed mobility comments: simulated home setup, requires supervision to ensure correct log rolling technique  Transfers Overall transfer level: Needs assistance   Transfers: Sit to/from Stand Sit to Stand: Supervision         General transfer comment: cueing for correct techniques and posture    Balance Overall balance assessment: No apparent balance deficits (not formally assessed)                                         ADL either performed or assessed with clinical judgement   ADL Overall ADL's : Needs assistance/impaired Eating/Feeding: Modified independent;Sitting   Grooming: Supervision/safety;Standing;Cueing for compensatory techniques Grooming Details (indicate cue type and reason): educated on compensatory techniques for adherance to neck precautions  Upper Body Bathing: Supervision/ safety;Sitting;Cueing for compensatory techniques   Lower Body Bathing: Supervison/ safety;Sit to/from stand;Adhering to back precautions;Cueing for compensatory techniques Lower Body Bathing Details (indicate cue type and reason): educated on compensatory techniques, completing bathing seated and having support; able to complete figure 4 technique for B feet Upper Body Dressing : Minimal assistance;Sitting;Cueing for compensatory techniques;With caregiver independent assisting Upper Body Dressing Details (indicate cue type and reason): educated on compnesatory techniques Lower Body Dressing: Supervision/safety;Sit to/from stand;Cueing for compensatory techniques Lower Body Dressing Details (indicate cue type and reason): educated on compensatory techniques, able to complete figure 4 method with no discomfort  Toilet Transfer: Min guard;Regular Toilet;Grab bars;Cueing for safety Toilet Transfer Details (indicate cue type and reason): min guard for safety due to low commode, recommended 3:1 for home use to improve adherance  to precautions and as pt does not  have grabbars to assist  Toileting- Clothing Manipulation and Hygiene: Supervision/safety;Sit to/from stand Toileting - Clothing Manipulation Details (indicate cue type and reason): educated on compensatory techniques  Tub/ Shower Transfer: Supervision/safety;Ambulation;3 in 1;Cueing for safety;Adhering to back precautions Tub/Shower Transfer Details (indicate cue type and reason): simulated tub transfer for home setup, educated on safety, technqiue and use of 3:1 for seat Functional mobility during ADLs: Supervision/safety(in room only) General ADL Comments: educated on compensatory techniques for self care and increased participation, encouarged increased functional use of R UE for all activities; patient agreeable     Vision Baseline Vision/History: Wears glasses Wears Glasses: Reading only Patient Visual Report: No change from baseline Vision Assessment?: No apparent visual deficits     Perception     Praxis      Pertinent Vitals/Pain Pain Assessment: 0-10 Pain Score: 6  Pain Location: neck Pain Descriptors / Indicators: Discomfort;Grimacing;Operative site guarding Pain Intervention(s): Monitored during session;Repositioned     Hand Dominance Right   Extremity/Trunk Assessment Upper Extremity Assessment Upper Extremity Assessment: RUE deficits/detail;LUE deficits/detail RUE Deficits / Details: WFL AROM within precautions (FF 90 shoulder), decreased strength grasp 3+/5  RUE: (limited by precautions) RUE Sensation: WNL(WFL light touch, but reports numbess (improved since sx)) RUE Coordination: decreased fine motor LUE Deficits / Details: WFL AROM within precautions (FF 90 shoulder), decreased strength grasp 5/5 LUE: (limited by precautions) LUE Sensation: WNL LUE Coordination: WNL   Lower Extremity Assessment Lower Extremity Assessment: Overall WFL for tasks assessed   Cervical / Trunk Assessment Cervical / Trunk Assessment: Other exceptions Cervical / Trunk  Exceptions: s/p ACDF   Communication Communication Communication: No difficulties   Cognition Arousal/Alertness: Lethargic Behavior During Therapy: WFL for tasks assessed/performed Overall Cognitive Status: Within Functional Limits for tasks assessed                                     General Comments       Exercises     Shoulder Instructions      Home Living Family/patient expects to be discharged to:: Private residence Living Arrangements: Spouse/significant other;Children Available Help at Discharge: Family Type of Home: House Home Access: Stairs to enter Technical brewer of Steps: 1   Home Layout: Two level Alternate Level Stairs-Number of Steps: 14 Alternate Level Stairs-Rails: Right;Left Bathroom Shower/Tub: Teacher, early years/pre: Standard     Home Equipment: None          Prior Functioning/Environment Level of Independence: Independent        Comments: independent and working        OT Problem List: Decreased strength;Decreased range of motion;Decreased activity tolerance;Decreased coordination;Decreased knowledge of precautions;Pain;Obesity      OT Treatment/Interventions:      OT Goals(Current goals can be found in the care plan section) Acute Rehab OT Goals Patient Stated Goal: home today OT Goal Formulation: With patient/family  OT Frequency:     Barriers to D/C:            Co-evaluation              AM-PAC PT "6 Clicks" Daily Activity     Outcome Measure Help from another person eating meals?: None Help from another person taking care of personal grooming?: A Little Help from another person toileting, which includes using toliet, bedpan, or urinal?: A Little Help from another person bathing (including washing,  rinsing, drying)?: A Little Help from another person to put on and taking off regular upper body clothing?: A Little Help from another person to put on and taking off regular lower body  clothing?: None 6 Click Score: 20   End of Session Equipment Utilized During Treatment: Gait belt;Cervical collar Nurse Communication: Mobility status(equipment at dc)  Activity Tolerance: Patient tolerated treatment well Patient left: in bed;with call bell/phone within reach;with family/visitor present  OT Visit Diagnosis: Other abnormalities of gait and mobility (R26.89);Muscle weakness (generalized) (M62.81);Pain Pain - part of body: (neck)                Time: 0223-3612 OT Time Calculation (min): 28 min Charges:  OT General Charges $OT Visit: 1 Visit OT Evaluation $OT Eval Moderate Complexity: 1 Mod OT Treatments $Self Care/Home Management : 8-22 mins G-Codes:     Delight Stare, OTR/L  Pager [phone]   Delight Stare 02/09/2018, 10:06 AM

## 2018-02-09 NOTE — Discharge Instructions (Signed)

## 2018-02-09 NOTE — Progress Notes (Signed)
Pt A/Ox4, was seen by neuro surgeon earlier on the day.  Discharge instructions and prescriptions provided.  Pt has been ambulating and voiding well.  Pt's pain well-controlled with his prn pain medication.  Pt was discharged home in stable condition, in no s/s apparent distress noted.  Pt left unit via wheelchair; pt's personal belongings sent with patient.

## 2018-02-10 MED FILL — LYRICA 50 MG CAPSULE: 50 | 30 days supply | Qty: 60 | Fill #0

## 2018-02-11 ENCOUNTER — Other Ambulatory Visit: Payer: Self-pay | Admitting: *Deleted

## 2018-02-11 DIAGNOSIS — G4733 Obstructive sleep apnea (adult) (pediatric): Secondary | ICD-10-CM | POA: Diagnosis not present

## 2018-02-11 NOTE — Patient Outreach (Signed)
[place] [facility]) Care Management  02/11/2018  Stephen Dunlap September 10, 1970 [id number]  Subjective: Telephone call to patient's home number, no answer, left HIPAA compliant voicemail message, and requested call back.    Objective:Per KPN (Knowledge Performance Now, point of care tool) and chart review,Patient hospitalized 02/08/18 - 02/09/18 for Cervical radiculopathy, status post C6-7, C7-T1 anterior cervical discectomy with interbody fusion utilizing interbody peek cages, locally harvested autograft, and anterior plate instrumentation on 02/08/18, at [facility]. Patient also has a history of OSA, hyperlipidemia, hypertension, diabetes, and diabetic neuropathy.      Assessment: Received UMR Preoperative / Transition of care referral on 6/27/419. Preoperative call completed, and Transition of care follow up pending patient contact.      Plan:RNCM will send unsuccessful outreach  letter, [facility] pamphlet, will call patient for 2nd telephone outreach attempt, transition of care follow up, and proceed with case closure, within 10 business days if no return call.       Ndidi Nesby H. Annia Friendly, BSN, [place] Management [facility] Telephonic CM Phone: [phone] Fax: [phone]

## 2018-02-12 ENCOUNTER — Other Ambulatory Visit: Payer: Self-pay | Admitting: *Deleted

## 2018-02-12 NOTE — Patient Outreach (Signed)
[facility]) Care Management  02/12/2018  Stephen Dunlap 04/15/1971 [id number]   Subjective: Telephone call to patient's home number, no answer, left HIPAA compliant voicemail message, and requested call back.    Objective:Per KPN (Knowledge Performance Now, point of care tool) and chart review,Patient hospitalized 02/08/18 - 02/09/18 for Cervical radiculopathy, status post C6-7, C7-T1 anterior cervical discectomy with interbody fusion utilizing interbody peek cages, locally harvested autograft, and anterior plate instrumentation on 02/08/18, at [facility]. Patient also has a history of OSA, hyperlipidemia, hypertension, diabetes, and diabetic neuropathy.      Assessment: Received UMR Preoperative / Transition of care referral on 6/27/419.Preoperative call completed, and Transition of care follow up pending patient contact.      Plan:RNCM has sent unsuccessful outreach  letter, [facility] pamphlet, will call patient for 3rd telephone outreach attempt, transition of care follow up, and proceed with case closure, within 10 business days if no return call.      Tregan Read H. Annia Friendly, BSN, [place] Management [facility] Telephonic CM Phone: [phone] Fax: [phone]

## 2018-02-21 ENCOUNTER — Encounter: Payer: Self-pay | Admitting: Neurology

## 2018-02-23 ENCOUNTER — Other Ambulatory Visit: Payer: Self-pay | Admitting: *Deleted

## 2018-02-23 ENCOUNTER — Encounter: Payer: Self-pay | Admitting: *Deleted

## 2018-02-23 NOTE — Patient Outreach (Signed)
[place] [facility]) Care Management  02/23/2018  Stephen Dunlap 12/24/1970 [id number]   Subjective: Telephone call to patient's mobile number, spoke with patient, and HIPAA verified.  Discussed [facility] Care Management UMR Transition of care follow up, patient voiced understanding, and is in agreement to follow up.  Patient states he just took some pain medication, is waiting for it to take effect, and can only talk for a few minutes.  States he has a follow up appointment with primary MD and surgeon in the near future, unable to give the specific date at this time, and does not have access to his calendar at this time.  States the surgery went well.  Patient states he is able to manage self care and has assistance as needed with activities of daily living / home management.  Patient voices understanding of medical diagnosis, surgery, treatment plan, and does not have hypertension.   States he was only put on blood pressure medication to protect his kidney function.  [facility] benefits discussed on 01/26/18 preoperative call and patient states no additional questions at this time. States he is participating in the [facility] Diabetes management program and has family medical leave act Ecologist ) in place.  Patient states he does not have any education material, transition of care, care coordination, disease management, disease monitoring, transportation, community resource, or pharmacy needs at this time. States he is very appreciative of the follow up and is in agreement to receive [place] Management information.     Objective:Per KPN (Knowledge Performance Now, point of care tool) and chart review,Patient hospitalized7/15/19- 7/16/19forCervical radiculopathy, status postC6-7, C7-T1 anterior cervical discectomy with interbody fusion utilizing interbody peek cages, locally harvested autograft, and anterior plate instrumentationon 02/08/18, at [facility]. Patient also has a history of OSA,  hyperlipidemia, hypertension, diabetes, and diabetic neuropathy.      Assessment: Received UMR Preoperative / Transition of care referral on 6/27/419.Preoperative call completed, and Transition of care follow up completed, no care management needs, and will proceed with case closure.      Plan:RNCM will send patient successful outreach letter, [facility] pamphlet, and magnet. RNCM will complete case closure due to follow up completed / no care management needs.        Stephen Dunlap, BSN, [place] Management [facility] Telephonic CM Phone: [phone] Fax: [phone]

## 2018-03-05 DIAGNOSIS — R310 Gross hematuria: Secondary | ICD-10-CM | POA: Diagnosis not present

## 2018-03-05 DIAGNOSIS — R319 Hematuria, unspecified: Secondary | ICD-10-CM | POA: Diagnosis not present

## 2018-03-05 MED FILL — TRULICITY 1.5 MG/0.5 ML PEN: 1.5 | 28 days supply | Qty: 2 | Fill #3

## 2018-03-05 MED FILL — ACCU-CHEK GUIDE TEST STRIP: 50 days supply | Qty: 100 | Fill #1

## 2018-03-05 MED FILL — JANUMET 50-1,000 MG TABLET: 50-1000 | 30 days supply | Qty: 60 | Fill #3

## 2018-03-05 MED FILL — JARDIANCE 25 MG TABLET: 25 | 30 days supply | Qty: 30 | Fill #2

## 2018-03-08 DIAGNOSIS — G4733 Obstructive sleep apnea (adult) (pediatric): Secondary | ICD-10-CM | POA: Diagnosis not present

## 2018-03-11 DIAGNOSIS — M5412 Radiculopathy, cervical region: Secondary | ICD-10-CM | POA: Diagnosis not present

## 2018-03-19 DIAGNOSIS — G4733 Obstructive sleep apnea (adult) (pediatric): Secondary | ICD-10-CM | POA: Diagnosis not present

## 2018-03-19 DIAGNOSIS — E1149 Type 2 diabetes mellitus with other diabetic neurological complication: Secondary | ICD-10-CM | POA: Diagnosis not present

## 2018-03-19 DIAGNOSIS — E1142 Type 2 diabetes mellitus with diabetic polyneuropathy: Secondary | ICD-10-CM | POA: Diagnosis not present

## 2018-03-30 ENCOUNTER — Encounter: Payer: Self-pay | Admitting: Neurology

## 2018-03-31 ENCOUNTER — Telehealth: Payer: Self-pay | Admitting: *Deleted

## 2018-03-31 NOTE — Telephone Encounter (Signed)
Please advise patient that he should keep his appt for sleep apnea recheck, since he has been set up with autoPAP in June and needs to be seen within the first 90 days.

## 2018-03-31 NOTE — Telephone Encounter (Signed)
Patient is scheduled for an appt. Tomorrow but not sure he needs it.  He had spinal fusion surgery and has not been able to use the auto pap machine.  States insurance company doesn't seem to understand why he was not able to use it and canceled it?   Does he need to keep this appt?  Please call to discuss.

## 2018-03-31 NOTE — Telephone Encounter (Signed)
I called pt to discuss. Pt is agreeable to coming in to his appt tomorrow to discuss his auto pap use. Pt says that he hasn't been able to use it because of his recent surgery and his insurance says they will take it away. Pt reports that he will keep his appt tomorrow. I reminded him of his appt date and time. Pt verbalized understanding.

## 2018-04-01 ENCOUNTER — Encounter: Payer: Self-pay | Admitting: Neurology

## 2018-04-01 ENCOUNTER — Ambulatory Visit (INDEPENDENT_AMBULATORY_CARE_PROVIDER_SITE_OTHER): Payer: 59 | Admitting: Neurology

## 2018-04-01 VITALS — BP 180/100 | HR 96 | Ht 78.0 in | Wt >= 6400 oz

## 2018-04-01 DIAGNOSIS — Z9989 Dependence on other enabling machines and devices: Secondary | ICD-10-CM | POA: Diagnosis not present

## 2018-04-01 DIAGNOSIS — G4733 Obstructive sleep apnea (adult) (pediatric): Secondary | ICD-10-CM

## 2018-04-01 NOTE — Patient Instructions (Addendum)
Please get in touch with [facility] about your autoPAP machine, you may be able to purchase it out of pocket. Please talk to [facility] about your problem with the power cord.   Your autoPAP worked well for you and treats your sleep apnea very well.  Please continue using your autoPAP regularly. While your insurance requires that you use PAP at least 4 hours each night on 70% of the nights, I recommend, that you not skip any nights and use it throughout the night if you can. Getting used to PAP and staying with the treatment long term does take time and patience and discipline. Untreated obstructive sleep apnea when it is moderate to severe can have an adverse impact on cardiovascular health and raise her risk for heart disease, arrhythmias, hypertension, congestive heart failure, stroke and diabetes. Untreated obstructive sleep apnea causes sleep disruption, nonrestorative sleep, and sleep deprivation. This can have an impact on your day to day functioning and cause daytime sleepiness and impairment of cognitive function, memory loss, mood disturbance, and problems focussing. Using PAP regularly can improve these symptoms.  We can try to switch DME companies. Please keep in mind, that we may have to redo your home sleep test.

## 2018-04-01 NOTE — Progress Notes (Signed)
Subjective:    Patient ID: Stephen Dunlap is a 47 y.o. male.  HPI     Interim history:   Stephen Dunlap is a 16 year old right-handed gentleman with an underlying medical history of type 2 diabetes, diabetic neuropathy, hypertension, fatty liver, reflux disease, morbid obesity with a BMI of over 45, history of depression, hyperlipidemia and recent hospital admission in October 2018 for concern for TIA with transient left facial droop and vision change, who presents for follow-up consultation of his severe obstructive sleep apnea. The patient is accompanied by his wife today. I last saw Stephen Dunlap on 12/28/2017, at which time we talked about his home sleep test results. Stephen Dunlap was requesting resubmission for an attended sleep study. I resubmitted request for sleep study testing but unfortunately was denied by his insurance and Stephen Dunlap was willing to consider AutoPap therapy at home. We [facility] this through [facility].  Today, 04/01/2018: I reviewed his AutoPap compliance data from 01/23/2018 through 02/21/2018 which is a total of 30 days during which time Stephen Dunlap used his AutoPap 18 days with percent used days greater than 4 hours at 57%, indicating suboptimal compliance. Stephen Dunlap has had ongoing 2 days of usage in the past 30 days. Stephen Dunlap called in the interim reporting that Stephen Dunlap had not been able to use his AutoPap because of recent neck surgery. Stephen Dunlap had recent cervical ACDF in July 2019. Stephen Dunlap has not been able to use his AutoPap consistently since his setup date which was 01/06/2018. In the past 84 days since set up on 01/06/2018 Stephen Dunlap has used his AutoPap 31 out of 84 days and percent used days greater than 4 hours was suboptimal unfortunately at 25% only. Residual AHI generally below 5 per hour, with the most consistent usage in late June through late July residual AHI was 2.2 per hour, leak on the low side, 95th percentile pressure between 10.3 cm and 10.8 cm. Stephen Dunlap reports that when Stephen Dunlap was able to use his AutoPap Stephen Dunlap actually felt better,  Stephen Dunlap was better rested and his wife endorses that Stephen Dunlap slept quite well with it. In June Stephen Dunlap started having radiating neck pain to the right arm and numbness in his fingers, Stephen Dunlap was found to have significant neck disease and required surgery in July. Stephen Dunlap has been recuperating from this. Initially after the surgery Stephen Dunlap had significant neck pain and difficulty moving. Stephen Dunlap also reports that lately his power cord from the machine has been wiggly and the machine loses power as the cord comes off. Stephen Dunlap would be willing to continue with AutoPap as Stephen Dunlap also noticed an improvement in his sleep quality and daytime symptoms. Stephen Dunlap is motivated to get back on track with his AutoPap machine. Stephen Dunlap has not been driving yet.    The patient's allergies, current medications, family history, past medical history, past social history, past surgical history and problem list were reviewed and updated as appropriate.    Previously (copied from previous notes for reference):    I first met Stephen Dunlap on 06/09/2017 at the request of his primary care physician, at which time the patient reported a prior diagnosis of obstructive sleep apnea. Stephen Dunlap was no longer using his CPAP machine. I advised Stephen Dunlap to proceed with a sleep study testing. His insurance denied a lab attended sleep study. Stephen Dunlap had a home sleep test on 07/22/2017 which showed severe sleep apnea with an overall AHI of 43.2 per hour, average oxygen saturation of 93%, nadir of 66% with significant time below 88% or at 88%  saturation of 36.8 minutes for the night, total testing time was 7 hours and 20 minutes. Stephen Dunlap was advised to proceed with positive airway pressure treatment. Stephen Dunlap did not proceed with treatment secondary to cost.     06/09/2017: (Stephen Dunlap) was previously diagnosed with obstructive sleep apnea and placed on CPAP therapy. Stephen Dunlap had a home sleep test in May 2013 which showed an AHI of 38.5/hour, O2 nadir of 60%. Stephen Dunlap has not been using a CPAP machine for the past at least 10-years. Stephen Dunlap essentially  stopped using his CPAP when his first wife died. Stephen Dunlap has remarried since then. Stephen Dunlap has 2 children from his first marriage, ages 89 and 24. 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 Stephen Dunlap was recently admitted on 04/29/2017 and discharged on 04/30/2017. Stephen Dunlap had a left facial droop that lasted less than a minute. Stephen Dunlap had TIA workup including CT head without contrast which showed no acute intracranial pathology, MRI and MRA brain showed normal findings, Stephen Dunlap had carotid Doppler testing which showed 1-39% ICA stenosis bilaterally, hemoglobin A1c was 8.5, LDL 133. Stephen Dunlap was advised to start a baby aspirin and take a statin. I reviewed your office note from 05/06/2017. His Epworth sleepiness score is 8 out of 24, fatigue score is 34 out of 63. Stephen Dunlap lives with his wife.  Stephen Dunlap is a nonsmoker. Stephen Dunlap drinks alcohol occasionally. His bedtime varies. When Stephen Dunlap works night shift goes to bed around 8 AM and wakes up typically before noon. Stephen Dunlap takes clonazepam for sleep. Stephen Dunlap has a normal difficult time sleeping at night. Stephen Dunlap has rotating shift. His weight has been fluctuating. Stephen Dunlap reports nocturia about once per average night and occasional morning headaches.  His Past Medical History Is Significant For: Past Medical History:  Diagnosis Date  . Diabetes mellitus   . Hypertension   . Morbid obesity ([place])   . Prostatitis   . Sleep apnea    CPAP machine, 4-10 pressure settings  . TIA (transient ischemic attack)    October 2018, possible?  . UTI (urinary tract infection)     His Past Surgical History Is Significant For: Past Surgical History:  Procedure Laterality Date  . ANTERIOR CERVICAL DECOMP/DISCECTOMY FUSION N/A 02/08/2018   Procedure: Anterior Cervical Decompression Fusion - Cervical Six-Cervical Seven - Cervical Seven -Thoracic One;  Surgeon: Earnie Larsson, MD;  Location: [facility];  Service: Neurosurgery;  Laterality: N/A;  . cysto bedside    . HERNIA REPAIR    . WISDOM TOOTH EXTRACTION      His Family History Is Significant For: Family  History  Problem Relation Age of Onset  . Arthritis Other   . Cancer Other        breast  . Diabetes Other   . Hypertension Other   . Hyperlipidemia Other     His Social History Is Significant For: Social History   Socioeconomic History  . Marital status: Married    Spouse name: Not on file  . Number of children: 3  . Years of education: Not on file  . Highest education level: Not on file  Occupational History  . Occupation: Estate agent  Social Needs  . Financial resource strain: Not on file  . Food insecurity:    Worry: Not on file    Inability: Not on file  . Transportation needs:    Medical: Not on file    Non-medical: Not on file  Tobacco Use  . Smoking status: Never Smoker  . Smokeless tobacco: Never Used  Substance and Sexual  Activity  . Alcohol use: Yes    Alcohol/week: 2.0 standard drinks    Types: 2 Cans of beer per week    Comment: Socially, not weekly  . Drug use: No  . Sexual activity: Yes  Lifestyle  . Physical activity:    Days per week: Not on file    Minutes per session: Not on file  . Stress: Not on file  Relationships  . Social connections:    Talks on phone: Not on file    Gets together: Not on file    Attends religious service: Not on file    Active member of club or organization: Not on file    Attends meetings of clubs or organizations: Not on file    Relationship status: Not on file  Other Topics Concern  . Not on file  Social History Narrative  . Not on file    His Allergies Are:  Allergies  Allergen Reactions  . Other Anaphylaxis    Tree nuts  . Invokana [Canagliflozin] Other (See Comments)    yeast  :   His Current Medications Are:  Outpatient Encounter Medications as of 04/01/2018  Medication Sig  . albuterol (PROVENTIL HFA;VENTOLIN HFA) 108 (90 BASE) MCG/ACT inhaler Inhale 2 puffs into the lungs every 6 (six) hours as needed for wheezing or shortness of breath.  Marland Kitchen aspirin EC 81 MG tablet Take 1 tablet (81  mg total) by mouth daily.  Marland Kitchen atorvastatin (LIPITOR) 40 MG tablet Take 1 tablet (40 mg total) by mouth daily at 6 PM.  . clonazePAM (KLONOPIN) 1 MG tablet Take 1 mg by mouth at bedtime.   . cyclobenzaprine (FLEXERIL) 10 MG tablet Take 1 tablet (10 mg total) by mouth 3 (three) times daily as needed for muscle spasms.  . empagliflozin (JARDIANCE) 25 MG TABS tablet Take 25 mg by mouth daily.  Marland Kitchen EPINEPHrine (AUVI-Q) 0.3 mg/0.3 mL IJ SOAJ injection Inject 0.3 mg into the muscle once.  Marland Kitchen FIBER ADULT GUMMIES 2 g CHEW Chew 5-6 each by mouth daily as needed (constipation).   . fluticasone (FLONASE) 50 MCG/ACT nasal spray Place 1 spray into both nostrils daily as needed for allergies or rhinitis.  Marland Kitchen HYDROcodone-acetaminophen (NORCO/VICODIN) 5-325 MG tablet Take 1-2 tablets by mouth every 4 (four) hours as needed for moderate pain ((score 4 to 6)).  Marland Kitchen ibuprofen (ADVIL,MOTRIN) 800 MG tablet Take 800 mg by mouth every 8 (eight) hours as needed for mild pain or moderate pain.   Marland Kitchen JANUMET 50-1000 MG tablet Take 1 tablet by mouth 2 (two) times daily with a meal.   . montelukast (SINGULAIR) 10 MG tablet TAKE 1 TABLET BY MOUTH EACH NIGHT AT BEDTIME  . Multiple Vitamin (MULTIVITAMIN) tablet Take 1 tablet by mouth daily.    Marland Kitchen olopatadine (PATANOL) 0.1 % ophthalmic solution INSTILL 1 DROP INTO BOTH EYES DAILY (Patient taking differently: INSTILL 1 DROP INTO BOTH EYES DAILY AS NEEDED FOR ALLERGIES)  . omeprazole (PRILOSEC) 20 MG capsule Take 1 capsule (20 mg total) by mouth daily. (Patient taking differently: Take 20 mg by mouth daily as needed (acid reflux). )  . TRULICITY 1.5 XN/1.7GY SOPN Inject 1.5 mg as directed every Monday.   No facility-administered encounter medications on file as of 04/01/2018.   :  Review of Systems:  Out of a complete 14 point review of systems, all are reviewed and negative with the exception of these symptoms as listed below:  Review of Systems  Neurological:  Pt presents today to  discuss his auto pap. Pt recently had cervical surgery and is having a hard time using his auto pap. Pt feels better on auto pap therapy when Stephen Dunlap is able to use it. Pt's power cord for his machine was faulty and it took Stephen Dunlap several weeks to receive a new one from [facility]. Pt would like to switch DMEs.    Objective:  Neurological Exam  Physical Exam Physical Examination:   Vitals:   04/01/18 0937  BP: (!) 180/100  Pulse: 96   General Examination: The patient is a very pleasant 47 y.o. male in no acute distress. Stephen Dunlap appears well-developed and well-nourished and well groomed.   HEENT:Normocephalic, atraumatic, EOMI. decreased mobility in the neck, Stephen Dunlap is cautious with head turns. Well-healing scar anterior neck. Hearing is grossly intact. Face is symmetric with normal facial animation and normal facial sensation. Speech is clear with no dysarthria noted. There is no hypophonia. There is no lip, neck/head, jaw or voice tremor.    Chest:Clear to auscultation without wheezing, rhonchi or crackles noted.  Heart:S1+S2+0, regular and normal without murmurs, rubs or gallops noted.   Abdomen:no obvious change.   Extremities:There istrace edema in the left distal lower extremity  Skin: Warm and dry without trophic changes noted.  Musculoskeletal: exam reveals no obvious joint deformities, tenderness or joint swelling or erythema.   Neurologically:  Mental status: The patient is awake, alert and oriented.Hisimmediate and remote memory, attention, language skills and fund of knowledge are appropriate. Mood is normaland affect is normal.  Cranial nerves II - XII are as described above under HEENT exam.  Motor exam: Normal bulk. No tremor. Fine motor skills and coordination: grossly intact.  Cerebellar testing: No dysmetria or intention tremor. There is no truncal or gait ataxia.  Sensory exam: intact to light touch in the upper and lower extremities.  Gait, station and balance:Hestands  easily. No veering to one side is noted. No leaning to one side is noted. Posture is age-appropriate and stance is narrow based. Gait showsnormalstride length and normalpace. No problems turning are noted.    Assessmentand Plan:  In summary,Stephen A Burkesis a very pleasant 26 year oldmalewith an underlying medical history of type 2 diabetes,diabetic neuropathy, hypertension, fatty liver, reflux disease, morbid obesity with a BMI of over 45, history of depression, hyperlipidemia, hospitalization in October 2018 for concern for TIA with transient left facial droop and vision change at the time, status post cervical ACDF in July 2019, whopresents for follow-up consultation of his severe obstructive sleep apnea. Stephen Dunlap had a prior diagnosis of obstructive sleep apnea and was on CPAP therapy in the past. Stephen Dunlap had a home sleep test on 07/22/2017 which showed severe sleep apnea with an AHI of 43.2 per hour, average oxygen saturation of 93%, nadir of 66%. Stephen Dunlap started AutoPap therapy in June 2019. Stephen Dunlap was fairly good with his compliance in the month of June through July until his next surgery. His best compliance was 57% for more than 4 hours. Stephen Dunlap developed significant neck pain and post surgery Stephen Dunlap was dealing with difficulty sleeping and residual neck pain. Stephen Dunlap is recuperating from his neck surgery and motivated to get back on track with his AutoPap therapy. Stephen Dunlap does endorse that when Stephen Dunlap was consistently able to use his machine Stephen Dunlap felt better, including that her daytime energy, less nighttime sleep disruption and his wife had noticed significant improvement in his sleep and daytime symptoms as well. Stephen Dunlap is advised to talk to his DME company  about how to proceed with his current machine and if needed Stephen Dunlap may need to repeat his home sleep test for Stephen Dunlap qualified for treatment, as per insurance requirement. Stephen Dunlap is also advised to talk to his DME company about the difficulty with his power cord. I would like to see Stephen Dunlap back in 6  months, sooner if needed. I answered all her questions today and the patient and his wife were in agreement.is advised that Stephen Dunlap has severe sleep apnea and should not remain untreated. Stephen Dunlap is willing to proceed with AutoPap therapy. Stephen Dunlap would like to go through [facility] for this. We will resubmit the order to his DME of choice. Stephen Dunlap is advised that Stephen Dunlap will need a follow-up appointment in the next 10 weeks or so.

## 2018-04-05 DIAGNOSIS — E113292 Type 2 diabetes mellitus with mild nonproliferative diabetic retinopathy without macular edema, left eye: Secondary | ICD-10-CM | POA: Diagnosis not present

## 2018-04-05 DIAGNOSIS — E113391 Type 2 diabetes mellitus with moderate nonproliferative diabetic retinopathy without macular edema, right eye: Secondary | ICD-10-CM | POA: Diagnosis not present

## 2018-04-05 DIAGNOSIS — H2513 Age-related nuclear cataract, bilateral: Secondary | ICD-10-CM | POA: Diagnosis not present

## 2018-04-08 DIAGNOSIS — G4733 Obstructive sleep apnea (adult) (pediatric): Secondary | ICD-10-CM | POA: Diagnosis not present

## 2018-04-14 DIAGNOSIS — M5412 Radiculopathy, cervical region: Secondary | ICD-10-CM | POA: Diagnosis not present

## 2018-04-20 ENCOUNTER — Ambulatory Visit: Payer: 59 | Admitting: Nurse Practitioner

## 2018-05-23 ENCOUNTER — Emergency Department (HOSPITAL_BASED_OUTPATIENT_CLINIC_OR_DEPARTMENT_OTHER)
Admission: EM | Admit: 2018-05-23 | Discharge: 2018-05-23 | Disposition: A | Discharge status: Home / Self Care | Payer: 59 | Attending: Emergency Medicine | Admitting: Emergency Medicine

## 2018-05-23 ENCOUNTER — Encounter (HOSPITAL_BASED_OUTPATIENT_CLINIC_OR_DEPARTMENT_OTHER): Payer: Self-pay | Admitting: *Deleted

## 2018-05-23 ENCOUNTER — Other Ambulatory Visit: Payer: Self-pay

## 2018-05-23 DIAGNOSIS — Z7984 Long term (current) use of oral hypoglycemic drugs: Secondary | ICD-10-CM | POA: Diagnosis not present

## 2018-05-23 DIAGNOSIS — F329 Major depressive disorder, single episode, unspecified: Secondary | ICD-10-CM | POA: Insufficient documentation

## 2018-05-23 DIAGNOSIS — X19XXXA Contact with other heat and hot substances, initial encounter: Secondary | ICD-10-CM | POA: Diagnosis not present

## 2018-05-23 DIAGNOSIS — Z8673 Personal history of transient ischemic attack (TIA), and cerebral infarction without residual deficits: Secondary | ICD-10-CM | POA: Diagnosis not present

## 2018-05-23 DIAGNOSIS — T25031A Burn of unspecified degree of right toe(s) (nail), initial encounter: Secondary | ICD-10-CM | POA: Diagnosis present

## 2018-05-23 DIAGNOSIS — I10 Essential (primary) hypertension: Secondary | ICD-10-CM | POA: Diagnosis not present

## 2018-05-23 DIAGNOSIS — Y998 Other external cause status: Secondary | ICD-10-CM | POA: Insufficient documentation

## 2018-05-23 DIAGNOSIS — Y9259 Other trade areas as the place of occurrence of the external cause: Secondary | ICD-10-CM | POA: Diagnosis not present

## 2018-05-23 DIAGNOSIS — Y9389 Activity, other specified: Secondary | ICD-10-CM | POA: Diagnosis not present

## 2018-05-23 DIAGNOSIS — E119 Type 2 diabetes mellitus without complications: Secondary | ICD-10-CM | POA: Diagnosis not present

## 2018-05-23 DIAGNOSIS — Z79899 Other long term (current) drug therapy: Secondary | ICD-10-CM | POA: Diagnosis not present

## 2018-05-23 DIAGNOSIS — T300 Burn of unspecified body region, unspecified degree: Secondary | ICD-10-CM

## 2018-05-23 NOTE — ED Notes (Signed)
Dressing to right great toe. Xeroform and kerlix
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
## 2018-05-23 NOTE — ED Notes (Signed)
Pt understood dc material. NAD noted. 

## 2018-05-26 ENCOUNTER — Other Ambulatory Visit: Payer: Self-pay | Admitting: Allergy and Immunology

## 2018-05-26 DIAGNOSIS — T25229A Burn of second degree of unspecified foot, initial encounter: Secondary | ICD-10-CM | POA: Diagnosis not present

## 2018-05-26 DIAGNOSIS — I10 Essential (primary) hypertension: Secondary | ICD-10-CM | POA: Diagnosis not present

## 2018-05-26 DIAGNOSIS — E1149 Type 2 diabetes mellitus with other diabetic neurological complication: Secondary | ICD-10-CM | POA: Diagnosis not present

## 2018-05-26 MED FILL — SILVADENE 1% CREAM: 1 | 20 days supply | Qty: 50 | Fill #0

## 2018-05-26 MED FILL — EZETIMIBE 10 MG TABLET: 10 | 90 days supply | Qty: 90 | Fill #1

## 2018-05-26 MED FILL — ASPIRIN ADULT LOW STRENGTH: 81 | 90 days supply | Qty: 90 | Fill #2

## 2018-05-26 MED FILL — JANUMET 50-1,000 MG TABLET: 50-1000 | 30 days supply | Qty: 60 | Fill #4

## 2018-05-26 MED FILL — JARDIANCE 25 MG TABLET: 25 | 30 days supply | Qty: 30 | Fill #3

## 2018-05-26 MED FILL — TRULICITY 1.5 MG/0.5 ML PEN: 1.5 | 28 days supply | Qty: 2 | Fill #4

## 2018-05-26 MED FILL — DOXYCYCLINE HYCLATE 100 MG: 100 | 10 days supply | Qty: 20 | Fill #0

## 2018-05-26 MED FILL — ACCU-CHEK GUIDE TEST STRIP: 50 days supply | Qty: 100 | Fill #2

## 2018-05-26 MED FILL — ATORVASTATIN 40 MG TABLET: 40 | 90 days supply | Qty: 90 | Fill #2

## 2018-05-26 MED FILL — FLUCONAZOLE 150 MG TABS: 150 | 7 days supply | Qty: 2 | Fill #0

## 2018-05-27 MED FILL — LYRICA 50 MG CAPSULE: 50 | 30 days supply | Qty: 60 | Fill #0

## 2018-05-27 MED FILL — clonazePAM 1 MG TABS: 1 | 30 days supply | Qty: 30 | Fill #0

## 2018-06-09 DIAGNOSIS — E1149 Type 2 diabetes mellitus with other diabetic neurological complication: Secondary | ICD-10-CM | POA: Diagnosis not present

## 2018-06-09 DIAGNOSIS — T25299S Burn of second degree of multiple sites of unspecified ankle and foot, sequela: Secondary | ICD-10-CM | POA: Diagnosis not present

## 2018-06-09 DIAGNOSIS — E11621 Type 2 diabetes mellitus with foot ulcer: Secondary | ICD-10-CM | POA: Diagnosis not present

## 2018-06-09 MED FILL — DOXYCYCLINE HYCLATE 100 MG: 100 | 10 days supply | Qty: 20 | Fill #0

## 2018-06-10 ENCOUNTER — Other Ambulatory Visit: Payer: Self-pay | Admitting: Endocrinology

## 2018-06-14 DIAGNOSIS — E11621 Type 2 diabetes mellitus with foot ulcer: Secondary | ICD-10-CM | POA: Diagnosis not present

## 2018-06-14 DIAGNOSIS — Z23 Encounter for immunization: Secondary | ICD-10-CM | POA: Diagnosis not present

## 2018-06-14 DIAGNOSIS — T25299S Burn of second degree of multiple sites of unspecified ankle and foot, sequela: Secondary | ICD-10-CM | POA: Diagnosis not present

## 2018-06-15 DIAGNOSIS — T25221S Burn of second degree of right foot, sequela: Secondary | ICD-10-CM | POA: Diagnosis not present

## 2018-06-15 DIAGNOSIS — L089 Local infection of the skin and subcutaneous tissue, unspecified: Secondary | ICD-10-CM | POA: Diagnosis not present

## 2018-06-17 DIAGNOSIS — M5412 Radiculopathy, cervical region: Secondary | ICD-10-CM | POA: Diagnosis not present

## 2018-06-17 DIAGNOSIS — R030 Elevated blood-pressure reading, without diagnosis of hypertension: Secondary | ICD-10-CM | POA: Diagnosis not present

## 2018-06-17 DIAGNOSIS — Z6841 Body Mass Index (BMI) 40.0 and over, adult: Secondary | ICD-10-CM | POA: Diagnosis not present

## 2018-06-23 DIAGNOSIS — T25299S Burn of second degree of multiple sites of unspecified ankle and foot, sequela: Secondary | ICD-10-CM | POA: Diagnosis not present

## 2018-06-23 DIAGNOSIS — E11621 Type 2 diabetes mellitus with foot ulcer: Secondary | ICD-10-CM | POA: Diagnosis not present

## 2018-06-23 MED FILL — LYRICA 50 MG CAPSULE: 50 | 30 days supply | Qty: 60 | Fill #1

## 2018-06-23 MED FILL — clonazePAM 1 MG TABS: 1 | 30 days supply | Qty: 30 | Fill #1

## 2018-06-23 MED FILL — TRULICITY 1.5 MG/0.5 ML PEN: 1.5 | 28 days supply | Qty: 2 | Fill #5

## 2018-06-23 MED FILL — JARDIANCE 25 MG TABLET: 25 | 30 days supply | Qty: 30 | Fill #4

## 2018-06-23 MED FILL — JANUMET 50-1,000 MG TABLET: 50-1000 | 30 days supply | Qty: 60 | Fill #5

## 2018-07-29 MED FILL — TRULICITY 1.5 MG/0.5 ML PEN: 1.5 | 28 days supply | Qty: 2 | Fill #0

## 2018-07-29 MED FILL — ACCU-CHEK GUIDE TEST STRIP: 50 days supply | Qty: 100 | Fill #3

## 2018-07-29 MED FILL — JARDIANCE 25 MG TABLET: 25 | 30 days supply | Qty: 30 | Fill #0

## 2018-07-29 MED FILL — clonazePAM 1 MG TABS: 1 | 30 days supply | Qty: 30 | Fill #2

## 2018-07-29 MED FILL — PREGABALIN 50 MG CAPS: 50 | 30 days supply | Qty: 60 | Fill #2

## 2018-07-29 MED FILL — JANUMET 50-1,000 MG TABLET: 50-1000 | 30 days supply | Qty: 60 | Fill #6

## 2018-07-30 DIAGNOSIS — E139 Other specified diabetes mellitus without complications: Secondary | ICD-10-CM | POA: Diagnosis not present

## 2018-07-30 DIAGNOSIS — B353 Tinea pedis: Secondary | ICD-10-CM | POA: Diagnosis not present

## 2018-07-30 DIAGNOSIS — B351 Tinea unguium: Secondary | ICD-10-CM | POA: Diagnosis not present

## 2018-08-11 DIAGNOSIS — R310 Gross hematuria: Secondary | ICD-10-CM | POA: Diagnosis not present

## 2018-08-13 DIAGNOSIS — G4733 Obstructive sleep apnea (adult) (pediatric): Secondary | ICD-10-CM | POA: Diagnosis not present

## 2018-08-13 DIAGNOSIS — E1149 Type 2 diabetes mellitus with other diabetic neurological complication: Secondary | ICD-10-CM | POA: Diagnosis not present

## 2018-08-13 DIAGNOSIS — M5412 Radiculopathy, cervical region: Secondary | ICD-10-CM | POA: Diagnosis not present

## 2018-08-13 DIAGNOSIS — E1142 Type 2 diabetes mellitus with diabetic polyneuropathy: Secondary | ICD-10-CM | POA: Diagnosis not present

## 2018-08-13 DIAGNOSIS — I10 Essential (primary) hypertension: Secondary | ICD-10-CM | POA: Diagnosis not present

## 2018-08-13 DIAGNOSIS — F3289 Other specified depressive episodes: Secondary | ICD-10-CM | POA: Diagnosis not present

## 2018-08-13 DIAGNOSIS — E7849 Other hyperlipidemia: Secondary | ICD-10-CM | POA: Diagnosis not present

## 2018-08-13 DIAGNOSIS — E1140 Type 2 diabetes mellitus with diabetic neuropathy, unspecified: Secondary | ICD-10-CM | POA: Diagnosis not present

## 2018-08-18 DIAGNOSIS — R310 Gross hematuria: Secondary | ICD-10-CM | POA: Diagnosis not present

## 2018-08-26 MED FILL — EZETIMIBE 10 MG TABS: 10 | 90 days supply | Qty: 90 | Fill #2

## 2018-08-26 MED FILL — TRULICITY 1.5 MG/0.5 ML PEN: 1.5 | 28 days supply | Qty: 2 | Fill #1

## 2018-08-26 MED FILL — JARDIANCE 25 MG TABLET: 25 | 30 days supply | Qty: 30 | Fill #1

## 2018-08-26 MED FILL — clonazePAM 1 MG TABS: 1 | 30 days supply | Qty: 30 | Fill #3

## 2018-08-26 MED FILL — PREGABALIN 50 MG CAPS: 50 | 30 days supply | Qty: 60 | Fill #3

## 2018-08-26 MED FILL — JANUMET 50-1,000 MG TABLET: 50-1000 | 30 days supply | Qty: 60 | Fill #0

## 2018-09-24 MED FILL — JARDIANCE 25 MG TABLET: 25 | 30 days supply | Qty: 30 | Fill #2

## 2018-09-24 MED FILL — JANUMET 50-1,000 MG TABLET: 50-1000 | 30 days supply | Qty: 60 | Fill #1

## 2018-09-24 MED FILL — TRULICITY 1.5 MG/0.5 ML PEN: 1.5 | 28 days supply | Qty: 2 | Fill #2

## 2018-09-24 MED FILL — ATORVASTATIN 40 MG TABLET: 40 | 90 days supply | Qty: 90 | Fill #0

## 2018-09-28 ENCOUNTER — Telehealth: Payer: Self-pay

## 2018-09-28 NOTE — Telephone Encounter (Signed)
I called pt. He has an appt on 09/30/18 but has not been using his cpap. Pt report that his work as caused him not to be able to use it. Pt wants to reschedule. Pt was rescheduled until 12/30/2018. I reminded pt of cpap compliance expectations. Pt verbalized understanding of this and of new appt date and time.

## 2018-09-30 ENCOUNTER — Ambulatory Visit: Payer: 59 | Admitting: Neurology

## 2018-10-04 DIAGNOSIS — H40053 Ocular hypertension, bilateral: Secondary | ICD-10-CM | POA: Diagnosis not present

## 2018-10-04 DIAGNOSIS — E119 Type 2 diabetes mellitus without complications: Secondary | ICD-10-CM | POA: Diagnosis not present

## 2018-10-04 DIAGNOSIS — D3132 Benign neoplasm of left choroid: Secondary | ICD-10-CM | POA: Diagnosis not present

## 2018-10-04 DIAGNOSIS — H2513 Age-related nuclear cataract, bilateral: Secondary | ICD-10-CM | POA: Diagnosis not present

## 2018-10-04 DIAGNOSIS — H04123 Dry eye syndrome of bilateral lacrimal glands: Secondary | ICD-10-CM | POA: Diagnosis not present

## 2018-10-04 DIAGNOSIS — H10413 Chronic giant papillary conjunctivitis, bilateral: Secondary | ICD-10-CM | POA: Diagnosis not present

## 2018-10-29 MED FILL — TRULICITY 1.5 MG/0.5 ML PEN: 1.5 | 28 days supply | Qty: 2 | Fill #3

## 2018-10-29 MED FILL — JARDIANCE 25 MG TABLET: 25 | 30 days supply | Qty: 30 | Fill #3

## 2018-10-29 MED FILL — JANUMET 50-1,000 MG TABLET: 50-1000 | 30 days supply | Qty: 60 | Fill #2

## 2018-11-03 ENCOUNTER — Telehealth: Payer: Self-pay | Admitting: Neurology

## 2018-11-03 NOTE — Telephone Encounter (Signed)
Due to current COVID 19 pandemic, our office is severely reducing in office visits for at least the next 2 weeks, in order to minimize the risk to our patients and healthcare providers.   Called patient and offered a virtual visit with Dr. Rexene Alberts. Patient's original 3/5 appointment was rescheduled for June. Patient agreed to the virtual visit and scheduled for 4/15 at 2pm. I explained to him the steps he will need to take in order to participate in the visit from his device. Patient verbalized understanding. He understands he will receive an e-mail from our office with the link for connection, as well as a call from Dr. Guadelupe Sabin nurse sometime prior to appointment to update chart history.  Pt understands that although there may be some limitations with this type of visit, we will take all precautions to reduce any security or privacy concerns.  Pt understands that this will be treated like an in office visit and we will file with pt's insurance, and there may be a patient responsible charge related to this service.  Pt's email is [email or web address]. Pt understands that the [employer] webex software must be downloaded and operational on the device pt plans to use for the visit.

## 2018-11-04 NOTE — Addendum Note (Signed)
Addended by: Lester Winthrop A on: 11/04/2018 09:39 AM   Modules accepted: Orders

## 2018-11-04 NOTE — Telephone Encounter (Signed)
I called pt. Pt's meds, allergies, and PMH were updated.  Pt reports that his cpap is going well.   Pt's last weight is 403 lb and pt is 6'5.  Pt reports that he did get the email from [place] yesterday and will download the app/software prior to his appt.

## 2018-11-10 ENCOUNTER — Ambulatory Visit (INDEPENDENT_AMBULATORY_CARE_PROVIDER_SITE_OTHER): Payer: 59 | Admitting: Neurology

## 2018-11-10 ENCOUNTER — Encounter: Payer: Self-pay | Admitting: Neurology

## 2018-11-10 ENCOUNTER — Other Ambulatory Visit: Payer: Self-pay

## 2018-11-10 NOTE — Progress Notes (Signed)
Patient did not make it to the Webex appt. We will reschedule.

## 2018-11-10 NOTE — Patient Instructions (Signed)
Given verbally, during today's virtual video-based encounter, with verbal feedback received.   

## 2018-11-12 IMAGING — RF DG CERVICAL SPINE 1V
1 series · 2 of 2 positions shown · non-contrast
Comparison: Cervical spine MRI 01/18/2018.

CLINICAL DATA: 47-year-old male undergoing cervical spine surgery.

EXAM:
DG C-ARM 61-120 MIN; DG CERVICAL SPINE - 1 VIEW

[Series 1: run · 2 of 2 slices shown]
[im 1/2]
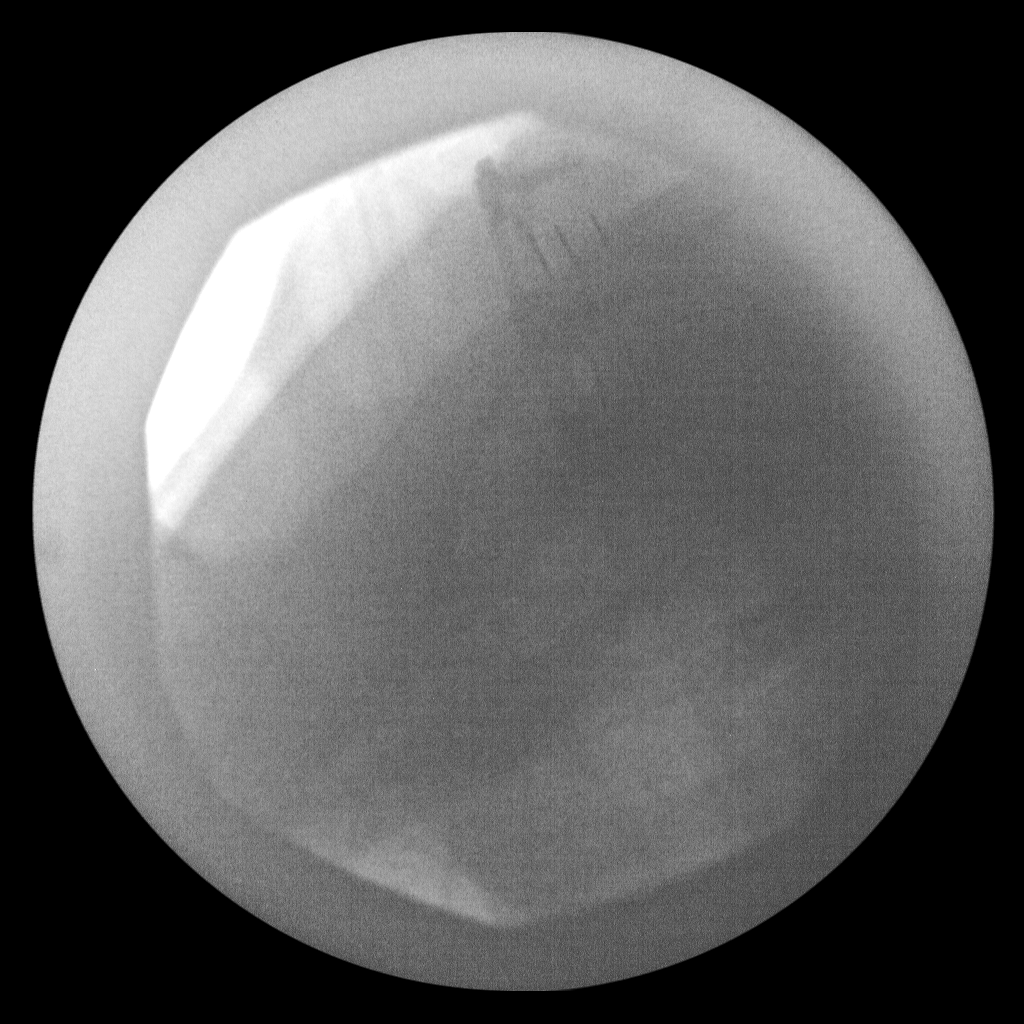
[im 2/2]
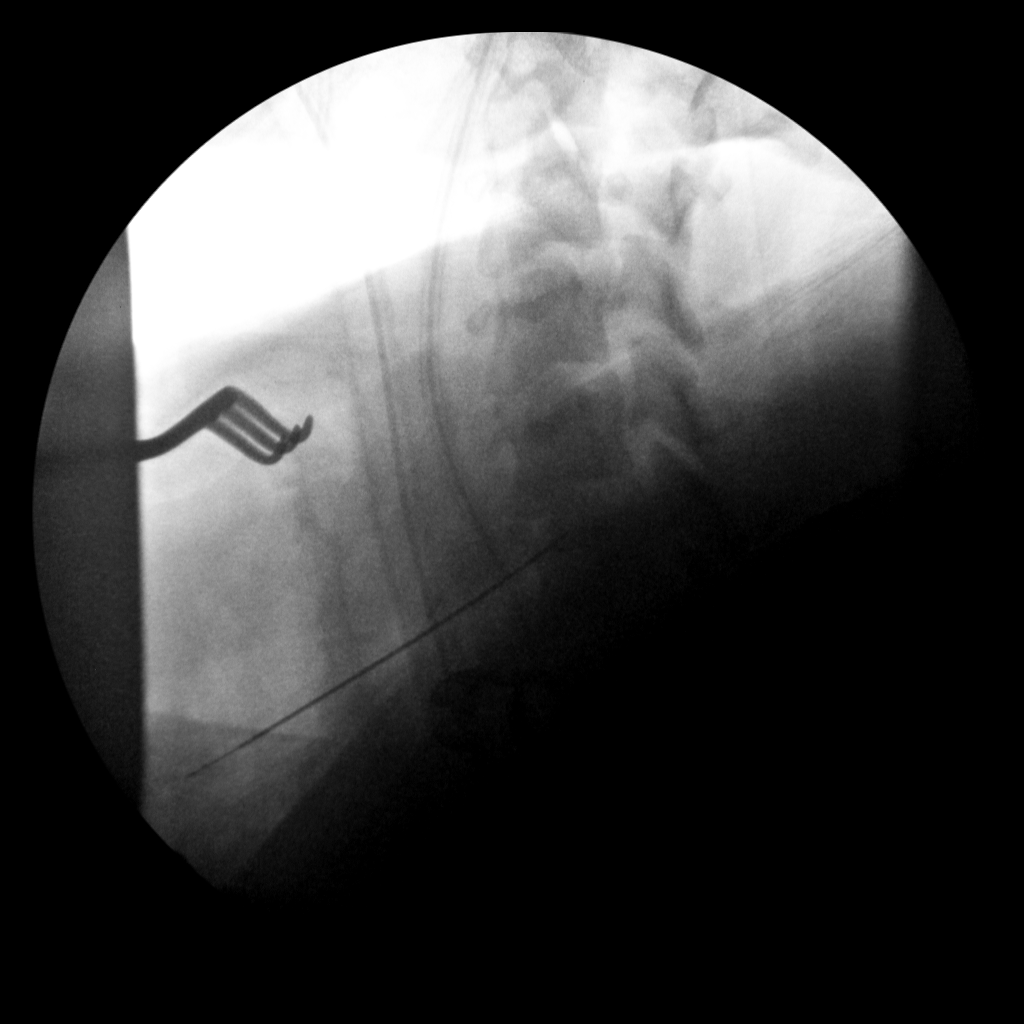

[2 of 2 positions shown; findings below may reference images not displayed]

FINDINGS: Two intraoperative cross-table lateral fluoroscopic spot images are
provided. The initial image demonstrates localization of the C5-C6
disc space via an anterior approach needle.

The 2nd and final image is degraded due to quantum mottle artifact
but multilevel ACDF hardware is evident. It is unclear which levels
are depicted.
IMPRESSION: Intraoperative localization at C5-C6.

## 2018-12-01 DIAGNOSIS — R030 Elevated blood-pressure reading, without diagnosis of hypertension: Secondary | ICD-10-CM | POA: Diagnosis not present

## 2018-12-01 DIAGNOSIS — M5412 Radiculopathy, cervical region: Secondary | ICD-10-CM | POA: Diagnosis not present

## 2018-12-01 DIAGNOSIS — Z6841 Body Mass Index (BMI) 40.0 and over, adult: Secondary | ICD-10-CM | POA: Diagnosis not present

## 2018-12-27 MED FILL — TRULICITY 1.5 MG/0.5 ML PEN: 1.5 | 28 days supply | Qty: 2 | Fill #4

## 2018-12-27 MED FILL — EZETIMIBE 10 MG TABS: 10 | 90 days supply | Qty: 90 | Fill #3

## 2018-12-27 MED FILL — JARDIANCE 25 MG TABLET: 25 | 30 days supply | Qty: 30 | Fill #4

## 2018-12-27 MED FILL — JANUMET 50-1,000 MG TABLET: 50-1000 | 30 days supply | Qty: 60 | Fill #3

## 2018-12-27 MED FILL — ATORVASTATIN 40 MG TABLET: 40 | 90 days supply | Qty: 90 | Fill #1

## 2018-12-28 MED FILL — ASPIRIN LOW DOSE 81 MG TBEC: 81 | 90 days supply | Qty: 90 | Fill #0

## 2018-12-28 MED FILL — ACCU-CHEK GUIDE TEST STRIP: 50 days supply | Qty: 100 | Fill #0

## 2018-12-30 ENCOUNTER — Ambulatory Visit: Payer: Self-pay | Admitting: Neurology

## 2019-01-05 DIAGNOSIS — M25551 Pain in right hip: Secondary | ICD-10-CM | POA: Diagnosis not present

## 2019-02-04 MED FILL — JARDIANCE 25 MG TABLET: 25 | 30 days supply | Qty: 30 | Fill #5

## 2019-02-04 MED FILL — TRULICITY 1.5 MG/0.5 ML PEN: 1.5 | 28 days supply | Qty: 2 | Fill #0

## 2019-02-04 MED FILL — JANUMET 50-1,000 MG TABLET: 50-1000 | 30 days supply | Qty: 60 | Fill #4

## 2019-02-09 DIAGNOSIS — M7061 Trochanteric bursitis, right hip: Secondary | ICD-10-CM | POA: Diagnosis not present

## 2019-02-09 DIAGNOSIS — M25551 Pain in right hip: Secondary | ICD-10-CM | POA: Diagnosis not present

## 2019-02-09 MED FILL — DICLOFENAC SODIUM 75 MG TAB: 75 | 30 days supply | Qty: 60 | Fill #0

## 2019-03-23 DIAGNOSIS — M7061 Trochanteric bursitis, right hip: Secondary | ICD-10-CM | POA: Diagnosis not present

## 2019-03-23 DIAGNOSIS — M25551 Pain in right hip: Secondary | ICD-10-CM | POA: Diagnosis not present

## 2019-03-24 MED FILL — TRULICITY 1.5 MG/0.5 ML PEN: 1.5 | 28 days supply | Qty: 2 | Fill #1

## 2019-03-24 MED FILL — JANUMET 50-1,000 MG TABLET: 50-1000 | 30 days supply | Qty: 60 | Fill #5

## 2019-03-24 MED FILL — ATORVASTATIN 40 MG TABLET: 40 | 90 days supply | Qty: 90 | Fill #0

## 2019-03-24 MED FILL — ASPIRIN 81 MG TBEC: 81 | 90 days supply | Qty: 90 | Fill #1

## 2019-03-24 MED FILL — JARDIANCE 25 MG TABLET: 25 | 90 days supply | Qty: 90 | Fill #0

## 2019-03-24 MED FILL — EZETIMIBE 10 MG TABS: 10 | 90 days supply | Qty: 90 | Fill #0

## 2019-03-24 MED FILL — DICLOFENAC SODIUM 75 MG TAB: 75 | 30 days supply | Qty: 60 | Fill #1

## 2019-03-24 MED FILL — ACCU-CHEK GUIDE TEST STRIP: 50 days supply | Qty: 100 | Fill #1

## 2019-04-08 DIAGNOSIS — H40013 Open angle with borderline findings, low risk, bilateral: Secondary | ICD-10-CM | POA: Diagnosis not present

## 2019-05-04 MED FILL — TRULICITY 1.5 MG/0.5 ML PEN: 1.5 | 28 days supply | Qty: 2 | Fill #0

## 2019-05-06 DIAGNOSIS — K760 Fatty (change of) liver, not elsewhere classified: Secondary | ICD-10-CM | POA: Diagnosis not present

## 2019-05-06 DIAGNOSIS — G4733 Obstructive sleep apnea (adult) (pediatric): Secondary | ICD-10-CM | POA: Diagnosis not present

## 2019-05-06 DIAGNOSIS — E785 Hyperlipidemia, unspecified: Secondary | ICD-10-CM | POA: Diagnosis not present

## 2019-05-06 DIAGNOSIS — I779 Disorder of arteries and arterioles, unspecified: Secondary | ICD-10-CM | POA: Diagnosis not present

## 2019-05-06 DIAGNOSIS — E1149 Type 2 diabetes mellitus with other diabetic neurological complication: Secondary | ICD-10-CM | POA: Diagnosis not present

## 2019-05-06 DIAGNOSIS — K219 Gastro-esophageal reflux disease without esophagitis: Secondary | ICD-10-CM | POA: Diagnosis not present

## 2019-05-06 DIAGNOSIS — E1142 Type 2 diabetes mellitus with diabetic polyneuropathy: Secondary | ICD-10-CM | POA: Diagnosis not present

## 2019-05-06 DIAGNOSIS — Z23 Encounter for immunization: Secondary | ICD-10-CM | POA: Diagnosis not present

## 2019-05-06 MED FILL — MECLIZINE 25 MG TABLET: 25 | 10 days supply | Qty: 30 | Fill #0

## 2019-05-29 DIAGNOSIS — U071 COVID-19: Secondary | ICD-10-CM

## 2019-05-29 HISTORY — DX: COVID-19: U07.1

## 2019-06-03 MED FILL — PREGABALIN 50 MG CAPS: 50 | 30 days supply | Qty: 60 | Fill #0

## 2019-06-03 MED FILL — TRULICITY 1.5 MG/0.5 ML PEN: 1.5 | 28 days supply | Qty: 2 | Fill #0

## 2019-06-08 ENCOUNTER — Other Ambulatory Visit: Payer: Self-pay

## 2019-06-08 DIAGNOSIS — Z20822 Contact with and (suspected) exposure to covid-19: Secondary | ICD-10-CM

## 2019-06-10 LAB — NOVEL CORONAVIRUS, NAA: SARS-CoV-2, NAA: DETECTED — AB

## 2019-06-11 ENCOUNTER — Emergency Department (HOSPITAL_BASED_OUTPATIENT_CLINIC_OR_DEPARTMENT_OTHER): Payer: 59

## 2019-06-11 ENCOUNTER — Encounter (HOSPITAL_BASED_OUTPATIENT_CLINIC_OR_DEPARTMENT_OTHER): Payer: Self-pay | Admitting: Emergency Medicine

## 2019-06-11 ENCOUNTER — Emergency Department (HOSPITAL_BASED_OUTPATIENT_CLINIC_OR_DEPARTMENT_OTHER)
Admission: EM | Admit: 2019-06-11 | Discharge: 2019-06-11 | Disposition: A | Discharge status: Home / Self Care | Payer: 59 | Attending: Emergency Medicine | Admitting: Emergency Medicine

## 2019-06-11 ENCOUNTER — Other Ambulatory Visit: Payer: Self-pay

## 2019-06-11 DIAGNOSIS — R05 Cough: Secondary | ICD-10-CM | POA: Insufficient documentation

## 2019-06-11 DIAGNOSIS — Z8673 Personal history of transient ischemic attack (TIA), and cerebral infarction without residual deficits: Secondary | ICD-10-CM | POA: Diagnosis not present

## 2019-06-11 DIAGNOSIS — R509 Fever, unspecified: Secondary | ICD-10-CM

## 2019-06-11 DIAGNOSIS — Z794 Long term (current) use of insulin: Secondary | ICD-10-CM | POA: Insufficient documentation

## 2019-06-11 DIAGNOSIS — Z79899 Other long term (current) drug therapy: Secondary | ICD-10-CM | POA: Insufficient documentation

## 2019-06-11 DIAGNOSIS — Z7982 Long term (current) use of aspirin: Secondary | ICD-10-CM | POA: Insufficient documentation

## 2019-06-11 DIAGNOSIS — M7918 Myalgia, other site: Secondary | ICD-10-CM | POA: Insufficient documentation

## 2019-06-11 DIAGNOSIS — R531 Weakness: Secondary | ICD-10-CM | POA: Diagnosis not present

## 2019-06-11 DIAGNOSIS — U071 COVID-19: Secondary | ICD-10-CM | POA: Diagnosis not present

## 2019-06-11 DIAGNOSIS — I10 Essential (primary) hypertension: Secondary | ICD-10-CM | POA: Diagnosis not present

## 2019-06-11 DIAGNOSIS — E119 Type 2 diabetes mellitus without complications: Secondary | ICD-10-CM | POA: Insufficient documentation

## 2019-06-11 DIAGNOSIS — R519 Headache, unspecified: Secondary | ICD-10-CM | POA: Diagnosis present

## 2019-06-11 LAB — COMPREHENSIVE METABOLIC PANEL
ALT: 27 U/L (ref 0–44)
AST: 22 U/L (ref 15–41)
Albumin: 3.8 g/dL (ref 3.5–5.0)
Alkaline Phosphatase: 71 U/L (ref 38–126)
Anion gap: 7 (ref 5–15)
BUN: 10 mg/dL (ref 6–20)
CO2: 23 mmol/L (ref 22–32)
Calcium: 8.3 mg/dL — ABNORMAL LOW (ref 8.9–10.3)
Chloride: 104 mmol/L (ref 98–111)
Creatinine, Ser: 0.94 mg/dL (ref 0.61–1.24)
GFR calc Af Amer: >60 mL/min (ref >60)
GFR calc non Af Amer: >60 mL/min (ref >60)
Glucose, Bld: 153 mg/dL — ABNORMAL HIGH (ref 70–99)
Potassium: 3.6 mmol/L (ref 3.5–5.1)
Sodium: 134 mmol/L — ABNORMAL LOW (ref 135–145)
Total Bilirubin: 0.6 mg/dL (ref 0.3–1.2)
Total Protein: 7.4 g/dL (ref 6.5–8.1)

## 2019-06-11 LAB — CBC WITH DIFFERENTIAL/PLATELET
Abs Immature Granulocytes: 0.04 10*3/uL (ref 0.00–0.07)
Basophils Absolute: 0 10*3/uL (ref 0.0–0.1)
Basophils Relative: 0 %
Eosinophils Absolute: 0 10*3/uL (ref 0.0–0.5)
Eosinophils Relative: 0 %
HCT: 47.1 % (ref 39.0–52.0)
Hemoglobin: 14.9 g/dL (ref 13.0–17.0)
Immature Granulocytes: 1 %
Lymphocytes Relative: 28 %
Lymphs Abs: 1.3 10*3/uL (ref 0.7–4.0)
MCH: 27.8 pg (ref 26.0–34.0)
MCHC: 31.6 g/dL (ref 30.0–36.0)
MCV: 87.9 fL (ref 80.0–100.0)
Monocytes Absolute: 0.4 10*3/uL (ref 0.1–1.0)
Monocytes Relative: 8 %
Neutro Abs: 2.8 10*3/uL (ref 1.7–7.7)
Neutrophils Relative %: 63 %
Platelets: 183 10*3/uL (ref 150–400)
RBC: 5.36 MIL/uL (ref 4.22–5.81)
RDW: 12.4 % (ref 11.5–15.5)
WBC: 4.5 10*3/uL (ref 4.0–10.5)
nRBC: 0 % (ref 0.0–0.2)

## 2019-06-11 LAB — PROCALCITONIN: Procalcitonin: <0.1 ng/mL

## 2019-06-11 LAB — D-DIMER, QUANTITATIVE: D-Dimer, Quant: 0.41 ug/mL-FEU (ref 0.00–0.50)

## 2019-06-11 LAB — FERRITIN: Ferritin: 437 ng/mL — ABNORMAL HIGH (ref 24–336)

## 2019-06-11 LAB — CK: Total CK: 276 U/L (ref 49–397)

## 2019-06-11 LAB — FIBRINOGEN: Fibrinogen: 563 mg/dL — ABNORMAL HIGH (ref 210–475)

## 2019-06-11 LAB — C-REACTIVE PROTEIN: CRP: 1.7 mg/dL — ABNORMAL HIGH (ref <1.0)

## 2019-06-11 LAB — TRIGLYCERIDES: Triglycerides: 87 mg/dL (ref <150)

## 2019-06-11 LAB — LACTATE DEHYDROGENASE: LDH: 189 U/L (ref 98–192)

## 2019-06-11 LAB — LACTIC ACID, PLASMA: Lactic Acid, Venous: 1.2 mmol/L (ref 0.5–1.9)

## 2019-06-11 MED ORDER — IBUPROFEN 800 MG PO TABS
800.0000 mg | ORAL_TABLET | Freq: Once | ORAL | Status: AC
  Administered 2019-06-11: 800 mg via ORAL
  Filled 2019-06-11: qty 1

## 2019-06-11 MED ORDER — SODIUM CHLORIDE 0.9 % IV BOLUS: 1000.0000 mL | Freq: Once | INTRAVENOUS | Status: DC

## 2019-06-11 MED ORDER — ACETAMINOPHEN 500 MG PO TABS
1000.0000 mg | ORAL_TABLET | Freq: Once | ORAL | Status: AC
  Administered 2019-06-11: 1000 mg via ORAL
  Filled 2019-06-11: qty 2

## 2019-06-11 MED ORDER — IOHEXOL 350 MG/ML SOLN
100.0000 mL | Freq: Once | INTRAVENOUS | Status: AC | PRN
  Administered 2019-06-11: 100 mL via INTRAVENOUS

## 2019-06-11 MED ORDER — SODIUM CHLORIDE 0.9 % IV BOLUS
1000.0000 mL | Freq: Once | INTRAVENOUS | Status: AC
  Administered 2019-06-11: 1000 mL via INTRAVENOUS

## 2019-06-11 NOTE — ED Provider Notes (Signed)
5:40 PM Patient awake, alert, states that he feels substantially better with fluid resuscitation received here. I reviewed the CT findings with him, discussed importance of following up with primary care for appropriate monitoring of findings. Patient in agreement, discharged in stable condition.   Carmin Muskrat, MD 06/11/19 [phone]

## 2019-06-11 NOTE — ED Notes (Signed)
Patient transported to CT 

## 2019-06-11 NOTE — Discharge Instructions (Addendum)
Keep yourself hydrated.  Watch your blood sugars closely.  Use Tylenol or Motrin as needed for pain and fever.  Keep yourself hydrated.  Keep yourself isolated at home -until you are fever free for 7 days. Return to the ED with new or worsening symptoms.  Below is the CT interpretation from today.  It is important that you discuss this with your physician for appropriate ongoing care:  IMPRESSION: 1. Mild fusiform dilatation of the ascending thoracic aorta with maximum diameter of 3.9 cm. No dissection. 2. Poor opacification of the pulmonary arteries but no obvious large central pulmonary embolus. 3. Patchy bilateral ground-glass opacities consistent with atypical/viral pneumonia such as COVID-19.

## 2019-06-11 NOTE — ED Notes (Signed)
ED Provider at bedside. 

## 2019-06-11 NOTE — ED Provider Notes (Signed)
[facility] Provider Note   CSN: [id number] Arrival date & time: 06/11/19  1232     History   Chief Complaint Chief Complaint  Patient presents with   Headache    HPI Stephen Dunlap is a 48 y.o. male.     Patient with history of obesity, hypertension, diabetes presenting from home with "feeling dehydrated".  States has not felt well for the past 2 days.  He said chills, body aches, headache and fever.  He was diagnosed with coronavirus yesterday.  States he was exposed to his wife who is positive.  He was tested on November 11 but did not get his results until yesterday.  He states he feels dehydrated because he went to check his blood sugar this morning his blood was stuck.  He denies any vomiting or diarrhea.  He denies any cough, chest pain, shortness of breath.  No focal weakness, numbness or tingling.  Did take Tylenol prior to arrival.  Complains of feeling dehydrated and achy all over as well as a headache.  Denies any focal weakness, numbness or tingling.  No neck pain or stiffness.  No difficulty speaking or difficulty swallowing.  The history is provided by the patient.  Headache Associated symptoms: cough, fatigue, fever, myalgias and weakness   Associated symptoms: no abdominal pain, no nausea and no vomiting     Past Medical History:  Diagnosis Date   Diabetes mellitus    Hypertension    Morbid obesity ([place])    Prostatitis    Sleep apnea    CPAP machine, 4-10 pressure settings   TIA (transient ischemic attack)    October 2018, possible?   TIA (transient ischemic attack)    UTI (urinary tract infection)     Patient Active Problem List   Diagnosis Date Noted   Cervical radiculopathy 02/08/2018   TIA (transient ischemic attack) 04/29/2017   Acute bronchitis 03/02/2015   Wheezing 03/02/2015   Cough 02/26/2015   Pneumonia, organism unspecified(486) 02/26/2015   Shift work sleep disorder 03/03/2014   GERD  (gastroesophageal reflux disease) 06/22/2013   Depression, acute 06/22/2013   Allergic rhinitis, cause unspecified 12/15/2012   Routine general medical examination at a health care facility 12/02/2012   OSA (obstructive sleep apnea) 11/17/2011   Hyperlipidemia with target LDL less than 100 10/31/2010   Diabetes mellitus type 2 with complications ([place]) [id number]   OBESITY, MORBID 06/10/2010   Essential hypertension 06/10/2010    Past Surgical History:  Procedure Laterality Date   ANTERIOR CERVICAL DECOMP/DISCECTOMY FUSION N/A 02/08/2018   Procedure: Anterior Cervical Decompression Fusion - Cervical Six-Cervical Seven - Cervical Seven -Thoracic One;  Surgeon: Earnie Larsson, MD;  Location: [facility];  Service: Neurosurgery;  Laterality: N/A;   cysto bedside     HERNIA REPAIR     WISDOM TOOTH EXTRACTION          Home Medications    Prior to Admission medications   Medication Sig Start Date End Date Taking? Authorizing Provider  albuterol (PROVENTIL HFA;VENTOLIN HFA) 108 (90 BASE) MCG/ACT inhaler Inhale 2 puffs into the lungs every 6 (six) hours as needed for wheezing or shortness of breath. 03/02/15   Biagio Borg, MD  aspirin EC 81 MG tablet Take 81 mg by mouth daily.    [provider]  clonazePAM (KLONOPIN) 1 MG tablet Take 1 mg by mouth at bedtime.     [provider]  empagliflozin (JARDIANCE) 25 MG TABS tablet Take 25 mg by mouth  daily.    [provider]  EPINEPHrine (AUVI-Q) 0.3 mg/0.3 mL IJ SOAJ injection Inject 0.3 mg into the muscle once.    [provider]  fluticasone (FLONASE) 50 MCG/ACT nasal spray Place 1 spray into both nostrils daily as needed for allergies or rhinitis.    [provider]  HYDROcodone-acetaminophen (NORCO/VICODIN) 5-325 MG tablet Take 1-2 tablets by mouth every 4 (four) hours as needed for moderate pain ((score 4 to 6)). 02/09/18   Earnie Larsson, MD  ibuprofen (ADVIL,MOTRIN) 800 MG tablet Take 800 mg by  mouth every 8 (eight) hours as needed for mild pain or moderate pain.     [provider]  JANUMET 50-1000 MG tablet Take 1 tablet by mouth 2 (two) times daily with a meal.  11/28/16   [provider]  montelukast (SINGULAIR) 10 MG tablet TAKE 1 TABLET BY MOUTH EACH NIGHT AT BEDTIME 12/25/17   Kozlow, Donnamarie Poag, MD  olopatadine (PATANOL) 0.1 % ophthalmic solution INSTILL 1 DROP INTO BOTH EYES DAILY Patient taking differently: INSTILL 1 DROP INTO BOTH EYES DAILY AS NEEDED FOR ALLERGIES 12/25/17   Kozlow, Donnamarie Poag, MD  omeprazole (PRILOSEC) 20 MG capsule Take 1 capsule (20 mg total) by mouth daily. Patient taking differently: Take 20 mg by mouth daily as needed (acid reflux).  02/26/15   Janith Lima, MD  TRULICITY 1.5 0000000 SOPN Inject 1.5 mg as directed every Monday. 01/11/18   [provider]    Family History Family History  Problem Relation Age of Onset   Arthritis Other    Cancer Other        breast   Diabetes Other    Hypertension Other    Hyperlipidemia Other     Social History Social History   Tobacco Use   Smoking status: Never Smoker   Smokeless tobacco: Never Used  Substance Use Topics   Alcohol use: Yes    Alcohol/week: 2.0 standard drinks    Types: 2 Cans of beer per week    Comment: Socially, not weekly   Drug use: No     Allergies   Other and Invokana [canagliflozin]   Review of Systems Review of Systems  Constitutional: Positive for activity change, appetite change, fatigue and fever.  Respiratory: Positive for cough. Negative for chest tightness and shortness of breath.   Cardiovascular: Negative for chest pain.  Gastrointestinal: Negative for abdominal pain, nausea and vomiting.  Genitourinary: Negative for dysuria and hematuria.  Musculoskeletal: Positive for arthralgias and myalgias.  Skin: Negative for rash.  Neurological: Positive for weakness and headaches.    all other systems are negative except as noted in the  HPI and PMH.    Physical Exam Updated Vital Signs BP 121/80 (BP Location: Right Arm)    Pulse 99    Temp (!) 102.5 F (39.2 C) (Oral)    Resp 18    Ht 6\' 5"  (1.956 m)    Wt (!) 176.9 kg    SpO2 100%    BMI 46.25 kg/m   Physical Exam Vitals signs and nursing note reviewed.  Constitutional:      General: He is not in acute distress.    Appearance: He is well-developed. He is obese.     Comments: Nontoxic  HENT:     Head: Normocephalic and atraumatic.     Mouth/Throat:     Pharynx: No oropharyngeal exudate.  Eyes:     Conjunctiva/sclera: Conjunctivae normal.     Pupils: Pupils are equal, round,  and reactive to light.  Neck:     Musculoskeletal: Normal range of motion and neck supple.     Comments: No meningismus. No meningismus, no pain with range of motion of neck Cardiovascular:     Rate and Rhythm: Normal rate and regular rhythm.     Heart sounds: Normal heart sounds. No murmur.  Pulmonary:     Effort: Pulmonary effort is normal. No respiratory distress.     Breath sounds: Normal breath sounds.  Chest:     Chest wall: No tenderness.  Abdominal:     Palpations: Abdomen is soft.     Tenderness: There is no abdominal tenderness. There is no guarding or rebound.  Musculoskeletal: Normal range of motion.        General: No tenderness.  Skin:    General: Skin is warm.  Neurological:     Mental Status: He is alert and oriented to person, place, and time.     Cranial Nerves: No cranial nerve deficit.     Motor: No abnormal muscle tone.     Coordination: Coordination normal.     Comments:  5/5 strength throughout. CN 2-12 intact.Equal grip strength.   Psychiatric:        Behavior: Behavior normal.      ED Treatments / Results  Labs (all labs ordered are listed, but only abnormal results are displayed) Labs Reviewed  COMPREHENSIVE METABOLIC PANEL - Abnormal; Notable for the following components:      Result Value   Sodium 134 (*)    Glucose, Bld 153 (*)    Calcium  8.3 (*)    All other components within normal limits  CULTURE, BLOOD (ROUTINE X 2)  CULTURE, BLOOD (ROUTINE X 2)  LACTIC ACID, PLASMA  CBC WITH DIFFERENTIAL/PLATELET  CK  D-DIMER, QUANTITATIVE (NOT AT [facility])  PROCALCITONIN  LACTATE DEHYDROGENASE  FERRITIN  TRIGLYCERIDES  FIBRINOGEN  C-REACTIVE PROTEIN    EKG EKG Interpretation  Date/Time:  Saturday June 11 2019 13:36:14 EST Ventricular Rate:  94 PR Interval:    QRS Duration: 105 QT Interval:  332 QTC Calculation: 416 R Axis:   -34 Text Interpretation: Sinus rhythm Left axis deviation Baseline wander in lead(s) V1 No significant change was found Confirmed by Ezequiel Essex [phone]) on 06/11/2019 1:38:54 PM   Radiology Dg Chest Port 1 View  Result Date: 06/11/2019 CLINICAL DATA:  Chills, headache, and fever. COVID diagnosis. EXAM: PORTABLE CHEST 1 VIEW COMPARISON:  04/29/2017 FINDINGS: Lower cervical plate and screw fixator. Upper normal heart size. The lungs appear clear. No appreciable blunting of the costophrenic angles. Mild prominence of the ascending aorta which could be due to tortuosity or aneurysm. IMPRESSION: 1. Mild prominence of the ascending aorta which could be due to tortuosity or aneurysm. 2. Otherwise, no significant abnormalities are observed. Electronically Signed   By: Van Clines M.D.   On: 06/11/2019 13:25   Ct Angio Chest Aorta W And/or Wo Contrast  Result Date: 06/11/2019 CLINICAL DATA:  Chest pain.  Positive COVID-19. EXAM: CT ANGIOGRAPHY CHEST WITH CONTRAST TECHNIQUE: Multidetector CT imaging of the chest was performed using the standard protocol during bolus administration of intravenous contrast. Multiplanar CT image reconstructions and MIPs were obtained to evaluate the vascular anatomy. CONTRAST:  158mL OMNIPAQUE IOHEXOL 350 MG/ML SOLN COMPARISON:  None. FINDINGS: Cardiovascular: The heart is normal in size. No pericardial effusion. Mild tortuosity and ectasia of the thoracic aorta but no  focal aneurysm. Mild fusiform dilatation of the ascending aorta with maximum measurement of 3.9  cm. No dissection. The branch vessels are patent. No definite coronary artery calcifications. The pulmonary arteries are not well opacified. No large gross central pulmonary emboli are identified. Mediastinum/Nodes: No mediastinal or hilar mass or adenopathy. Small scattered lymph nodes are noted. The esophagus is grossly normal. Lungs/Pleura: Numerous patchy peripheral ground-glass opacities consistent with atypical/viral pneumonia such as COVID-19. No focal airspace consolidation or pleural effusion. No pulmonary edema. Upper Abdomen: The no significant upper abdominal findings. Suspect diffuse fatty infiltration of the liver. Musculoskeletal: Mild bilateral gynecomastia. The bony structures are intact. Review of the MIP images confirms the above findings. IMPRESSION: 1. Mild fusiform dilatation of the ascending thoracic aorta with maximum diameter of 3.9 cm. No dissection. 2. Poor opacification of the pulmonary arteries but no obvious large central pulmonary embolus. 3. Patchy bilateral ground-glass opacities consistent with atypical/viral pneumonia such as COVID-19. Electronically Signed   By: Marijo Sanes M.D.   On: 06/11/2019 16:37    Procedures Procedures (including critical care time)  Medications Ordered in ED Medications - No data to display   Initial Impression / Assessment and Plan / ED Course  I have reviewed the triage vital signs and the nursing notes.  Pertinent labs & imaging results that were available during my care of the patient were reviewed by me and considered in my medical decision making (see chart for details).       Patient with coronavirus complaining of feeling dehydrated.  He has had aches, chills, headache and fever.  He appears well nontoxic with normal neurological exam.  No meningismus.  Given gentle hydration.  Labs with hyperglycemia, no DKA, normal lactate.  CXR  with enlarged mediastinum. No chest pain or back pain.  Patient feeling improved. Headache resolved, fever improving.  Suspect symptoms due to COVID. DOubt bacterial meningitis. Patient well appearing. No meningismus. Low suspicion for meningitis. Patient agrees with deferring LP at this time.   Tolerating PO. D/w patient and wife home quarantine. Watch blood sugars closely. No indication for steroids.  CTA chest for further evaluation of widen mediastinum pending at time of shift change. Dr Vanita Panda to disposition subsequent to this.   Kyzir Matsuyama Montilla was evaluated in Emergency Department on 06/11/2019 for the symptoms described in the history of present illness. He was evaluated in the context of the global COVID-19 pandemic, which necessitated consideration that the patient might be at risk for infection with the SARS-CoV-2 virus that causes COVID-19. Institutional protocols and algorithms that pertain to the evaluation of patients at risk for COVID-19 are in a state of rapid change based on information released by regulatory bodies including the CDC and federal and state organizations. These policies and algorithms were followed during the patient's care in the ED.   Final Clinical Impressions(s) / ED Diagnoses   Final diagnoses:  COVID-19 virus infection  Fever, unspecified fever cause    ED Discharge Orders    None       Ramonia Mcclaran, Annie Main, MD 06/11/19 1713

## 2019-06-11 NOTE — ED Triage Notes (Signed)
Pt dx with covid, c/o HA with chills and feeling dehydrated. Pt states "blood was thick this morning". Pt febrile in triage, reports 1 g tylenol at 0600 today. Pt ambulatory, normal gait in triage

## 2019-06-14 MED FILL — ALBUTEROL SULFATE HFA 108 (: 108 (90 BAS | 16 days supply | Qty: 18 | Fill #0

## 2019-06-16 LAB — CULTURE, BLOOD (ROUTINE X 2)
Culture: NO GROWTH
Culture: NO GROWTH
Special Requests: ADEQUATE
Special Requests: ADEQUATE

## 2019-07-13 MED FILL — ESOMEPRAZOLE MAG DR 40 MG C: 40 | 90 days supply | Qty: 90 | Fill #0

## 2019-07-13 MED FILL — HYDROCODONE-HOMATROPINE SYR: 5-1.5 | 30 days supply | Qty: 150 | Fill #0

## 2019-07-15 MED FILL — EZETIMIBE 10 MG TABS: 10 | 90 days supply | Qty: 90 | Fill #1

## 2019-07-15 MED FILL — JARDIANCE 25 MG TABLET: 25 | 90 days supply | Qty: 90 | Fill #1

## 2019-07-15 MED FILL — ASPIRIN 81 MG TBEC: 81 | 90 days supply | Qty: 90 | Fill #2

## 2019-07-15 MED FILL — ATORVASTATIN 40 MG TABLET: 40 | 90 days supply | Qty: 90 | Fill #1

## 2019-07-15 MED FILL — TRULICITY 1.5 MG/0.5 ML PEN: 1.5 | 28 days supply | Qty: 2 | Fill #1

## 2019-07-15 MED FILL — JANUMET 50-1,000 MG TABLET: 50-1000 | 30 days supply | Qty: 60 | Fill #6

## 2019-08-02 ENCOUNTER — Other Ambulatory Visit: Payer: Self-pay

## 2019-08-02 ENCOUNTER — Institutional Professional Consult (permissible substitution) (INDEPENDENT_AMBULATORY_CARE_PROVIDER_SITE_OTHER): Payer: 59 | Admitting: Thoracic Surgery (Cardiothoracic Vascular Surgery)

## 2019-08-02 ENCOUNTER — Encounter: Payer: Self-pay | Admitting: Thoracic Surgery (Cardiothoracic Vascular Surgery)

## 2019-08-02 VITALS — BP 146/80 | HR 93 | Temp 97.5°F | Resp 20 | Ht 77.0 in | Wt 378.0 lb

## 2019-08-02 DIAGNOSIS — I712 Thoracic aortic aneurysm, without rupture: Secondary | ICD-10-CM | POA: Diagnosis not present

## 2019-08-02 DIAGNOSIS — I7121 Aneurysm of the ascending aorta, without rupture: Secondary | ICD-10-CM

## 2019-08-02 NOTE — Progress Notes (Signed)
PCP is Reynold Bowen, MD Referring Provider is Reynold Bowen, MD  Chief Complaint  Patient presents with  . Consult    ectatic aorta    HPI: Stephen Dunlap is sent for consultation regarding an ectatic ascending aorta.  Stephen Dunlap is a 49 year old man with a past medical history significant for obesity, hypertension, type II non-insulin-dependent diabetes, asthma, sleep apnea, and possible TIA.  Back in November he was feeling poorly and was found to have COVID-19.  He was complaining of headaches, fatigue, and general malaise.  He had a chest x-ray which showed some prominence of the ascending aorta.  A CT angiogram showed a 3.9 cm ectatic ascending aorta.  There is no pulmonary embolus.  There was groundglass opacity consistent with COVID-19.  He denies any chest pain, pressure, or tightness.  He was very active when he was younger and is not aware of ever having had a heart murmur.  Past Medical History:  Diagnosis Date  . Diabetes mellitus   . Hypertension   . Morbid obesity ([place])   . Prostatitis   . Sleep apnea    CPAP machine, 4-10 pressure settings  . TIA (transient ischemic attack)    October 2018, possible?  . TIA (transient ischemic attack)   . UTI (urinary tract infection)     Past Surgical History:  Procedure Laterality Date  . ANTERIOR CERVICAL DECOMP/DISCECTOMY FUSION N/A 02/08/2018   Procedure: Anterior Cervical Decompression Fusion - Cervical Six-Cervical Seven - Cervical Seven -Thoracic One;  Surgeon: Earnie Larsson, MD;  Location: [facility];  Service: Neurosurgery;  Laterality: N/A;  . cysto bedside    . HERNIA REPAIR    . WISDOM TOOTH EXTRACTION      Family History  Problem Relation Age of Onset  . Arthritis Other   . Cancer Other        breast  . Diabetes Other   . Hypertension Other   . Hyperlipidemia Other     Social History Social History   Tobacco Use  . Smoking status: Never Smoker  . Smokeless tobacco: Never Used  Substance Use Topics  . Alcohol  use: Yes    Alcohol/week: 2.0 standard drinks    Types: 2 Cans of beer per week    Comment: Socially, not weekly  . Drug use: No    Current Outpatient Medications  Medication Sig Dispense Refill  . albuterol (PROVENTIL HFA;VENTOLIN HFA) 108 (90 BASE) MCG/ACT inhaler Inhale 2 puffs into the lungs every 6 (six) hours as needed for wheezing or shortness of breath. 1 Inhaler 2  . aspirin EC 81 MG tablet Take 81 mg by mouth daily.    . clonazePAM (KLONOPIN) 1 MG tablet Take 1 mg by mouth at bedtime.     . empagliflozin (JARDIANCE) 25 MG TABS tablet Take 25 mg by mouth daily.    Marland Kitchen EPINEPHrine (AUVI-Q) 0.3 mg/0.3 mL IJ SOAJ injection Inject 0.3 mg into the muscle once.    . fluticasone (FLONASE) 50 MCG/ACT nasal spray Place 1 spray into both nostrils daily as needed for allergies or rhinitis.    Marland Kitchen HYDROcodone-acetaminophen (NORCO/VICODIN) 5-325 MG tablet Take 1-2 tablets by mouth every 4 (four) hours as needed for moderate pain ((score 4 to 6)). 50 tablet 0  . ibuprofen (ADVIL,MOTRIN) 800 MG tablet Take 800 mg by mouth every 8 (eight) hours as needed for mild pain or moderate pain.     Marland Kitchen JANUMET 50-1000 MG tablet Take 1 tablet by mouth 2 (two) times daily with  a meal.   7  . montelukast (SINGULAIR) 10 MG tablet TAKE 1 TABLET BY MOUTH EACH NIGHT AT BEDTIME 30 tablet 0  . olopatadine (PATANOL) 0.1 % ophthalmic solution INSTILL 1 DROP INTO BOTH EYES DAILY (Patient taking differently: INSTILL 1 DROP INTO BOTH EYES DAILY AS NEEDED FOR ALLERGIES) 5 mL 0  . omeprazole (PRILOSEC) 20 MG capsule Take 1 capsule (20 mg total) by mouth daily. (Patient taking differently: Take 20 mg by mouth daily as needed (acid reflux). ) 90 capsule 3  . TRULICITY 1.5 0000000 SOPN Inject 1.5 mg as directed every Monday.  5   No current facility-administered medications for this visit.    Allergies  Allergen Reactions  . Other Anaphylaxis    Tree nuts  . Invokana [Canagliflozin] Other (See Comments)    yeast    Review  of Systems  Constitutional: Positive for unexpected weight change (Has lost 40 pounds since Covid). Negative for activity change and appetite change.  HENT: Negative for trouble swallowing and voice change.   Respiratory: Positive for wheezing (Occasional, usually seasonal). Negative for shortness of breath.   Cardiovascular: Negative for chest pain and leg swelling.  Gastrointestinal: Positive for abdominal pain (Reflux).  Genitourinary: Negative for dysuria.  Musculoskeletal: Negative for arthralgias and myalgias.  Neurological: Negative for syncope and weakness.       TIA 2018    BP (!) 146/80 (BP Location: Left Arm) Comment (BP Location): manual check  Pulse 93   Temp (!) 97.5 F (36.4 C) (Skin)   Resp 20   Ht 6\' 5"  (1.956 m)   Wt (!) 378 lb (171.5 kg)   SpO2 97% Comment: RA  BMI 44.82 kg/m  Physical Exam Vitals reviewed.  Constitutional:      General: He is not in acute distress.    Appearance: He is obese.  HENT:     Head: Normocephalic and atraumatic.  Eyes:     General: No scleral icterus.    Extraocular Movements: Extraocular movements intact.  Cardiovascular:     Rate and Rhythm: Normal rate and regular rhythm.     Heart sounds: Normal heart sounds. No murmur. No friction rub. No gallop.   Pulmonary:     Effort: Pulmonary effort is normal. No respiratory distress.     Breath sounds: Normal breath sounds. No wheezing or rales.  Abdominal:     General: There is no distension.     Palpations: Abdomen is soft.     Tenderness: There is no abdominal tenderness.  Musculoskeletal:     Cervical back: Neck supple.     Left lower leg: Edema (Trace) present.  Lymphadenopathy:     Cervical: No cervical adenopathy.  Skin:    General: Skin is warm and dry.  Neurological:     General: No focal deficit present.     Mental Status: He is alert and oriented to person, place, and time.     Cranial Nerves: No cranial nerve deficit.     Motor: No weakness.    Diagnostic  Tests: CT ANGIOGRAPHY CHEST WITH CONTRAST  TECHNIQUE: Multidetector CT imaging of the chest was performed using the standard protocol during bolus administration of intravenous contrast. Multiplanar CT image reconstructions and MIPs were obtained to evaluate the vascular anatomy.  CONTRAST:  191mL OMNIPAQUE IOHEXOL 350 MG/ML SOLN  COMPARISON:  None.  FINDINGS: Cardiovascular: The heart is normal in size. No pericardial effusion. Mild tortuosity and ectasia of the thoracic aorta but no focal aneurysm. Mild fusiform dilatation  of the ascending aorta with maximum measurement of 3.9 cm. No dissection. The branch vessels are patent. No definite coronary artery calcifications.  The pulmonary arteries are not well opacified. No large gross central pulmonary emboli are identified.  Mediastinum/Nodes: No mediastinal or hilar mass or adenopathy. Small scattered lymph nodes are noted. The esophagus is grossly normal.  Lungs/Pleura: Numerous patchy peripheral ground-glass opacities consistent with atypical/viral pneumonia such as COVID-19.  No focal airspace consolidation or pleural effusion. No pulmonary edema.  Upper Abdomen: The no significant upper abdominal findings. Suspect diffuse fatty infiltration of the liver.  Musculoskeletal: Mild bilateral gynecomastia. The bony structures are intact.  Review of the MIP images confirms the above findings.  IMPRESSION: 1. Mild fusiform dilatation of the ascending thoracic aorta with maximum diameter of 3.9 cm. No dissection. 2. Poor opacification of the pulmonary arteries but no obvious large central pulmonary embolus. 3. Patchy bilateral ground-glass opacities consistent with atypical/viral pneumonia such as COVID-19.   Electronically Signed   By: Marijo Sanes M.D.   On: 06/11/2019 16:37 I personally reviewed the CT images and concur with the findings noted above  Impression: Stephen Dunlap is a 23 year old man with  a history of obesity, hypertension, asthma, sleep apnea, type 2 diabetes, and a TIA a couple of years ago.  He came down with COVID-19 a couple of months ago.  During his work-up he had a CT of the chest which showed a 3.9 cm ascending aorta.  Ascending aorta ectasia-3.9 cm.  Mildly enlarged but not dramatically given his size.  Does appear follow-up.  I recommended repeating a CT in 1 year.  Hypertension-his blood pressure is mildly elevated in the office today.  This not unusual for someone being referred to a surgeon.  He says that his wife checks his blood pressure on a fairly regular basis at home and the systolics are usually in the 120-130 range.  I recommended that he checks on a regular basis and would like to keep his systolic less than [id number] ideally but definitely less than 140.  Obesity-he has lost about 40 pounds since having Covid.  I emphasized the importance of weight loss for his long-term health in general.  Also emphasized the importance of regular exercise.  Plan: Return in 1 year with CT angio of chest  Melrose Nakayama, MD [facility] [phone]

## 2019-08-03 DIAGNOSIS — B353 Tinea pedis: Secondary | ICD-10-CM | POA: Diagnosis not present

## 2019-08-03 DIAGNOSIS — Z9189 Other specified personal risk factors, not elsewhere classified: Secondary | ICD-10-CM | POA: Diagnosis not present

## 2019-08-03 DIAGNOSIS — B351 Tinea unguium: Secondary | ICD-10-CM | POA: Diagnosis not present

## 2019-08-03 DIAGNOSIS — E139 Other specified diabetes mellitus without complications: Secondary | ICD-10-CM | POA: Diagnosis not present

## 2019-09-06 MED FILL — TRULICITY 1.5 MG/0.5 ML PEN: 1.5 | 28 days supply | Qty: 2 | Fill #2

## 2019-09-07 ENCOUNTER — Other Ambulatory Visit (HOSPITAL_COMMUNITY): Payer: Self-pay | Admitting: Endocrinology

## 2019-09-07 MED FILL — ASPIRIN 81 MG TBEC: 81 | 30 days supply | Qty: 30 | Fill #0

## 2019-09-07 MED FILL — EZETIMIBE 10 MG TABS: 10 | 30 days supply | Qty: 30 | Fill #0

## 2019-09-07 MED FILL — ATORVASTATIN 40 MG TABLET: 40 | 30 days supply | Qty: 30 | Fill #0

## 2019-09-21 MED FILL — JARDIANCE 25 MG TABLET: 25 | 30 days supply | Qty: 30 | Fill #0

## 2019-10-17 MED FILL — AMOXICILLIN 500 MG CAPSULE: 500 | 7 days supply | Qty: 21 | Fill #0

## 2019-10-17 MED FILL — HYDROCODON-APAP 5-325: 5-325 | 2 days supply | Qty: 8 | Fill #0

## 2019-10-24 MED FILL — TRULICITY 1.5 MG/0.5 ML PEN: 1.5 | 28 days supply | Qty: 2 | Fill #3

## 2019-10-24 MED FILL — EZETIMIBE 10 MG TABS: 10 | 30 days supply | Qty: 30 | Fill #1

## 2019-10-24 MED FILL — ATORVASTATIN 40 MG TABLET: 40 | 30 days supply | Qty: 30 | Fill #1

## 2019-10-24 MED FILL — ASPIRIN 81 MG TBEC: 81 | 30 days supply | Qty: 30 | Fill #1

## 2019-10-24 MED FILL — JARDIANCE 25 MG TABLET: 25 | 30 days supply | Qty: 30 | Fill #1

## 2019-10-25 MED FILL — PREGABALIN 50 MG CAPS: 50 | 30 days supply | Qty: 60 | Fill #0

## 2019-11-01 DIAGNOSIS — H10413 Chronic giant papillary conjunctivitis, bilateral: Secondary | ICD-10-CM | POA: Diagnosis not present

## 2019-11-01 DIAGNOSIS — H40013 Open angle with borderline findings, low risk, bilateral: Secondary | ICD-10-CM | POA: Diagnosis not present

## 2019-11-01 DIAGNOSIS — D3132 Benign neoplasm of left choroid: Secondary | ICD-10-CM | POA: Diagnosis not present

## 2019-11-01 DIAGNOSIS — H2513 Age-related nuclear cataract, bilateral: Secondary | ICD-10-CM | POA: Diagnosis not present

## 2019-11-01 DIAGNOSIS — E113293 Type 2 diabetes mellitus with mild nonproliferative diabetic retinopathy without macular edema, bilateral: Secondary | ICD-10-CM | POA: Diagnosis not present

## 2019-11-10 DIAGNOSIS — I10 Essential (primary) hypertension: Secondary | ICD-10-CM | POA: Diagnosis not present

## 2019-11-10 DIAGNOSIS — E1140 Type 2 diabetes mellitus with diabetic neuropathy, unspecified: Secondary | ICD-10-CM | POA: Diagnosis not present

## 2019-11-10 DIAGNOSIS — I5189 Other ill-defined heart diseases: Secondary | ICD-10-CM | POA: Diagnosis not present

## 2019-11-10 DIAGNOSIS — G4733 Obstructive sleep apnea (adult) (pediatric): Secondary | ICD-10-CM | POA: Diagnosis not present

## 2019-11-10 DIAGNOSIS — I712 Thoracic aortic aneurysm, without rupture: Secondary | ICD-10-CM | POA: Diagnosis not present

## 2019-11-10 DIAGNOSIS — E1142 Type 2 diabetes mellitus with diabetic polyneuropathy: Secondary | ICD-10-CM | POA: Diagnosis not present

## 2019-11-10 DIAGNOSIS — Z1389 Encounter for screening for other disorder: Secondary | ICD-10-CM | POA: Diagnosis not present

## 2019-11-10 DIAGNOSIS — E7849 Other hyperlipidemia: Secondary | ICD-10-CM | POA: Diagnosis not present

## 2019-11-10 DIAGNOSIS — U071 COVID-19: Secondary | ICD-10-CM | POA: Diagnosis not present

## 2019-11-18 DIAGNOSIS — B353 Tinea pedis: Secondary | ICD-10-CM | POA: Diagnosis not present

## 2019-11-18 DIAGNOSIS — B351 Tinea unguium: Secondary | ICD-10-CM | POA: Diagnosis not present

## 2019-11-18 DIAGNOSIS — Z9189 Other specified personal risk factors, not elsewhere classified: Secondary | ICD-10-CM | POA: Diagnosis not present

## 2019-11-18 DIAGNOSIS — M792 Neuralgia and neuritis, unspecified: Secondary | ICD-10-CM | POA: Diagnosis not present

## 2019-11-18 DIAGNOSIS — E139 Other specified diabetes mellitus without complications: Secondary | ICD-10-CM | POA: Diagnosis not present

## 2019-11-18 MED FILL — TERBINAFINE HCL 250 MG TAB: 250 | 30 days supply | Qty: 30 | Fill #0

## 2019-12-13 MED FILL — TRULICITY 1.5 MG/0.5 ML PEN: 1.5 | 28 days supply | Qty: 2 | Fill #4

## 2019-12-16 DIAGNOSIS — M25551 Pain in right hip: Secondary | ICD-10-CM | POA: Diagnosis not present

## 2019-12-16 DIAGNOSIS — M7061 Trochanteric bursitis, right hip: Secondary | ICD-10-CM | POA: Diagnosis not present

## 2019-12-16 MED FILL — ONETOUCH VERIO REFLECT W/DE: W/DEVICE | 30 days supply | Qty: 1 | Fill #0

## 2019-12-16 MED FILL — ONE TOUCH VERIO TEST STRIP: 25 days supply | Qty: 50 | Fill #0

## 2019-12-16 MED FILL — ONETOUCH DELICA PLUS LANCET: 31 days supply | Qty: 100 | Fill #0

## 2019-12-19 DIAGNOSIS — B351 Tinea unguium: Secondary | ICD-10-CM | POA: Diagnosis not present

## 2019-12-19 DIAGNOSIS — M792 Neuralgia and neuritis, unspecified: Secondary | ICD-10-CM | POA: Diagnosis not present

## 2019-12-19 DIAGNOSIS — E139 Other specified diabetes mellitus without complications: Secondary | ICD-10-CM | POA: Diagnosis not present

## 2019-12-19 DIAGNOSIS — B353 Tinea pedis: Secondary | ICD-10-CM | POA: Diagnosis not present

## 2020-02-09 MED FILL — TRULICITY 1.5 MG/0.5 ML PEN: 1.5 | 28 days supply | Qty: 2 | Fill #5

## 2020-02-10 MED FILL — CLONAZEPAM 1 MG TABS: 1 | 30 days supply | Qty: 30 | Fill #0

## 2020-02-20 DIAGNOSIS — E139 Other specified diabetes mellitus without complications: Secondary | ICD-10-CM | POA: Diagnosis not present

## 2020-02-20 DIAGNOSIS — E1140 Type 2 diabetes mellitus with diabetic neuropathy, unspecified: Secondary | ICD-10-CM | POA: Diagnosis not present

## 2020-02-20 DIAGNOSIS — B351 Tinea unguium: Secondary | ICD-10-CM | POA: Diagnosis not present

## 2020-02-20 DIAGNOSIS — M792 Neuralgia and neuritis, unspecified: Secondary | ICD-10-CM | POA: Diagnosis not present

## 2020-02-20 DIAGNOSIS — L732 Hidradenitis suppurativa: Secondary | ICD-10-CM | POA: Diagnosis not present

## 2020-02-20 DIAGNOSIS — M65872 Other synovitis and tenosynovitis, left ankle and foot: Secondary | ICD-10-CM | POA: Diagnosis not present

## 2020-02-20 MED FILL — INDOMETHACIN ER 75 MG CPCR: 75 | 5 days supply | Qty: 10 | Fill #0

## 2020-02-20 MED FILL — DOXYCYCLINE HYCLATE 100 MG: 100 | 10 days supply | Qty: 20 | Fill #0

## 2020-02-21 DIAGNOSIS — R2231 Localized swelling, mass and lump, right upper limb: Secondary | ICD-10-CM | POA: Diagnosis not present

## 2020-03-01 DIAGNOSIS — B353 Tinea pedis: Secondary | ICD-10-CM | POA: Diagnosis not present

## 2020-03-01 DIAGNOSIS — E139 Other specified diabetes mellitus without complications: Secondary | ICD-10-CM | POA: Diagnosis not present

## 2020-03-01 DIAGNOSIS — B351 Tinea unguium: Secondary | ICD-10-CM | POA: Diagnosis not present

## 2020-03-01 DIAGNOSIS — M792 Neuralgia and neuritis, unspecified: Secondary | ICD-10-CM | POA: Diagnosis not present

## 2020-03-28 DIAGNOSIS — E669 Obesity, unspecified: Secondary | ICD-10-CM | POA: Diagnosis not present

## 2020-03-28 DIAGNOSIS — E1149 Type 2 diabetes mellitus with other diabetic neurological complication: Secondary | ICD-10-CM | POA: Diagnosis not present

## 2020-03-28 DIAGNOSIS — F329 Major depressive disorder, single episode, unspecified: Secondary | ICD-10-CM | POA: Diagnosis not present

## 2020-03-28 DIAGNOSIS — Z0000 Encounter for general adult medical examination without abnormal findings: Secondary | ICD-10-CM | POA: Diagnosis not present

## 2020-03-28 DIAGNOSIS — E1142 Type 2 diabetes mellitus with diabetic polyneuropathy: Secondary | ICD-10-CM | POA: Diagnosis not present

## 2020-03-28 DIAGNOSIS — E785 Hyperlipidemia, unspecified: Secondary | ICD-10-CM | POA: Diagnosis not present

## 2020-03-28 DIAGNOSIS — I712 Thoracic aortic aneurysm, without rupture: Secondary | ICD-10-CM | POA: Diagnosis not present

## 2020-03-28 DIAGNOSIS — G459 Transient cerebral ischemic attack, unspecified: Secondary | ICD-10-CM | POA: Diagnosis not present

## 2020-03-30 MED FILL — CLONAZEPAM 1 MG TABS: 1 | 30 days supply | Qty: 30 | Fill #1

## 2020-04-05 ENCOUNTER — Other Ambulatory Visit (HOSPITAL_COMMUNITY): Payer: Self-pay | Admitting: Endocrinology

## 2020-04-05 MED FILL — TRULICITY 3 MG/0.5ML SOPN: 3 | 28 days supply | Qty: 2 | Fill #0

## 2020-04-05 MED FILL — DOXYCYCLINE HYCLATE 100 MG: 100 | 7 days supply | Qty: 14 | Fill #0

## 2020-04-05 MED FILL — EZETIMIBE 10 MG TABS: 10 | 30 days supply | Qty: 30 | Fill #0

## 2020-04-17 ENCOUNTER — Other Ambulatory Visit: Payer: Self-pay

## 2020-04-17 DIAGNOSIS — I712 Thoracic aortic aneurysm, without rupture, unspecified: Secondary | ICD-10-CM

## 2020-04-20 ENCOUNTER — Telehealth: Payer: Self-pay

## 2020-04-20 NOTE — Telephone Encounter (Signed)
-----   Message from Melrose Nakayama, MD sent at 04/13/2020  7:31 AM EDT ----- Regarding: RE: requesting a CTA Chest Contact: [phone] That has nothing to do with his 3.9 cm aorta and he needs to go back to Dr. Forde Dandy, but you can go ahead and do his CT  [facility] ----- Message ----- From: Marylen Ponto, LPN Sent: 4/81/8563   4:23 PM EDT To: Melrose Nakayama, MD Subject: requesting a CTA Chest                         Stephen Dunlap's wife/Stephen Dunlap is calling requesting a sooner Chest Ct for her husband. He has been C/O upper back pain/chest pressure off and on, numbness and tingling in legs with dizziness. He saw Dr Forde Dandy last week and he gave him a "clean bill of health".  His last CTA Chest was 06/11/19 and has a F/U with you next November,  She is wanting to have the scan sooner then later if you agree.  Please advise [name]

## 2020-05-04 ENCOUNTER — Encounter (HOSPITAL_BASED_OUTPATIENT_CLINIC_OR_DEPARTMENT_OTHER): Payer: Self-pay | Admitting: *Deleted

## 2020-05-04 ENCOUNTER — Emergency Department (HOSPITAL_BASED_OUTPATIENT_CLINIC_OR_DEPARTMENT_OTHER): Payer: 59

## 2020-05-04 ENCOUNTER — Other Ambulatory Visit: Payer: Self-pay

## 2020-05-04 ENCOUNTER — Other Ambulatory Visit (HOSPITAL_BASED_OUTPATIENT_CLINIC_OR_DEPARTMENT_OTHER): Payer: Self-pay | Admitting: Physician Assistant

## 2020-05-04 ENCOUNTER — Emergency Department (HOSPITAL_BASED_OUTPATIENT_CLINIC_OR_DEPARTMENT_OTHER)
Admission: EM | Admit: 2020-05-04 | Discharge: 2020-05-04 | Disposition: A | Discharge status: Home / Self Care | Payer: 59 | Attending: Emergency Medicine | Admitting: Emergency Medicine

## 2020-05-04 DIAGNOSIS — W540XXA Bitten by dog, initial encounter: Secondary | ICD-10-CM | POA: Insufficient documentation

## 2020-05-04 DIAGNOSIS — S61452A Open bite of left hand, initial encounter: Secondary | ICD-10-CM | POA: Diagnosis not present

## 2020-05-04 DIAGNOSIS — E119 Type 2 diabetes mellitus without complications: Secondary | ICD-10-CM | POA: Insufficient documentation

## 2020-05-04 DIAGNOSIS — I10 Essential (primary) hypertension: Secondary | ICD-10-CM | POA: Insufficient documentation

## 2020-05-04 DIAGNOSIS — S61052A Open bite of left thumb without damage to nail, initial encounter: Secondary | ICD-10-CM | POA: Insufficient documentation

## 2020-05-04 DIAGNOSIS — Z7982 Long term (current) use of aspirin: Secondary | ICD-10-CM | POA: Diagnosis not present

## 2020-05-04 MED ORDER — AMOXICILLIN-POT CLAVULANATE 875-125 MG PO TABS: 1.0000 | ORAL_TABLET | Freq: Two times a day (BID) | ORAL | 0 refills | Status: DC

## 2020-05-04 MED FILL — AMOX-CLAV 875-125 MG TABLET: 875-125 | 7 days supply | Qty: 14 | Fill #0

## 2020-05-04 NOTE — ED Provider Notes (Signed)
[facility] EMERGENCY DEPARTMENT Provider Note   CSN: [id number] Arrival date & time: 05/04/20  1455     History Chief Complaint  Patient presents with  . Animal Bite    Stephen Dunlap is a 49 y.o. male.  HPI Patient is a 49 year old male with a medical history as noted below.  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  He reports multiple small, linear, well approximated lacerations to the left hand with no active bleeding.  Denies any significant pain along the sides.  States that its owner confirms that the dog is up-to-date on its vaccinations.  He has no physical complaints at this time.  No numbness, tingling, weakness.    Past Medical History:  Diagnosis Date  . Diabetes mellitus   . Hypertension   . Morbid obesity ([place])   . Prostatitis   . Sleep apnea    CPAP machine, 4-10 pressure settings  . TIA (transient ischemic attack)    October 2018, possible?  . TIA (transient ischemic attack)   . UTI (urinary tract infection)     Patient Active Problem List   Diagnosis Date Noted  . Cervical radiculopathy 02/08/2018  . TIA (transient ischemic attack) 04/29/2017  . Acute bronchitis 03/02/2015  . Wheezing 03/02/2015  . Cough 02/26/2015  . Pneumonia, organism unspecified(486) 02/26/2015  . Shift work sleep disorder 03/03/2014  . GERD (gastroesophageal reflux disease) 06/22/2013  . Depression, acute 06/22/2013  . Allergic rhinitis, cause unspecified 12/15/2012  . Routine general medical examination at a health care facility 12/02/2012  . OSA (obstructive sleep apnea) 11/17/2011  . Hyperlipidemia with target LDL less than 100 10/31/2010  . Diabetes mellitus type 2 with complications ([place]) 84/53/6468  . OBESITY, MORBID 06/10/2010  . Essential hypertension 06/10/2010    Past Surgical History:  Procedure Laterality Date  . ANTERIOR CERVICAL DECOMP/DISCECTOMY FUSION N/A 02/08/2018   Procedure: Anterior  Cervical Decompression Fusion - Cervical Six-Cervical Seven - Cervical Seven -Thoracic One;  Surgeon: Earnie Larsson, MD;  Location: [facility];  Service: Neurosurgery;  Laterality: N/A;  . cysto bedside    . HERNIA REPAIR    . WISDOM TOOTH EXTRACTION         Family History  Problem Relation Age of Onset  . Arthritis Other   . Cancer Other        breast  . Diabetes Other   . Hypertension Other   . Hyperlipidemia Other     Social History   Tobacco Use  . Smoking status: Never Smoker  . Smokeless tobacco: Never Used  Vaping Use  . Vaping Use: Never used  Substance Use Topics  . Alcohol use: Yes    Alcohol/week: 2.0 standard drinks    Types: 2 Cans of beer per week    Comment: Socially, not weekly  . Drug use: No    Home Medications Prior to Admission medications   Medication Sig Start Date End Date Taking? Authorizing Provider  albuterol (PROVENTIL HFA;VENTOLIN HFA) 108 (90 BASE) MCG/ACT inhaler Inhale 2 puffs into the lungs every 6 (six) hours as needed for wheezing or shortness of breath. 03/02/15   Biagio Borg, MD  aspirin EC 81 MG tablet Take 81 mg by mouth daily.    [provider]  clonazePAM (KLONOPIN) 1 MG tablet Take 1 mg by mouth at bedtime.     [provider]  empagliflozin (JARDIANCE) 25 MG TABS tablet Take 25 mg  by mouth daily.    [provider]  EPINEPHrine (AUVI-Q) 0.3 mg/0.3 mL IJ SOAJ injection Inject 0.3 mg into the muscle once.    [provider]  fluticasone (FLONASE) 50 MCG/ACT nasal spray Place 1 spray into both nostrils daily as needed for allergies or rhinitis.    [provider]  HYDROcodone-acetaminophen (NORCO/VICODIN) 5-325 MG tablet Take 1-2 tablets by mouth every 4 (four) hours as needed for moderate pain ((score 4 to 6)). 02/09/18   Earnie Larsson, MD  ibuprofen (ADVIL,MOTRIN) 800 MG tablet Take 800 mg by mouth every 8 (eight) hours as needed for mild pain or moderate pain.     [provider]    JANUMET 50-1000 MG tablet Take 1 tablet by mouth 2 (two) times daily with a meal.  11/28/16   [provider]  montelukast (SINGULAIR) 10 MG tablet TAKE 1 TABLET BY MOUTH EACH NIGHT AT BEDTIME 12/25/17   Kozlow, Donnamarie Poag, MD  olopatadine (PATANOL) 0.1 % ophthalmic solution INSTILL 1 DROP INTO BOTH EYES DAILY Patient taking differently: INSTILL 1 DROP INTO BOTH EYES DAILY AS NEEDED FOR ALLERGIES 12/25/17   Kozlow, Donnamarie Poag, MD  omeprazole (PRILOSEC) 20 MG capsule Take 1 capsule (20 mg total) by mouth daily. Patient taking differently: Take 20 mg by mouth daily as needed (acid reflux).  02/26/15   Janith Lima, MD  TRULICITY 1.5 AY/3.0ZS SOPN Inject 1.5 mg as directed every Monday. 01/11/18   [provider]    Allergies    Other and Invokana [canagliflozin]  Review of Systems   Review of Systems  Musculoskeletal: Positive for myalgias.  Skin: Positive for wound.  Neurological: Negative for weakness and numbness.   Physical Exam Updated Vital Signs BP (!) 149/98 (BP Location: Right Arm)   Pulse 98   Temp 99.2 F (37.3 C) (Oral)   Resp 18   Ht 6\' 5"  (1.956 m)   Wt (!) 179.6 kg   SpO2 98%   BMI 46.96 kg/m   Physical Exam Vitals and nursing note reviewed.  Constitutional:      General: He is not in acute distress.    Appearance: Normal appearance. He is not ill-appearing, toxic-appearing or diaphoretic.  HENT:     Head: Normocephalic and atraumatic.     Right Ear: External ear normal.     Left Ear: External ear normal.     Nose: Nose normal.     Mouth/Throat:     Mouth: Mucous membranes are moist.     Pharynx: Oropharynx is clear. No oropharyngeal exudate or posterior oropharyngeal erythema.  Eyes:     Extraocular Movements: Extraocular movements intact.  Cardiovascular:     Rate and Rhythm: Normal rate.     Pulses: Normal pulses.  Pulmonary:     Effort: Pulmonary effort is normal.  Abdominal:     General: Abdomen is flat.     Tenderness: There is no  abdominal tenderness.  Musculoskeletal:        General: Normal range of motion.     Cervical back: Normal range of motion and neck supple. No tenderness.  Skin:    General: Skin is warm and dry.     Findings: Wound present.     Comments: Multiple small puncture wounds with no active bleeding noted to the dorsum of the left hand and thumb.  Additional small puncture wound with no active bleeding noted to the palmar aspect of the left hand.  Minimal tenderness overlying the sites.  Distal  sensation intact.  Good cap refill.  2+ radial pulses.  Full range of motion of the fingers of the left hand.  Neurological:     General: No focal deficit present.     Mental Status: He is alert and oriented to person, place, and time.  Psychiatric:        Mood and Affect: Mood normal.        Behavior: Behavior normal.    ED Results / Procedures / Treatments   Labs (all labs ordered are listed, but only abnormal results are displayed) Labs Reviewed - No data to display  EKG None  Radiology DG Hand 2 View Left  Result Date: 05/04/2020 CLINICAL DATA:  Dog bite to left hand.  Rule out foreign body. EXAM: LEFT HAND - 2 VIEW COMPARISON:  None. FINDINGS: There is no evidence of fracture or dislocation. There is no evidence of arthropathy or other focal bone abnormality. Soft tissues are unremarkable. IMPRESSION: Negative. Electronically Signed   By: Marin Olp M.D.   On: 05/04/2020 16:21   Procedures Procedures (including critical care time)  Medications Ordered in ED Medications - No data to display  ED Course  I have reviewed the triage vital signs and the nursing notes.  Pertinent labs & imaging results that were available during my care of the patient were reviewed by me and considered in my medical decision making (see chart for details).    MDM Rules/Calculators/A&P                          Pt is a 49 y.o. male that presents with a history, physical exam, and ED Clinical Course as noted  above.   Patient presents today due to a dog bite of the left hand.  States that he knows the owner personally and that the dog's vaccinations are up-to-date.  His physical exam today was reassuring.  He has multiple small puncture wounds with no active bleeding and minimal tenderness.  DG obtained of the left hand which was negative.   We will discharge patient on a course of Augmentin.  He understands not to stop taking his antibiotics early.  Discussed wound care with the patient.  Recommended that he follow-up with the dogs owner if he is concerned regarding the vaccination status of the dog.  We discussed the risks associated with the development of rabies.  His questions were answered and he was amicable at the time of discharge.  His vital signs are stable.   An After Visit Summary was printed and given to the patient.  Patient discharged to home/self care.  Condition at discharge: Stable  Note: Portions of this report may have been transcribed using voice recognition software. Every effort was made to ensure accuracy; however, inadvertent computerized transcription errors may be present.   Final Clinical Impression(s) / ED Diagnoses Final diagnoses:  Dog bite, initial encounter   Rx / DC Orders ED Discharge Orders         Ordered    amoxicillin-clavulanate (AUGMENTIN) 875-125 MG tablet  2 times daily        05/04/20 1638           Rayna Sexton, PA-C 05/04/20 1651    Quintella Reichert, MD 05/04/20 2012

## 2020-05-04 NOTE — Discharge Instructions (Addendum)
I prescribed you an antibiotic called Augmentin.  You are going to take this twice a day for the next 7 days.  Please do not stop taking this early.  This will help prevent infection in your left hand.  If you develop any new or worsening symptoms you can always return to the ER for reevaluation.  Please make sure you follow-up with the dog's owner to confirm that its immunizations are up-to-date.  If there is any concern, you should be vaccinated for rabies.  You can return to the ER to have this done.  It was a pleasure to meet you.

## 2020-05-04 NOTE — ED Triage Notes (Signed)
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 Dog is UTD on rabies vaccine

## 2020-05-07 ENCOUNTER — Other Ambulatory Visit: Payer: 59

## 2020-05-08 ENCOUNTER — Ambulatory Visit: Payer: 59 | Admitting: Thoracic Surgery (Cardiothoracic Vascular Surgery)

## 2020-05-10 DIAGNOSIS — M21611 Bunion of right foot: Secondary | ICD-10-CM | POA: Diagnosis not present

## 2020-05-10 DIAGNOSIS — E139 Other specified diabetes mellitus without complications: Secondary | ICD-10-CM | POA: Diagnosis not present

## 2020-05-10 DIAGNOSIS — M2042 Other hammer toe(s) (acquired), left foot: Secondary | ICD-10-CM | POA: Diagnosis not present

## 2020-05-10 DIAGNOSIS — M21612 Bunion of left foot: Secondary | ICD-10-CM | POA: Diagnosis not present

## 2020-05-15 DIAGNOSIS — D3132 Benign neoplasm of left choroid: Secondary | ICD-10-CM | POA: Diagnosis not present

## 2020-05-15 DIAGNOSIS — H10413 Chronic giant papillary conjunctivitis, bilateral: Secondary | ICD-10-CM | POA: Diagnosis not present

## 2020-05-15 DIAGNOSIS — H2513 Age-related nuclear cataract, bilateral: Secondary | ICD-10-CM | POA: Diagnosis not present

## 2020-05-15 DIAGNOSIS — H40013 Open angle with borderline findings, low risk, bilateral: Secondary | ICD-10-CM | POA: Diagnosis not present

## 2020-05-15 DIAGNOSIS — H04123 Dry eye syndrome of bilateral lacrimal glands: Secondary | ICD-10-CM | POA: Diagnosis not present

## 2020-05-15 DIAGNOSIS — E113292 Type 2 diabetes mellitus with mild nonproliferative diabetic retinopathy without macular edema, left eye: Secondary | ICD-10-CM | POA: Diagnosis not present

## 2020-05-18 ENCOUNTER — Ambulatory Visit
Admission: RE | Admit: 2020-05-18 | Discharge: 2020-05-18 | Disposition: A | Discharge status: Home / Self Care | Payer: 59 | Source: Ambulatory Visit | Attending: Thoracic Surgery (Cardiothoracic Vascular Surgery) | Admitting: Thoracic Surgery (Cardiothoracic Vascular Surgery)

## 2020-05-18 DIAGNOSIS — I712 Thoracic aortic aneurysm, without rupture, unspecified: Secondary | ICD-10-CM

## 2020-05-18 MED ORDER — IOPAMIDOL (ISOVUE-370) INJECTION 76%
75.0000 mL | Freq: Once | INTRAVENOUS | Status: AC | PRN
  Administered 2020-05-18: 75 mL via INTRAVENOUS

## 2020-05-29 ENCOUNTER — Encounter: Payer: Self-pay | Admitting: Thoracic Surgery (Cardiothoracic Vascular Surgery)

## 2020-05-29 ENCOUNTER — Other Ambulatory Visit: Payer: Self-pay

## 2020-05-29 ENCOUNTER — Ambulatory Visit (INDEPENDENT_AMBULATORY_CARE_PROVIDER_SITE_OTHER): Payer: 59 | Admitting: Thoracic Surgery (Cardiothoracic Vascular Surgery)

## 2020-05-29 VITALS — BP 132/88 | HR 90 | Resp 20 | Ht 77.0 in | Wt 395.0 lb

## 2020-05-29 DIAGNOSIS — I7121 Aneurysm of the ascending aorta, without rupture: Secondary | ICD-10-CM

## 2020-05-29 DIAGNOSIS — I712 Thoracic aortic aneurysm, without rupture: Secondary | ICD-10-CM | POA: Diagnosis not present

## 2020-05-29 NOTE — Progress Notes (Signed)
[address]       [place] [address]             [phone]     HPI: Mr. Nebel returns for follow-up of his ascending aneurysm  Zak Gondek is a 49 year old gentleman with a history of morbid obesity, hypertension, type 2 diabetes, asthma, sleep apnea, possible TIA, COVID-19 infection, and a borderline ascending aneurysm.  In November 2020 he was diagnosed with COVID-19.  As part of his work-up he had a chest x-ray which showed prominence of the ascending aorta.  On CT angiogram there was a 3.9cm ectatic ascending aorta.  There was significant curvature to the aorta.  He now returns for 1 year follow-up.  He has been feeling well.  He says his weight is around 370.  He was over 400 pounds at one time.  He is not having any chest pain, pressure, or tightness.  He did experience some back pain recently.  Felt like something "tore" in his back, so he came for his follow-up earlier than originally scheduled.  Past Medical History:  Diagnosis Date  . Diabetes mellitus   . Hypertension   . Morbid obesity ([place])   . Prostatitis   . Sleep apnea    CPAP machine, 4-10 pressure settings  . TIA (transient ischemic attack)    October 2018, possible?  . TIA (transient ischemic attack)   . UTI (urinary tract infection)     Current Outpatient Medications  Medication Sig Dispense Refill  . albuterol (PROVENTIL HFA;VENTOLIN HFA) 108 (90 BASE) MCG/ACT inhaler Inhale 2 puffs into the lungs every 6 (six) hours as needed for wheezing or shortness of breath. 1 Inhaler 2  . aspirin EC 81 MG tablet Take 81 mg by mouth daily.    . clonazePAM (KLONOPIN) 1 MG tablet Take 1 mg by mouth at bedtime.     . empagliflozin (JARDIANCE) 25 MG TABS tablet Take 25 mg by mouth daily.    Marland Kitchen EPINEPHrine (AUVI-Q) 0.3 mg/0.3 mL IJ SOAJ injection Inject 0.3 mg into the muscle once.    . fluticasone (FLONASE) 50 MCG/ACT nasal spray Place 1 spray into both nostrils daily as needed for allergies or  rhinitis.    Marland Kitchen HYDROcodone-acetaminophen (NORCO/VICODIN) 5-325 MG tablet Take 1-2 tablets by mouth every 4 (four) hours as needed for moderate pain ((score 4 to 6)). 50 tablet 0  . ibuprofen (ADVIL,MOTRIN) 800 MG tablet Take 800 mg by mouth every 8 (eight) hours as needed for mild pain or moderate pain.     Marland Kitchen JANUMET 50-1000 MG tablet Take 1 tablet by mouth 2 (two) times daily with a meal.   7  . montelukast (SINGULAIR) 10 MG tablet TAKE 1 TABLET BY MOUTH EACH NIGHT AT BEDTIME 30 tablet 0  . olopatadine (PATANOL) 0.1 % ophthalmic solution INSTILL 1 DROP INTO BOTH EYES DAILY (Patient taking differently: INSTILL 1 DROP INTO BOTH EYES DAILY AS NEEDED FOR ALLERGIES) 5 mL 0  . omeprazole (PRILOSEC) 20 MG capsule Take 1 capsule (20 mg total) by mouth daily. (Patient taking differently: Take 20 mg by mouth daily as needed (acid reflux). ) 90 capsule 3  . TRULICITY 1.5 DX/8.3JA SOPN Inject 1.5 mg as directed every Monday.  5   No current facility-administered medications for this visit.    Physical Exam BP 132/88   Pulse 90   Resp 20   Ht 6\' 5"  (1.956 m)   Wt (!) 395 lb (179.2 kg)  SpO2 96% Comment: RA  BMI 46.71 kg/m  49 year old man in no acute distress Obese Alert and oriented x3 with no focal deficits No carotid bruits Cardiac regular rate and rhythm, no murmur Lungs clear Trace edema left leg  Diagnostic Tests: CT ANGIOGRAPHY CHEST WITH CONTRAST  TECHNIQUE: Multidetector CT imaging of the chest was performed using the standard protocol during bolus administration of intravenous contrast. Multiplanar CT image reconstructions and MIPs were obtained to evaluate the vascular anatomy.  CONTRAST:  55mL ISOVUE-370 IOPAMIDOL (ISOVUE-370) INJECTION 76%  COMPARISON:  June 11, 2019.  FINDINGS: Cardiovascular: 4 cm ascending thoracic aortic aneurysm is noted. No dissection is noted. Great vessels are widely patent without stenosis. Transverse aortic arch measures 2.8 cm.  Proximal descending thoracic aorta measures 3.1 cm. Normal cardiac size. No pericardial effusion.  Mediastinum/Nodes: No enlarged mediastinal, hilar, or axillary lymph nodes. Thyroid gland, trachea, and esophagus demonstrate no significant findings.  Lungs/Pleura: Lungs are clear. No pleural effusion or pneumothorax.  Upper Abdomen: No acute abnormality.  Musculoskeletal: No chest wall abnormality. No acute or significant osseous findings.  Review of the MIP images confirms the above findings.  IMPRESSION: 1. 4 cm ascending thoracic aortic aneurysm. Recommend annual imaging followup by CTA or MRA. This recommendation follows 2010 ACCF/AHA/AATS/ACR/ASA/SCA/SCAI/SIR/STS/SVM Guidelines for the Diagnosis and Management of Patients with Thoracic Aortic Disease. Circulation. 2010; 121: Y924-M628. Aortic aneurysm NOS (ICD10-I71.9).   Electronically Signed   By: Marijo Conception M.D.   On: 05/18/2020 15:11  I personally reviewed the CT images and concur with the findings noted above  Impression: Littleton Haub is a very nice 49 year old gentleman with a past history significant for morbid obesity, hypertension, type 2 diabetes, asthma, sleep apnea, possible TIA, COVID-19 infection, and an ascending aneurysm.  Ascending aneurysm-stable at 4 cm.  Does have some significant curvature.  He is a massive man at 70 and about 370+ pounds.  Therefore, aorta not as impressive as it would be in someone of smaller stature.  Still needs continued annual follow-up.  Importance of blood pressure control was emphasized.  His blood pressure is normal.  He is not currently on any medications.  Diabetes-managed by Dr. Forde Dandy.  Well controlled with recent A1c the mid 6 range.  Obesity-importance of weight loss and exercise were emphasized for overall health.  Plan: Return in 1 year with CT angiogram of chest Regular exercise, weight loss  I spent over 20 minutes in review of images, records,  and in consultation with Mr. Dack today. Melrose Nakayama, MD [facility] [phone]

## 2020-05-30 DIAGNOSIS — M7061 Trochanteric bursitis, right hip: Secondary | ICD-10-CM | POA: Diagnosis not present

## 2020-05-30 DIAGNOSIS — M25551 Pain in right hip: Secondary | ICD-10-CM | POA: Diagnosis not present

## 2020-05-30 DIAGNOSIS — Z6841 Body Mass Index (BMI) 40.0 and over, adult: Secondary | ICD-10-CM | POA: Diagnosis not present

## 2020-05-31 ENCOUNTER — Other Ambulatory Visit (HOSPITAL_COMMUNITY): Payer: Self-pay | Admitting: Student

## 2020-05-31 DIAGNOSIS — Z981 Arthrodesis status: Secondary | ICD-10-CM | POA: Diagnosis not present

## 2020-05-31 DIAGNOSIS — M5412 Radiculopathy, cervical region: Secondary | ICD-10-CM | POA: Diagnosis not present

## 2020-05-31 MED FILL — PREGABALIN 75 MG CAPS: 75 | 30 days supply | Qty: 60 | Fill #0

## 2020-06-01 MED FILL — TRULICITY 3 MG/0.5ML SOPN: 3 | 28 days supply | Qty: 2 | Fill #1

## 2020-06-06 DIAGNOSIS — H35031 Hypertensive retinopathy, right eye: Secondary | ICD-10-CM | POA: Diagnosis not present

## 2020-06-06 DIAGNOSIS — D3132 Benign neoplasm of left choroid: Secondary | ICD-10-CM | POA: Diagnosis not present

## 2020-06-06 DIAGNOSIS — H2513 Age-related nuclear cataract, bilateral: Secondary | ICD-10-CM | POA: Diagnosis not present

## 2020-06-06 DIAGNOSIS — E113293 Type 2 diabetes mellitus with mild nonproliferative diabetic retinopathy without macular edema, bilateral: Secondary | ICD-10-CM | POA: Diagnosis not present

## 2020-06-13 DIAGNOSIS — M21612 Bunion of left foot: Secondary | ICD-10-CM | POA: Diagnosis not present

## 2020-06-13 DIAGNOSIS — M2042 Other hammer toe(s) (acquired), left foot: Secondary | ICD-10-CM | POA: Diagnosis not present

## 2020-06-13 DIAGNOSIS — M205X2 Other deformities of toe(s) (acquired), left foot: Secondary | ICD-10-CM | POA: Diagnosis not present

## 2020-06-13 DIAGNOSIS — E139 Other specified diabetes mellitus without complications: Secondary | ICD-10-CM | POA: Diagnosis not present

## 2020-06-14 ENCOUNTER — Other Ambulatory Visit: Payer: Self-pay

## 2020-06-14 ENCOUNTER — Encounter (HOSPITAL_BASED_OUTPATIENT_CLINIC_OR_DEPARTMENT_OTHER): Payer: Self-pay | Admitting: Podiatry

## 2020-06-14 DIAGNOSIS — M5412 Radiculopathy, cervical region: Secondary | ICD-10-CM | POA: Diagnosis not present

## 2020-06-14 NOTE — Progress Notes (Signed)
Spoke w/ via phone for pre-op interview---pt Lab needs dos----none, has lab appointment 06-20-2020 1300 pm for cbc, bmet, ekg and anesthesia airway evaluation               Lab results------ct angio chest 05-18-2020, lov dr hendrickson thoracic 05-29-2020 epic (pt has 4 cm ascending thoracic aortic aneurysm), echo 04-30-2017 epic COVID test ------06-22-2020 845 Arrive at -------845 am 06-16-2020 NPO after MN NO Solid Food.  Clear liquids from MN until---745 am then npo Medications to take morning of surgery -----albuterol inhaler prn/bring inhaler Diabetic medication -----none day of surgery Patient Special Instructions -----bring cpap mask tubing and machine and leave in car Pre-Op special Istructions -----none Patient verbalized understanding of instructions that were given at this phone interview. Patient denies shortness of breath, chest pain, fever, cough at this phone interview.

## 2020-06-15 ENCOUNTER — Other Ambulatory Visit (HOSPITAL_COMMUNITY): Payer: Self-pay | Admitting: Podiatry

## 2020-06-15 MED FILL — OXYCODONE-APAP 5-325MG: 5-325 | 4 days supply | Qty: 24 | Fill #0

## 2020-06-18 NOTE — Progress Notes (Signed)
Spoke with morgan at dr Fritzi Mandes office and requested h and p from dr Annie Main [facility] for 06-25-2020 surgery

## 2020-06-19 NOTE — Progress Notes (Signed)
Spoke with Stephen Dunlap at dr Levy Sjogren per Jonna Coup   still waiting in h and P for 06-25-2020 surgery

## 2020-06-20 ENCOUNTER — Other Ambulatory Visit: Payer: Self-pay

## 2020-06-20 ENCOUNTER — Encounter (HOSPITAL_COMMUNITY)
Admission: RE | Admit: 2020-06-20 | Discharge: 2020-06-20 | Disposition: A | Discharge status: Home / Self Care | Payer: 59 | Source: Ambulatory Visit | Attending: Podiatry | Admitting: Podiatry

## 2020-06-20 DIAGNOSIS — E118 Type 2 diabetes mellitus with unspecified complications: Secondary | ICD-10-CM | POA: Insufficient documentation

## 2020-06-20 DIAGNOSIS — Z01818 Encounter for other preprocedural examination: Secondary | ICD-10-CM | POA: Diagnosis not present

## 2020-06-20 LAB — CBC
HCT: 45.7 % (ref 39.0–52.0)
Hemoglobin: 14.9 g/dL (ref 13.0–17.0)
MCH: 28.5 pg (ref 26.0–34.0)
MCHC: 32.6 g/dL (ref 30.0–36.0)
MCV: 87.5 fL (ref 80.0–100.0)
Platelets: 227 10*3/uL (ref 150–400)
RBC: 5.22 MIL/uL (ref 4.22–5.81)
RDW: 13.1 % (ref 11.5–15.5)
WBC: 7.3 10*3/uL (ref 4.0–10.5)
nRBC: 0 % (ref 0.0–0.2)

## 2020-06-20 LAB — BASIC METABOLIC PANEL
Anion gap: 11 (ref 5–15)
BUN: 14 mg/dL (ref 6–20)
CO2: 23 mmol/L (ref 22–32)
Calcium: 9.2 mg/dL (ref 8.9–10.3)
Chloride: 106 mmol/L (ref 98–111)
Creatinine, Ser: 0.88 mg/dL (ref 0.61–1.24)
GFR, Estimated: >60 mL/min (ref >60)
Glucose, Bld: 156 mg/dL — ABNORMAL HIGH (ref 70–99)
Potassium: 4.1 mmol/L (ref 3.5–5.1)
Sodium: 140 mmol/L (ref 135–145)

## 2020-06-20 NOTE — Progress Notes (Signed)
H and P dr Reynold Bowen dated 06-19-2020 received by fax and placed on chart. Patient meets [facility] guidelines per Janett Billow zanetto pa

## 2020-06-22 ENCOUNTER — Other Ambulatory Visit (HOSPITAL_COMMUNITY)
Admission: RE | Admit: 2020-06-22 | Discharge: 2020-06-22 | Disposition: A | Discharge status: Home / Self Care | Payer: 59 | Source: Ambulatory Visit | Attending: Podiatry | Admitting: Podiatry

## 2020-06-22 DIAGNOSIS — Z20822 Contact with and (suspected) exposure to covid-19: Secondary | ICD-10-CM | POA: Insufficient documentation

## 2020-06-22 DIAGNOSIS — Z01818 Encounter for other preprocedural examination: Secondary | ICD-10-CM | POA: Insufficient documentation

## 2020-06-22 LAB — SARS CORONAVIRUS 2 (TAT 6-24 HRS): SARS Coronavirus 2: NEGATIVE

## 2020-06-25 ENCOUNTER — Ambulatory Visit (HOSPITAL_BASED_OUTPATIENT_CLINIC_OR_DEPARTMENT_OTHER): Payer: 59 | Admitting: Anesthesiology

## 2020-06-25 ENCOUNTER — Other Ambulatory Visit: Payer: Self-pay

## 2020-06-25 ENCOUNTER — Ambulatory Visit (HOSPITAL_BASED_OUTPATIENT_CLINIC_OR_DEPARTMENT_OTHER): Payer: 59 | Admitting: Physician Assistant

## 2020-06-25 ENCOUNTER — Ambulatory Visit (HOSPITAL_BASED_OUTPATIENT_CLINIC_OR_DEPARTMENT_OTHER)
Admission: RE | Admit: 2020-06-25 | Discharge: 2020-06-25 | Disposition: A | Discharge status: Home / Self Care | Payer: 59 | Attending: Podiatry | Admitting: Podiatry

## 2020-06-25 ENCOUNTER — Encounter (HOSPITAL_BASED_OUTPATIENT_CLINIC_OR_DEPARTMENT_OTHER)
Admission: RE | Disposition: A | Discharge status: Home / Self Care | Payer: Self-pay | Source: Home / Self Care | Attending: Podiatry

## 2020-06-25 ENCOUNTER — Encounter (HOSPITAL_BASED_OUTPATIENT_CLINIC_OR_DEPARTMENT_OTHER): Payer: Self-pay | Admitting: Podiatry

## 2020-06-25 DIAGNOSIS — Z8616 Personal history of COVID-19: Secondary | ICD-10-CM | POA: Diagnosis not present

## 2020-06-25 DIAGNOSIS — Z79899 Other long term (current) drug therapy: Secondary | ICD-10-CM | POA: Insufficient documentation

## 2020-06-25 DIAGNOSIS — Z8673 Personal history of transient ischemic attack (TIA), and cerebral infarction without residual deficits: Secondary | ICD-10-CM | POA: Insufficient documentation

## 2020-06-25 DIAGNOSIS — E1142 Type 2 diabetes mellitus with diabetic polyneuropathy: Secondary | ICD-10-CM | POA: Diagnosis not present

## 2020-06-25 DIAGNOSIS — M21612 Bunion of left foot: Secondary | ICD-10-CM | POA: Diagnosis not present

## 2020-06-25 DIAGNOSIS — Z7982 Long term (current) use of aspirin: Secondary | ICD-10-CM | POA: Insufficient documentation

## 2020-06-25 DIAGNOSIS — F329 Major depressive disorder, single episode, unspecified: Secondary | ICD-10-CM | POA: Diagnosis not present

## 2020-06-25 DIAGNOSIS — I10 Essential (primary) hypertension: Secondary | ICD-10-CM | POA: Diagnosis not present

## 2020-06-25 DIAGNOSIS — M2012 Hallux valgus (acquired), left foot: Secondary | ICD-10-CM | POA: Insufficient documentation

## 2020-06-25 DIAGNOSIS — E785 Hyperlipidemia, unspecified: Secondary | ICD-10-CM | POA: Diagnosis not present

## 2020-06-25 DIAGNOSIS — I712 Thoracic aortic aneurysm, without rupture: Secondary | ICD-10-CM | POA: Diagnosis not present

## 2020-06-25 DIAGNOSIS — M205X2 Other deformities of toe(s) (acquired), left foot: Secondary | ICD-10-CM | POA: Insufficient documentation

## 2020-06-25 DIAGNOSIS — M2042 Other hammer toe(s) (acquired), left foot: Secondary | ICD-10-CM | POA: Insufficient documentation

## 2020-06-25 DIAGNOSIS — G4733 Obstructive sleep apnea (adult) (pediatric): Secondary | ICD-10-CM | POA: Diagnosis not present

## 2020-06-25 DIAGNOSIS — K760 Fatty (change of) liver, not elsewhere classified: Secondary | ICD-10-CM | POA: Insufficient documentation

## 2020-06-25 DIAGNOSIS — K219 Gastro-esophageal reflux disease without esophagitis: Secondary | ICD-10-CM | POA: Diagnosis not present

## 2020-06-25 DIAGNOSIS — Z7984 Long term (current) use of oral hypoglycemic drugs: Secondary | ICD-10-CM | POA: Insufficient documentation

## 2020-06-25 DIAGNOSIS — E119 Type 2 diabetes mellitus without complications: Secondary | ICD-10-CM | POA: Diagnosis not present

## 2020-06-25 HISTORY — DX: Type 2 diabetes mellitus without complications: E11.9

## 2020-06-25 HISTORY — PX: BUNIONECTOMY: SHX129

## 2020-06-25 HISTORY — DX: Thoracic aortic aneurysm, without rupture: I71.2

## 2020-06-25 HISTORY — PX: HAMMER TOE SURGERY: SHX385

## 2020-06-25 HISTORY — DX: Aneurysm of the ascending aorta, without rupture: I71.21

## 2020-06-25 LAB — GLUCOSE, CAPILLARY
Glucose-Capillary: 138 mg/dL — ABNORMAL HIGH (ref 70–99)
Glucose-Capillary: 185 mg/dL — ABNORMAL HIGH (ref 70–99)

## 2020-06-25 SURGERY — BUNIONECTOMY
Anesthesia: General | Site: Toe | Laterality: Left

## 2020-06-25 MED ORDER — LIDOCAINE HCL (PF) 2 % IJ SOLN
INTRAMUSCULAR | Status: AC
  Filled 2020-06-25: qty 5

## 2020-06-25 MED ORDER — FENTANYL CITRATE (PF) 100 MCG/2ML IJ SOLN
INTRAMUSCULAR | Status: AC
  Filled 2020-06-25: qty 2

## 2020-06-25 MED ORDER — PROPOFOL 10 MG/ML IV BOLUS
INTRAVENOUS | Status: AC
  Filled 2020-06-25: qty 20

## 2020-06-25 MED ORDER — DEXMEDETOMIDINE HCL 200 MCG/2ML IV SOLN
INTRAVENOUS | Status: DC | PRN
  Administered 2020-06-25 (×3): 4 ug via INTRAVENOUS

## 2020-06-25 MED ORDER — LIDOCAINE HCL (CARDIAC) PF 100 MG/5ML IV SOSY
PREFILLED_SYRINGE | INTRAVENOUS | Status: DC | PRN
  Administered 2020-06-25: 100 mg via INTRAVENOUS

## 2020-06-25 MED ORDER — BUPIVACAINE LIPOSOME 1.3 % IJ SUSP
INTRAMUSCULAR | Status: DC | PRN
  Administered 2020-06-25: 20 mL

## 2020-06-25 MED ORDER — SUCCINYLCHOLINE CHLORIDE 200 MG/10ML IV SOSY
PREFILLED_SYRINGE | INTRAVENOUS | Status: AC
  Filled 2020-06-25: qty 10

## 2020-06-25 MED ORDER — KETOROLAC TROMETHAMINE 30 MG/ML IJ SOLN
INTRAMUSCULAR | Status: AC
  Filled 2020-06-25: qty 1

## 2020-06-25 MED ORDER — SODIUM CHLORIDE 0.9 % IV SOLN: 250.0000 mL | INTRAVENOUS | Status: DC | PRN

## 2020-06-25 MED ORDER — OXYCODONE HCL 5 MG/5ML PO SOLN: 5.0000 mg | Freq: Once | ORAL | Status: DC | PRN

## 2020-06-25 MED ORDER — PROMETHAZINE HCL 25 MG PO TABS: 12.5000 mg | ORAL_TABLET | ORAL | Status: DC | PRN

## 2020-06-25 MED ORDER — DEXAMETHASONE SODIUM PHOSPHATE 10 MG/ML IJ SOLN
INTRAMUSCULAR | Status: AC
  Filled 2020-06-25: qty 1

## 2020-06-25 MED ORDER — ROCURONIUM BROMIDE 10 MG/ML (PF) SYRINGE
PREFILLED_SYRINGE | INTRAVENOUS | Status: AC
  Filled 2020-06-25: qty 10

## 2020-06-25 MED ORDER — OXYCODONE HCL 5 MG PO TABS: 5.0000 mg | ORAL_TABLET | Freq: Once | ORAL | Status: DC | PRN

## 2020-06-25 MED ORDER — DEXMEDETOMIDINE (PRECEDEX) IN NS 20 MCG/5ML (4 MCG/ML) IV SYRINGE
PREFILLED_SYRINGE | INTRAVENOUS | Status: AC
  Filled 2020-06-25: qty 5

## 2020-06-25 MED ORDER — DEXTROSE 5 % IV SOLN
3.0000 g | INTRAVENOUS | Status: AC
  Administered 2020-06-25: 3 g via INTRAVENOUS
  Filled 2020-06-25: qty 3

## 2020-06-25 MED ORDER — LIDOCAINE HCL 2 % IJ SOLN
INTRAMUSCULAR | Status: DC | PRN
  Administered 2020-06-25: 7.5 mL

## 2020-06-25 MED ORDER — SUCCINYLCHOLINE CHLORIDE 20 MG/ML IJ SOLN
INTRAMUSCULAR | Status: DC | PRN
  Administered 2020-06-25: 200 mg via INTRAVENOUS

## 2020-06-25 MED ORDER — DEXAMETHASONE SODIUM PHOSPHATE 4 MG/ML IJ SOLN
INTRAMUSCULAR | Status: DC | PRN
  Administered 2020-06-25: 10 mg via INTRAVENOUS

## 2020-06-25 MED ORDER — SUGAMMADEX SODIUM 200 MG/2ML IV SOLN
INTRAVENOUS | Status: DC | PRN
  Administered 2020-06-25: 200 mg via INTRAVENOUS

## 2020-06-25 MED ORDER — ACETAMINOPHEN 500 MG PO TABS
1000.0000 mg | ORAL_TABLET | Freq: Once | ORAL | Status: AC
  Administered 2020-06-25: 1000 mg via ORAL

## 2020-06-25 MED ORDER — ONDANSETRON HCL 4 MG/2ML IJ SOLN: 4.0000 mg | Freq: Once | INTRAMUSCULAR | Status: DC | PRN

## 2020-06-25 MED ORDER — ROCURONIUM BROMIDE 100 MG/10ML IV SOLN
INTRAVENOUS | Status: DC | PRN
  Administered 2020-06-25: 60 mg via INTRAVENOUS

## 2020-06-25 MED ORDER — LACTATED RINGERS IV SOLN: INTRAVENOUS | Status: DC

## 2020-06-25 MED ORDER — CEFAZOLIN SODIUM-DEXTROSE 2-4 GM/100ML-% IV SOLN
INTRAVENOUS | Status: AC
  Filled 2020-06-25: qty 100

## 2020-06-25 MED ORDER — MIDAZOLAM HCL 2 MG/2ML IJ SOLN
INTRAMUSCULAR | Status: AC
  Filled 2020-06-25: qty 2

## 2020-06-25 MED ORDER — KETOROLAC TROMETHAMINE 30 MG/ML IJ SOLN
INTRAMUSCULAR | Status: DC | PRN
  Administered 2020-06-25: 30 mg via INTRAVENOUS

## 2020-06-25 MED ORDER — FENTANYL CITRATE (PF) 100 MCG/2ML IJ SOLN: 25.0000 ug | INTRAMUSCULAR | Status: DC | PRN

## 2020-06-25 MED ORDER — OXYCODONE-ACETAMINOPHEN 5-325 MG PO TABS: 1.0000 | ORAL_TABLET | ORAL | Status: DC | PRN

## 2020-06-25 MED ORDER — ONDANSETRON HCL 4 MG/2ML IJ SOLN
INTRAMUSCULAR | Status: DC | PRN
  Administered 2020-06-25: 4 mg via INTRAVENOUS

## 2020-06-25 MED ORDER — BUPIVACAINE HCL 0.5 % IJ SOLN
INTRAMUSCULAR | Status: DC | PRN
  Administered 2020-06-25: 27.5 mL

## 2020-06-25 MED ORDER — CHLORHEXIDINE GLUCONATE CLOTH 2 % EX PADS: 6.0000 | MEDICATED_PAD | Freq: Once | CUTANEOUS | Status: DC

## 2020-06-25 MED ORDER — MIDAZOLAM HCL 5 MG/5ML IJ SOLN
INTRAMUSCULAR | Status: DC | PRN
  Administered 2020-06-25: 2 mg via INTRAVENOUS

## 2020-06-25 MED ORDER — ONDANSETRON HCL 4 MG/2ML IJ SOLN
INTRAMUSCULAR | Status: AC
  Filled 2020-06-25: qty 2

## 2020-06-25 MED ORDER — ACETAMINOPHEN 325 MG PO TABS: 650.0000 mg | ORAL_TABLET | ORAL | Status: DC | PRN

## 2020-06-25 MED ORDER — ACETAMINOPHEN 500 MG PO TABS
ORAL_TABLET | ORAL | Status: AC
  Filled 2020-06-25: qty 2

## 2020-06-25 MED ORDER — PROPOFOL 10 MG/ML IV BOLUS
INTRAVENOUS | Status: DC | PRN
  Administered 2020-06-25: 250 mg via INTRAVENOUS

## 2020-06-25 MED ORDER — CEFAZOLIN SODIUM-DEXTROSE 1-4 GM/50ML-% IV SOLN
INTRAVENOUS | Status: AC
  Filled 2020-06-25: qty 50

## 2020-06-25 MED ORDER — FENTANYL CITRATE (PF) 100 MCG/2ML IJ SOLN
INTRAMUSCULAR | Status: DC | PRN
  Administered 2020-06-25 (×4): 50 ug via INTRAVENOUS

## 2020-06-25 SURGICAL SUPPLY — 96 items
APL SKNCLS STERI-STRIP NONHPOA (GAUZE/BANDAGES/DRESSINGS) ×1
BAG DRN RND TRDRP ANRFLXCHMBR (UROLOGICAL SUPPLIES)
BAG URINE DRAIN 2000ML AR STRL (UROLOGICAL SUPPLIES) IMPLANT
BENZOIN TINCTURE PRP APPL 2/3 (GAUZE/BANDAGES/DRESSINGS) ×3 IMPLANT
BIT DRILL 2 (BIT) ×1 IMPLANT
BLADE AVERAGE 25X9 (BLADE) ×2 IMPLANT
BLADE MIC 41X13 (BLADE) ×1 IMPLANT
BLADE OSC/SAG .038X5.5 CUT EDG (BLADE) IMPLANT
BLADE OSC/SAGITTAL MD 5.5X18 (BLADE) ×3 IMPLANT
BLADE OSCILLATING SAW SHORT (MISCELLANEOUS) ×1 IMPLANT
BLADE SAW SAGI 13.0X70X1.19 (BLADE) IMPLANT
BLADE SURG 15 STRL LF DISP TIS (BLADE) ×4 IMPLANT
BLADE SURG 15 STRL SS (BLADE) ×8
BNDG CMPR 9X4 STRL LF SNTH (GAUZE/BANDAGES/DRESSINGS) ×1
BNDG COHESIVE 2X5 TAN STRL LF (GAUZE/BANDAGES/DRESSINGS) ×2 IMPLANT
BNDG COHESIVE 3X5 TAN STRL LF (GAUZE/BANDAGES/DRESSINGS) ×1 IMPLANT
BNDG COHESIVE 4X5 TAN NS LF (GAUZE/BANDAGES/DRESSINGS) ×1 IMPLANT
BNDG COHESIVE 4X5 TAN STRL (GAUZE/BANDAGES/DRESSINGS) ×2 IMPLANT
BNDG CONFORM 2 STRL LF (GAUZE/BANDAGES/DRESSINGS) ×2 IMPLANT
BNDG CONFORM 3 STRL LF (GAUZE/BANDAGES/DRESSINGS) ×1 IMPLANT
BNDG ELASTIC 6X5.8 VLCR STR LF (GAUZE/BANDAGES/DRESSINGS) ×2 IMPLANT
BNDG ESMARK 4X9 LF (GAUZE/BANDAGES/DRESSINGS) ×2 IMPLANT
CATH FOLEY 2WAY SLVR  5CC 16FR (CATHETERS)
CATH FOLEY 2WAY SLVR 5CC 16FR (CATHETERS) IMPLANT
CLIP EZ FIXATION 10X10X10 (Staple) ×1 IMPLANT
CNTNR URN SCR LID CUP LEK RST (MISCELLANEOUS) IMPLANT
CONT SPEC 4OZ STRL OR WHT (MISCELLANEOUS) ×2
COVER BACK TABLE 60X90IN (DRAPES) ×2 IMPLANT
COVER MAYO STAND STRL (DRAPES) ×2 IMPLANT
COVER SURGICAL LIGHT HANDLE (MISCELLANEOUS) ×3 IMPLANT
COVER WAND RF STERILE (DRAPES) ×2 IMPLANT
CUFF TOURN SGL QUICK 18X4 (TOURNIQUET CUFF) ×2 IMPLANT
DRAPE C-ARM 35X43 STRL (DRAPES) ×2 IMPLANT
DRAPE EXTREMITY T 121X128X90 (DISPOSABLE) ×2 IMPLANT
DRSG EMULSION OIL 3X3 NADH (GAUZE/BANDAGES/DRESSINGS) IMPLANT
DURAPREP 26ML APPLICATOR (WOUND CARE) ×2 IMPLANT
ELECT REM PT RETURN 9FT ADLT (ELECTROSURGICAL) ×2
ELECTRODE REM PT RTRN 9FT ADLT (ELECTROSURGICAL) ×1 IMPLANT
GAUZE SPONGE 4X4 12PLY STRL (GAUZE/BANDAGES/DRESSINGS) ×3 IMPLANT
GAUZE XEROFORM 1X8 LF (GAUZE/BANDAGES/DRESSINGS) ×3 IMPLANT
GLOVE BIO SURGEON STRL SZ7.5 (GLOVE) ×4 IMPLANT
GLOVE BIOGEL PI IND STRL 7.5 (GLOVE) ×1 IMPLANT
GLOVE BIOGEL PI INDICATOR 7.5 (GLOVE) ×1
GOWN STRL REUS W/ TWL LRG LVL3 (GOWN DISPOSABLE) ×1 IMPLANT
GOWN STRL REUS W/ TWL XL LVL3 (GOWN DISPOSABLE) ×1 IMPLANT
GOWN STRL REUS W/TWL LRG LVL3 (GOWN DISPOSABLE) ×2
GOWN STRL REUS W/TWL XL LVL3 (GOWN DISPOSABLE) ×2
K-WIRE DBL TROCAR .045X4 (WIRE)
K-WIRE SURGICAL 1.6X102 (WIRE) ×1 IMPLANT
KIT TURNOVER CYSTO (KITS) ×2 IMPLANT
KWIRE DBL TROCAR .045X4 (WIRE) IMPLANT
LOOP VESSEL MAXI BLUE (MISCELLANEOUS) ×2 IMPLANT
MANIFOLD NEPTUNE II (INSTRUMENTS) IMPLANT
NDL PRECISIONGLIDE 27X1.5 (NEEDLE) ×1 IMPLANT
NDL SAFETY ECLIPSE 18X1.5 (NEEDLE) ×1 IMPLANT
NEEDLE HYPO 18GX1.5 SHARP (NEEDLE) ×2
NEEDLE HYPO 22GX1.5 SAFETY (NEEDLE) ×2 IMPLANT
NEEDLE PRECISIONGLIDE 27X1.5 (NEEDLE) ×2 IMPLANT
NS IRRIG 500ML POUR BTL (IV SOLUTION) ×2 IMPLANT
PACK BASIN DAY SURGERY FS (CUSTOM PROCEDURE TRAY) ×2 IMPLANT
PADDING CAST ABS 4INX4YD NS (CAST SUPPLIES)
PADDING CAST ABS COTTON 4X4 ST (CAST SUPPLIES) IMPLANT
PADDING CAST SYNTHETIC 2 (CAST SUPPLIES)
PADDING CAST SYNTHETIC 2X4 NS (CAST SUPPLIES) IMPLANT
PADDING CAST SYNTHETIC 3 NS LF (CAST SUPPLIES)
PADDING CAST SYNTHETIC 3X4 NS (CAST SUPPLIES) IMPLANT
PENCIL SMOKE EVACUATOR (MISCELLANEOUS) ×2 IMPLANT
PIN SMART 1.5X70 (PIN) ×1 IMPLANT
POSITIONING PIN ×1 IMPLANT
POST 2MM (Post) ×1 IMPLANT
RASP SM TEAR CROSS CUT (RASP) ×1 IMPLANT
SCOTCHCAST PLUS 5X4 WHITE (CAST SUPPLIES) IMPLANT
SMARTPIN ×1 IMPLANT
SPONGE LAP 4X18 RFD (DISPOSABLE) ×2 IMPLANT
STOCKINETTE 4X48 STRL (DRAPES) ×2 IMPLANT
STOCKINETTE 6  STRL (DRAPES) ×4
STOCKINETTE 6 STRL (DRAPES) ×2 IMPLANT
STRIP CLOSURE SKIN 1/2X4 (GAUZE/BANDAGES/DRESSINGS) ×1 IMPLANT
STRIP CLOSURE SKIN 1/4X4 (GAUZE/BANDAGES/DRESSINGS) ×5 IMPLANT
SUCTION FRAZIER HANDLE 10FR (MISCELLANEOUS) ×2
SUCTION TUBE FRAZIER 10FR DISP (MISCELLANEOUS) ×1 IMPLANT
SUT ETHILON 4 0 PS 2 18 (SUTURE) ×4 IMPLANT
SUT ETHILON 5 0 PS 2 18 (SUTURE) ×4 IMPLANT
SUT MNCRL AB 4-0 PS2 18 (SUTURE) ×2 IMPLANT
SUT VIC AB 3-0 FS2 27 (SUTURE) ×2 IMPLANT
SUT VIC AB 3-0 SH 27 (SUTURE) ×2
SUT VIC AB 3-0 SH 27X BRD (SUTURE) ×1 IMPLANT
SUT VIC AB 4-0 PS2 18 (SUTURE) ×4 IMPLANT
SUT VIC AB 4-0 SH 27 (SUTURE) ×2
SUT VIC AB 4-0 SH 27XANBCTRL (SUTURE) ×1 IMPLANT
SYR 3ML 18GX1 1/2 (SYRINGE) IMPLANT
SYR BULB EAR ULCER 3OZ GRN STR (SYRINGE) ×2 IMPLANT
SYR CONTROL 10ML LL (SYRINGE) ×4 IMPLANT
TUBE CONNECTING 12X1/4 (SUCTIONS) ×2 IMPLANT
UNDERPAD 30X36 HEAVY ABSORB (UNDERPADS AND DIAPERS) ×2 IMPLANT
WATER STERILE IRR 500ML POUR (IV SOLUTION) ×2 IMPLANT

## 2020-06-25 NOTE — Discharge Instructions (Signed)
Akin Osteotomy, Care After This sheet gives you information about how to care for yourself after your procedure. Your health care provider may also give you more specific instructions. If you have problems or questions, contact your health care provider. What can I expect after the procedure? After the procedure, it is common to have:  Soreness.  Pain.  Stiffness.  Swelling. Follow these instructions at home: Medicines  Take over-the-counter and prescription medicines only as told by your health care provider.  Ask your health care provider if the medicine prescribed to you can cause constipation. You may need to take steps to prevent or treat constipation, such as: ? Drink enough fluid to keep your urine pale yellow. ? Take over-the-counter or prescription medicines. ? Eat foods that are high in fiber, such as beans, whole grains, and fresh fruits and vegetables. ? Limit foods that are high in fat and processed sugars, such as fried or sweet foods. If you have a splint or walking boot:  Wear it as told by your health care provider. Remove it only as told by your health care provider.  Loosen it if your toes tingle, become numb, or turn cold and blue.  Keep it clean.  If it is not waterproof: ? Do not let it get wet. ? Cover it with a watertight covering when you take a bath or shower. Bathing  Do not take baths, swim, or use a hot tub until your health care provider approves. Ask your health care provider if you may take showers. You may only be allowed to take sponge baths.  Keep the bandage (dressing) dry. Incision care   Follow instructions from your health care provider about how to take care of your incision. Make sure you: ? Wash your hands with soap and water before you change your bandage (dressing). If soap and water are not available, use hand sanitizer. ? Change your dressing as told by your health care provider. ? Leave stitches (sutures), skin glue, or  adhesive strips in place. These skin closures may need to stay in place for 2 weeks or longer. If adhesive strip edges start to loosen and curl up, you may trim the loose edges. Do not remove adhesive strips completely unless your health care provider tells you to do that.  Check your incision area every day for signs of infection. Check for: ? More redness, swelling, or pain. ? Fluid or blood. ? Warmth. ? Pus or a bad smell. Managing pain, stiffness, and swelling   If directed, put ice on the injured area. ? If you have a removable splint, remove it as told by your health care provider. ? Put ice in a plastic bag. ? Place a towel between your skin and the bag. ? Leave the ice on for 20 minutes, 2-3 times a day.  Move your toes often to avoid stiffness and to lessen swelling.  Raise (elevate) the injured area above the level of your heart while you are sitting or lying down. Driving  Do not drive or use heavy machinery while taking prescription pain medicine.  Do not drive for 24 hours if you were given a sedative during your procedure.  Ask your health care provider when it is safe to drive if you have a dressing, splint, special shoe, or walking boot on your foot. General instructions  Do not remove the bandage (dressing) around your foot until directed by your health care provider.  If you were given a splint, special shoe, or  walking boot, wear it as told by your health care provider.  Do not use the injured limb to support your body weight until your health care provider says that you can. Use crutches or a walker as told by your health care provider.  Return to your normal activities as told by your health care provider. Ask your health care provider what activities are safe for you.  Do not use any products that contain nicotine or tobacco, such as cigarettes, e-cigarettes, and chewing tobacco. These can delay bone healing. If you need help quitting, ask your health care  provider.  Keep all follow-up visits as told by your health care provider. This is important. Contact a health care provider if:  You have a fever.  Your dressing becomes wet, loose, or stained with blood or discharge.  You have pus or a bad smell coming from your incision or bandage.  Your foot becomes red, swollen, or tender.  You have pain or stiffness that does not get better or gets worse.  You have tingling or numbness in your foot that does not get better or gets worse. Get help right away if:  You develop a warm and tender swelling in your leg.  You have chest pain.  You have trouble breathing. Summary  After the procedure, it is common to have soreness, pain, stiffness, and swelling.  Follow instructions on caring for your incision, changing your dressing, and using weight support to protect your foot.  Contact your health care provider if you have a fever, pus or a bad smell coming from your wound or dressing, or pain and stiffness do not get better.  Get help right away if you develop a warm and tender swelling in your leg, have chest pain, or trouble breathing. This information is not intended to replace advice given to you by your health care provider. Make sure you discuss any questions you have with your health care provider. Document Revised: 05/28/2018 Document Reviewed: 05/28/2018 Elsevier Patient Education  2020 [place] for Discharge Teaching: EXPAREL (bupivacaine liposome injectable suspension)   Your surgeon or anesthesiologist gave you EXPAREL(bupivacaine) to help control your pain after surgery.   EXPAREL is a local anesthetic that provides pain relief by numbing the tissue around the surgical site.  EXPAREL is designed to release pain medication over time and can control pain for up to 72 hours.  Depending on how you respond to EXPAREL, you may require less pain medication during your recovery.  Possible side  effects:  Temporary loss of sensation or ability to move in the area where bupivacaine was injected.  Nausea, vomiting, constipation  Rarely, numbness and tingling in your mouth or lips, lightheadedness, or anxiety may occur.  Call your doctor right away if you think you may be experiencing any of these sensations, or if you have other questions regarding possible side effects.  Follow all other discharge instructions given to you by your surgeon or nurse. Eat a healthy diet and drink plenty of water or other fluids.  If you return to the hospital for any reason within 96 hours following the administration of EXPAREL, it is important for health care providers to know that you have received this anesthetic. A teal colored band has been placed on your arm with the date, time and amount of EXPAREL you have received in order to alert and inform your health care providers. Please leave this armband in place for the full 96 hours following administration, and then  you may remove the band. Post Anesthesia Home Care Instructions  Activity: Get plenty of rest for the remainder of the day. A responsible individual must stay with you for 24 hours following the procedure.  For the next 24 hours, DO NOT: -Drive a car -Paediatric nurse -Drink alcoholic beverages -Take any medication unless instructed by your physician -Make any legal decisions or sign important papers.  Meals: Start with liquid foods such as gelatin or soup. Progress to regular foods as tolerated. Avoid greasy, spicy, heavy foods. If nausea and/or vomiting occur, drink only clear liquids until the nausea and/or vomiting subsides. Call your physician if vomiting continues.  Special Instructions/Symptoms: Your throat may feel dry or sore from the anesthesia or the breathing tube placed in your throat during surgery. If this causes discomfort, gargle with warm salt water. The discomfort should disappear within 24 hours.  If you had a  scopolamine patch placed behind your ear for the management of post- operative nausea and/or vomiting:  1. The medication in the patch is effective for 72 hours, after which it should be removed.  Wrap patch in a tissue and discard in the trash. Wash hands thoroughly with soap and water. 2. You may remove the patch earlier than 72 hours if you experience unpleasant side effects which may include dry mouth, dizziness or visual disturbances. 3. Avoid touching the patch. Wash your hands with soap and water after contact with the patch.  No ibuprofen, Advil, Aleve, Motrin, or naproxen until after 8:00 pm today if needed.

## 2020-06-25 NOTE — Anesthesia Procedure Notes (Signed)
Procedure Name: Intubation Date/Time: 06/25/2020 11:13 AM Performed by: Justice Rocher, CRNA Pre-anesthesia Checklist: Patient identified, Emergency Drugs available, Suction available, Patient being monitored and Timeout performed Patient Re-evaluated:Patient Re-evaluated prior to induction Oxygen Delivery Method: Circle system utilized Preoxygenation: Pre-oxygenation with 100% oxygen Induction Type: IV induction Ventilation: Mask ventilation without difficulty and Oral airway inserted - appropriate to patient size Laryngoscope Size: Mac, 4 and Glidescope Grade View: Grade II Tube type: Oral Tube size: 8.0 mm Number of attempts: 1 Airway Equipment and Method: Stylet and Oral airway Placement Confirmation: ETT inserted through vocal cords under direct vision,  positive ETCO2,  breath sounds checked- equal and bilateral and CO2 detector Secured at: 24 cm Tube secured with: Tape Dental Injury: Teeth and Oropharynx as per pre-operative assessment

## 2020-06-25 NOTE — Consult Note (Signed)
Details of the procedures were reviewed and all questions answered.  No guarantees given.  He is to be NWB after surgery.

## 2020-06-25 NOTE — Transfer of Care (Signed)
Immediate Anesthesia Transfer of Care Note  Patient: Stephen Dunlap  Procedure(s) Performed: Procedure(s) (LRB): BUNIONECTOMY AND AKIN (Left) HAMMER TOE REPAIR (Left)  Patient Location: PACU  Anesthesia Type: General  Level of Consciousness: awake, sedated, patient cooperative and responds to stimulation  Airway & Oxygen Therapy: Patient Spontanous Breathing and Patient connected to FM 02 and soft FM   Post-op Assessment: Report given to PACU RN, Post -op Vital signs reviewed and stable and Patient moving all extremities  Post vital signs: Reviewed and stable  Complications: No apparent anesthesia complications

## 2020-06-25 NOTE — Anesthesia Postprocedure Evaluation (Signed)
Anesthesia Post Note  Patient: Stephen Dunlap  Procedure(s) Performed: BUNIONECTOMY AND AKIN (Left Toe) HAMMER TOE REPAIR (Left Toe)     Patient location during evaluation: PACU Anesthesia Type: General Level of consciousness: awake Pain management: pain level controlled Vital Signs Assessment: post-procedure vital signs reviewed and stable Respiratory status: spontaneous breathing, nonlabored ventilation, respiratory function stable and patient connected to nasal cannula oxygen Cardiovascular status: blood pressure returned to baseline and stable Postop Assessment: no apparent nausea or vomiting Anesthetic complications: no   No complications documented.  Last Vitals:  Vitals:   06/25/20 1415 06/25/20 1430  BP: (!) 144/68 (!) 142/79  Pulse: 85 85  Resp: 12 15  Temp:  36.7 C  SpO2: 97% 93%    Last Pain:  Vitals:   06/25/20 1430  TempSrc:   PainSc: 0-No pain                 Merlinda Frederick

## 2020-06-25 NOTE — Anesthesia Preprocedure Evaluation (Addendum)
Anesthesia Evaluation  Patient identified by MRN, date of birth, ID band Patient awake    Reviewed: Allergy & Precautions, NPO status , Patient's Chart, lab work & pertinent test results  Airway Mallampati: II  TM Distance: >3 FB Neck ROM: Full    Dental no notable dental hx.    Pulmonary sleep apnea and Continuous Positive Airway Pressure Ventilation ,    Pulmonary exam normal breath sounds clear to auscultation       Cardiovascular hypertension (not on any medications), Normal cardiovascular exam Rhythm:Regular Rate:Normal  4cm aortic aneurysm   Neuro/Psych PSYCHIATRIC DISORDERS Depression TIA   GI/Hepatic GERD  ,  Endo/Other  diabetes, Type 2, Oral Hypoglycemic AgentsMorbid obesity  Renal/GU   negative genitourinary   Musculoskeletal negative musculoskeletal ROS (+)   Abdominal (+) + obese,   Peds  Hematology   Anesthesia Other Findings   Reproductive/Obstetrics negative OB ROS                            Anesthesia Physical Anesthesia Plan  ASA: III  Anesthesia Plan: General   Post-op Pain Management:    Induction: Intravenous  PONV Risk Score and Plan:   Airway Management Planned: Oral ETT  Additional Equipment: None  Intra-op Plan:   Post-operative Plan: Extubation in OR  Informed Consent: I have reviewed the patients History and Physical, chart, labs and discussed the procedure including the risks, benefits and alternatives for the proposed anesthesia with the patient or authorized representative who has indicated his/her understanding and acceptance.     Dental advisory given  Plan Discussed with: CRNA and Anesthesiologist  Anesthesia Plan Comments: (H/o cervical fusion and morbid obesity.Preserved neck extension. Will plan for glidescope intubation. )       Anesthesia Quick Evaluation

## 2020-06-26 ENCOUNTER — Encounter (HOSPITAL_BASED_OUTPATIENT_CLINIC_OR_DEPARTMENT_OTHER): Payer: Self-pay | Admitting: Podiatry

## 2020-06-26 DIAGNOSIS — M21612 Bunion of left foot: Secondary | ICD-10-CM | POA: Diagnosis not present

## 2020-06-26 LAB — SURGICAL PATHOLOGY

## 2020-06-30 NOTE — Op Note (Signed)
Stephen Dunlap, Stephen Dunlap MEDICAL RECORD [id number] ACCOUNT [id number] DATE OF BIRTH:May 06, 1971 FACILITY: [facility] LOCATION: [facility] PHYSICIAN:Cairo Lingenfelter, DPM  OPERATIVE REPORT  DATE OF PROCEDURE:  06/25/2020  SURGEON:  Twanna Hy. Fritzi Mandes, DPM  ASSISTANT:  None.  PREOPERATIVE DIAGNOSES: 1.  Hallux abductovalgus deformity, left foot. 2.  Hallux deformity, left foot. 3.  Second, fourth, and fifth hammertoe deformities, left foot.  POSTOPERATIVE DIAGNOSES: 1.  Hallux abductovalgus deformity, left foot. 2.  Hallux deformity, left foot. 3.  Second, fourth, and fifth hammertoe deformities, left foot.  PROCEDURES: 1.  Left foot bunion correction with double osteotomy and internal fixation. 2.  Hammertoe repairs digits 2, 4, and 5, left foot.  ANESTHESIA:  General.  COMPLICATIONS:  None.  DESCRIPTION OF PROCEDURE:  The patient was brought to the OR and placed in the supine position, at which time general anesthesia was administered.  The patient was prepped and draped in the usual aseptic manner, and the previously applied tourniquet was  inflated to 250 mmHg.  Attention was directed to the first ray where a dorsal linear incision was made.  The incision was deepened via sharp and blunt modalities, taking care to clamp and cauterize all bleeding vessels and ensuring retraction of  neurovascular structures encountered.  The deep and superficial fascia were separated medially and laterally to the length of the incision.  A medial capsulorrhaphy was performed taking care to protect all neurovascular structures.  Once the medial  eminence was identified, this was resected parallel with the shaft with a power saw.  Next, attention was directed to the first interspace where dissection was carried to the first interspace to allow for lateral release of the sesamoids.  Adductor  tendon was freed from the base of the proximal phalanx.  Once a satisfactory reduction was performed, attention was  redirected at the head of the first metatarsal where a transversely oriented V osteotomy was made, taking care not to disrupt the plantar  sesamoids.  The capital fragment was then transposed laterally at the desired amount, impacted on the shaft, and temporarily fixated with a 0.062 K-wire.  Two points of fixation were performed using a 1.5 mm smart pin.  The 0.062 was used as a predrill  hole.  Once this was found to be satisfactory fixation, all K-wires were removed.  The remaining medial eminence was resected parallel with the shaft and rough edges were smoothed.  There was a small amount of cartilage hypergranulation that was sent to  pathology for evaluation.  No erosions seen at the joint.  The area was irrigated with copious amounts of sterile saline and antibiotic solution.  Next, the incision was extended distally along the proximal phalanx.  Taking care to protect the extensor  hallucis longus tendon, an incision was made and the periosteum capsule freed from the proximal phalanx.  Next, an Akin wedge osteotomy was performed taking care to leave the lateral cortex intact.  A wedge of bone was removed.  This was reduced and then  fixated with a 10 mm x 10 mm nitinol Stryker staple.  This was found to be satisfactory reduction.  Irrigation was performed, and deep closure accomplished with 3-0 Vicryl after a medial capsulorrhaphy was performed, and the capsule and periosteum  reapproximated with 3-0 Vicryl.  Deep closure was accomplished with 4-0 Vicryl and skin closure with 4-0 Monocryl.  Next, attention was directed to the second digit where a linear incision was made over the proximal interphalangeal joint.  The incision  was deepened  via sharp and blunt modalities, taking care to clamp and cauterize all bleeding vessels.  The extensor tendon was released and soft tissue structures freed from the head of the proximal phalanx.  Once this was dissected, the head of the  proximal phalanx was  resected.  The area was irrigated with copious amounts of sterile saline, and the tendon reapproximated with 4-0 Vicryl.  Skin closure accomplished with 5-0 nylon.  Next, attention was directed to second MTP joint where a percutaneous stab incision was made.  The incision was deepened to the level of the MTP joint where the dorsal capsule and extensor tendon were released to allow for further reduction of the  deformity.  The area was irrigated and skin closure accomplished with 5-0 nylon.  The exact procedure was performed to the fourth toe without exceptions or exclusions.  Then, attention was directed to the fifth toe where the exact procedure was performed  except the percutaneous incision at the MTP joint and procedure were not performed.  Postoperatively, the patient was injected with Exparel for pain management.  The foot was dressed with Steri-Strips, Xeroform, 4 x 4's, Kling, and Coban.  The  tourniquet was deflated and vascular status returned to all digits.  No complications.  No guarantees given.  All questions answered.  The patient was sent to the recovery room with vital signs stable and capillary refill time at presurgical levels.  IN/NUANCE  D:06/30/2020 T:06/30/2020 JOB:[id number]

## 2020-07-06 ENCOUNTER — Other Ambulatory Visit: Payer: Self-pay | Admitting: Thoracic Surgery (Cardiothoracic Vascular Surgery)

## 2020-07-06 DIAGNOSIS — I712 Thoracic aortic aneurysm, without rupture, unspecified: Secondary | ICD-10-CM

## 2020-07-14 MED FILL — TRULICITY 3 MG/0.5ML SOPN: 3 | 28 days supply | Qty: 2 | Fill #2

## 2020-07-26 DIAGNOSIS — M21612 Bunion of left foot: Secondary | ICD-10-CM | POA: Diagnosis not present

## 2020-08-01 ENCOUNTER — Other Ambulatory Visit: Payer: 59

## 2020-08-07 ENCOUNTER — Ambulatory Visit: Payer: 59 | Admitting: Thoracic Surgery (Cardiothoracic Vascular Surgery)

## 2020-08-08 ENCOUNTER — Other Ambulatory Visit (HOSPITAL_COMMUNITY): Payer: Self-pay | Admitting: Endocrinology

## 2020-08-08 MED FILL — ATORVASTATIN 40 MG TABLET: 40 | 30 days supply | Qty: 30 | Fill #2

## 2020-08-08 MED FILL — CLONAZEPAM 1 MG TABS: 1 | 30 days supply | Qty: 30 | Fill #0

## 2020-08-08 MED FILL — EZETIMIBE 10 MG TABS: 10 | 30 days supply | Qty: 30 | Fill #1

## 2020-08-08 MED FILL — JARDIANCE 25 MG TABLET: 25 | 30 days supply | Qty: 30 | Fill #2

## 2020-08-08 MED FILL — TRULICITY 3 MG/0.5ML SOPN: 3 | 28 days supply | Qty: 2 | Fill #3

## 2020-08-08 MED FILL — ASPIRIN 81 MG TBEC: 81 | 30 days supply | Qty: 30 | Fill #2

## 2020-08-14 ENCOUNTER — Ambulatory Visit: Payer: 59

## 2020-08-16 ENCOUNTER — Ambulatory Visit: Payer: 59 | Attending: Internal Medicine

## 2020-08-16 DIAGNOSIS — Z23 Encounter for immunization: Secondary | ICD-10-CM

## 2020-08-16 NOTE — Progress Notes (Signed)
   Covid-19 Vaccination Clinic  Name:  Stephen Dunlap    MRN: [id number] DOB: 11-05-1970  08/16/2020  Stephen Dunlap was observed post Covid-19 immunization for 30 minutes based on pre-vaccination screening without incident. He was provided with Vaccine Information Sheet and instruction to access the V-Safe system.   Stephen Dunlap was instructed to call 911 with any severe reactions post vaccine: Marland Kitchen Difficulty breathing  . Swelling of face and throat  . A fast heartbeat  . A bad rash all over body  . Dizziness and weakness   Immunizations Administered    Name Date Dose VIS Date Route   Moderna Covid-19 Booster Vaccine 08/16/2020  2:32 PM 0.25 mL 05/16/2020 Intramuscular   Manufacturer: Moderna   Lot: [id number]   [place]: [phone]

## 2020-08-26 DIAGNOSIS — M21612 Bunion of left foot: Secondary | ICD-10-CM | POA: Diagnosis not present

## 2020-09-24 ENCOUNTER — Other Ambulatory Visit (HOSPITAL_COMMUNITY): Payer: Self-pay

## 2020-09-24 DIAGNOSIS — M7061 Trochanteric bursitis, right hip: Secondary | ICD-10-CM | POA: Diagnosis not present

## 2020-09-24 MED FILL — DICLOFENAC SODIUM 75 MG TAB: 75 | 30 days supply | Qty: 60 | Fill #0

## 2020-09-24 MED FILL — AMOXICILLIN 875 MG TABS: 875 | 10 days supply | Qty: 20 | Fill #0

## 2020-10-04 DIAGNOSIS — E1351 Other specified diabetes mellitus with diabetic peripheral angiopathy without gangrene: Secondary | ICD-10-CM | POA: Diagnosis not present

## 2020-10-04 DIAGNOSIS — M65872 Other synovitis and tenosynovitis, left ankle and foot: Secondary | ICD-10-CM | POA: Diagnosis not present

## 2020-10-04 DIAGNOSIS — R609 Edema, unspecified: Secondary | ICD-10-CM | POA: Diagnosis not present

## 2020-10-04 DIAGNOSIS — S92902K Unspecified fracture of left foot, subsequent encounter for fracture with nonunion: Secondary | ICD-10-CM | POA: Diagnosis not present

## 2020-10-04 DIAGNOSIS — M7061 Trochanteric bursitis, right hip: Secondary | ICD-10-CM | POA: Diagnosis not present

## 2020-10-04 DIAGNOSIS — M25551 Pain in right hip: Secondary | ICD-10-CM | POA: Diagnosis not present

## 2020-10-11 DIAGNOSIS — S92902K Unspecified fracture of left foot, subsequent encounter for fracture with nonunion: Secondary | ICD-10-CM | POA: Diagnosis not present

## 2020-10-11 DIAGNOSIS — M960 Pseudarthrosis after fusion or arthrodesis: Secondary | ICD-10-CM | POA: Diagnosis not present

## 2020-10-11 DIAGNOSIS — M65872 Other synovitis and tenosynovitis, left ankle and foot: Secondary | ICD-10-CM | POA: Diagnosis not present

## 2020-10-11 DIAGNOSIS — E1351 Other specified diabetes mellitus with diabetic peripheral angiopathy without gangrene: Secondary | ICD-10-CM | POA: Diagnosis not present

## 2020-10-15 DIAGNOSIS — M25551 Pain in right hip: Secondary | ICD-10-CM | POA: Diagnosis not present

## 2020-10-15 DIAGNOSIS — M7061 Trochanteric bursitis, right hip: Secondary | ICD-10-CM | POA: Diagnosis not present

## 2020-10-24 DIAGNOSIS — M25551 Pain in right hip: Secondary | ICD-10-CM | POA: Diagnosis not present

## 2020-10-24 DIAGNOSIS — M7061 Trochanteric bursitis, right hip: Secondary | ICD-10-CM | POA: Diagnosis not present

## 2020-10-25 ENCOUNTER — Encounter: Payer: Self-pay | Admitting: Gastroenterology

## 2020-10-29 DIAGNOSIS — M7061 Trochanteric bursitis, right hip: Secondary | ICD-10-CM | POA: Diagnosis not present

## 2020-10-29 DIAGNOSIS — M25551 Pain in right hip: Secondary | ICD-10-CM | POA: Diagnosis not present

## 2020-10-30 ENCOUNTER — Other Ambulatory Visit (HOSPITAL_COMMUNITY): Payer: Self-pay | Admitting: Endocrinology

## 2020-10-30 ENCOUNTER — Other Ambulatory Visit (HOSPITAL_COMMUNITY): Payer: Self-pay

## 2020-10-30 MED FILL — Clonazepam Tab 1 MG: ORAL | 90 days supply | Qty: 90 | Fill #0 | Status: AC

## 2020-10-31 ENCOUNTER — Other Ambulatory Visit (HOSPITAL_COMMUNITY): Payer: Self-pay

## 2020-10-31 MED ORDER — EMPAGLIFLOZIN 25 MG PO TABS
ORAL_TABLET | ORAL | 1 refills | Status: AC
  Filled 2020-10-31: qty 30, 30d supply, fill #0
  Filled 2021-03-06: qty 30, 30d supply, fill #1
  Filled 2021-07-01: qty 30, 30d supply, fill #2

## 2020-11-02 DIAGNOSIS — M7061 Trochanteric bursitis, right hip: Secondary | ICD-10-CM | POA: Diagnosis not present

## 2020-11-02 DIAGNOSIS — M25551 Pain in right hip: Secondary | ICD-10-CM | POA: Diagnosis not present

## 2020-11-07 DIAGNOSIS — M25551 Pain in right hip: Secondary | ICD-10-CM | POA: Diagnosis not present

## 2020-11-07 DIAGNOSIS — M7061 Trochanteric bursitis, right hip: Secondary | ICD-10-CM | POA: Diagnosis not present

## 2020-11-08 DIAGNOSIS — E1351 Other specified diabetes mellitus with diabetic peripheral angiopathy without gangrene: Secondary | ICD-10-CM | POA: Diagnosis not present

## 2020-11-08 DIAGNOSIS — M65872 Other synovitis and tenosynovitis, left ankle and foot: Secondary | ICD-10-CM | POA: Diagnosis not present

## 2020-11-08 DIAGNOSIS — M960 Pseudarthrosis after fusion or arthrodesis: Secondary | ICD-10-CM | POA: Diagnosis not present

## 2020-11-08 DIAGNOSIS — S92902K Unspecified fracture of left foot, subsequent encounter for fracture with nonunion: Secondary | ICD-10-CM | POA: Diagnosis not present

## 2020-11-23 ENCOUNTER — Other Ambulatory Visit (HOSPITAL_COMMUNITY): Payer: Self-pay

## 2020-11-23 MED FILL — Dulaglutide Soln Auto-injector 3 MG/0.5ML: SUBCUTANEOUS | 28 days supply | Qty: 2 | Fill #0 | Status: AC

## 2020-11-27 DIAGNOSIS — M7061 Trochanteric bursitis, right hip: Secondary | ICD-10-CM | POA: Diagnosis not present

## 2020-11-27 DIAGNOSIS — M25551 Pain in right hip: Secondary | ICD-10-CM | POA: Diagnosis not present

## 2020-12-10 DIAGNOSIS — S92902K Unspecified fracture of left foot, subsequent encounter for fracture with nonunion: Secondary | ICD-10-CM | POA: Diagnosis not present

## 2020-12-10 DIAGNOSIS — E1351 Other specified diabetes mellitus with diabetic peripheral angiopathy without gangrene: Secondary | ICD-10-CM | POA: Diagnosis not present

## 2020-12-10 DIAGNOSIS — M65872 Other synovitis and tenosynovitis, left ankle and foot: Secondary | ICD-10-CM | POA: Diagnosis not present

## 2020-12-10 DIAGNOSIS — M960 Pseudarthrosis after fusion or arthrodesis: Secondary | ICD-10-CM | POA: Diagnosis not present

## 2020-12-11 DIAGNOSIS — M7061 Trochanteric bursitis, right hip: Secondary | ICD-10-CM | POA: Diagnosis not present

## 2020-12-11 DIAGNOSIS — M25551 Pain in right hip: Secondary | ICD-10-CM | POA: Diagnosis not present

## 2021-01-25 ENCOUNTER — Other Ambulatory Visit (HOSPITAL_COMMUNITY): Payer: Self-pay

## 2021-01-25 DIAGNOSIS — M9689 Other intraoperative and postprocedural complications and disorders of the musculoskeletal system: Secondary | ICD-10-CM | POA: Diagnosis not present

## 2021-01-25 MED FILL — Dulaglutide Soln Auto-injector 3 MG/0.5ML: SUBCUTANEOUS | 28 days supply | Qty: 2 | Fill #1 | Status: AC

## 2021-01-29 ENCOUNTER — Other Ambulatory Visit (HOSPITAL_COMMUNITY): Payer: Self-pay

## 2021-02-01 ENCOUNTER — Other Ambulatory Visit (HOSPITAL_COMMUNITY): Payer: Self-pay

## 2021-02-04 ENCOUNTER — Other Ambulatory Visit (HOSPITAL_COMMUNITY): Payer: Self-pay

## 2021-02-12 ENCOUNTER — Other Ambulatory Visit (HOSPITAL_COMMUNITY): Payer: Self-pay

## 2021-02-12 MED ORDER — KETOCONAZOLE 2 % EX CREA
TOPICAL_CREAM | CUTANEOUS | 0 refills | Status: AC
  Filled 2021-02-12: qty 30, 14d supply, fill #0

## 2021-02-12 MED ORDER — FLUCONAZOLE 150 MG PO TABS
ORAL_TABLET | ORAL | 0 refills | Status: DC
Start: 2021-02-12 — End: 2022-05-28
  Filled 2021-02-12: qty 3, 3d supply, fill #0

## 2021-02-15 ENCOUNTER — Other Ambulatory Visit (HOSPITAL_COMMUNITY): Payer: Self-pay

## 2021-03-04 DIAGNOSIS — S92902K Unspecified fracture of left foot, subsequent encounter for fracture with nonunion: Secondary | ICD-10-CM | POA: Diagnosis not present

## 2021-03-04 DIAGNOSIS — E139 Other specified diabetes mellitus without complications: Secondary | ICD-10-CM | POA: Diagnosis not present

## 2021-03-04 DIAGNOSIS — M9689 Other intraoperative and postprocedural complications and disorders of the musculoskeletal system: Secondary | ICD-10-CM | POA: Diagnosis not present

## 2021-03-04 DIAGNOSIS — M205X2 Other deformities of toe(s) (acquired), left foot: Secondary | ICD-10-CM | POA: Diagnosis not present

## 2021-03-06 ENCOUNTER — Other Ambulatory Visit (HOSPITAL_COMMUNITY): Payer: Self-pay

## 2021-03-06 MED FILL — Dulaglutide Soln Auto-injector 3 MG/0.5ML: SUBCUTANEOUS | 28 days supply | Qty: 2 | Fill #2 | Status: AC

## 2021-03-08 ENCOUNTER — Other Ambulatory Visit (HOSPITAL_COMMUNITY): Payer: Self-pay

## 2021-04-03 DIAGNOSIS — M9689 Other intraoperative and postprocedural complications and disorders of the musculoskeletal system: Secondary | ICD-10-CM | POA: Diagnosis not present

## 2021-04-03 DIAGNOSIS — M205X2 Other deformities of toe(s) (acquired), left foot: Secondary | ICD-10-CM | POA: Diagnosis not present

## 2021-04-03 DIAGNOSIS — E139 Other specified diabetes mellitus without complications: Secondary | ICD-10-CM | POA: Diagnosis not present

## 2021-04-03 DIAGNOSIS — S92902K Unspecified fracture of left foot, subsequent encounter for fracture with nonunion: Secondary | ICD-10-CM | POA: Diagnosis not present

## 2021-04-04 DIAGNOSIS — R82998 Other abnormal findings in urine: Secondary | ICD-10-CM | POA: Diagnosis not present

## 2021-04-05 ENCOUNTER — Other Ambulatory Visit (HOSPITAL_COMMUNITY): Payer: Self-pay

## 2021-04-05 DIAGNOSIS — G4733 Obstructive sleep apnea (adult) (pediatric): Secondary | ICD-10-CM | POA: Diagnosis not present

## 2021-04-05 DIAGNOSIS — E785 Hyperlipidemia, unspecified: Secondary | ICD-10-CM | POA: Diagnosis not present

## 2021-04-05 DIAGNOSIS — I712 Thoracic aortic aneurysm, without rupture: Secondary | ICD-10-CM | POA: Diagnosis not present

## 2021-04-05 DIAGNOSIS — I5189 Other ill-defined heart diseases: Secondary | ICD-10-CM | POA: Diagnosis not present

## 2021-04-05 DIAGNOSIS — E1142 Type 2 diabetes mellitus with diabetic polyneuropathy: Secondary | ICD-10-CM | POA: Diagnosis not present

## 2021-04-05 DIAGNOSIS — Z0000 Encounter for general adult medical examination without abnormal findings: Secondary | ICD-10-CM | POA: Diagnosis not present

## 2021-04-05 DIAGNOSIS — I10 Essential (primary) hypertension: Secondary | ICD-10-CM | POA: Diagnosis not present

## 2021-04-05 DIAGNOSIS — K760 Fatty (change of) liver, not elsewhere classified: Secondary | ICD-10-CM | POA: Diagnosis not present

## 2021-04-05 MED ORDER — MOUNJARO 5 MG/0.5ML ~~LOC~~ SOAJ
SUBCUTANEOUS | 3 refills | Status: DC
Start: 2021-04-05 — End: 2022-06-05
  Filled 2021-04-05: qty 2, 28d supply, fill #0
  Filled 2021-05-29: qty 2, 28d supply, fill #1
  Filled 2021-06-25: qty 2, 28d supply, fill #2
  Filled 2021-07-18: qty 2, 28d supply, fill #3

## 2021-04-05 MED ORDER — ROSUVASTATIN CALCIUM 5 MG PO TABS
ORAL_TABLET | ORAL | 0 refills | Status: DC
Start: 2021-04-05 — End: 2022-05-27
  Filled 2021-04-05: qty 8, 28d supply, fill #0

## 2021-04-05 MED ORDER — LOSARTAN POTASSIUM 50 MG PO TABS
50.0000 mg | ORAL_TABLET | Freq: Every day | ORAL | 3 refills | Status: DC
Start: 2021-04-05 — End: 2023-12-08
  Filled 2021-04-05: qty 30, 30d supply, fill #0
  Filled 2021-05-29 – 2021-07-01 (×2): qty 30, 30d supply, fill #1
  Filled 2021-11-27: qty 30, 30d supply, fill #2
  Filled 2021-12-24: qty 30, 30d supply, fill #3
  Filled 2022-03-04: qty 30, 30d supply, fill #4

## 2021-04-12 ENCOUNTER — Other Ambulatory Visit (HOSPITAL_COMMUNITY): Payer: Self-pay

## 2021-04-12 MED ORDER — CARESTART COVID-19 HOME TEST VI KIT
PACK | 0 refills | Status: DC
  Filled 2021-04-12 (×2): qty 4, 4d supply, fill #0

## 2021-04-15 ENCOUNTER — Other Ambulatory Visit (HOSPITAL_COMMUNITY): Payer: Self-pay

## 2021-04-15 MED ORDER — LAGEVRIO 200 MG PO CAPS
ORAL_CAPSULE | ORAL | 0 refills | Status: DC
Start: 2021-04-15 — End: 2023-05-16
  Filled 2021-04-15: qty 40, 5d supply, fill #0

## 2021-05-02 ENCOUNTER — Other Ambulatory Visit: Payer: Self-pay | Admitting: *Deleted

## 2021-05-02 DIAGNOSIS — I712 Thoracic aortic aneurysm, without rupture, unspecified: Secondary | ICD-10-CM

## 2021-05-02 DIAGNOSIS — E139 Other specified diabetes mellitus without complications: Secondary | ICD-10-CM | POA: Diagnosis not present

## 2021-05-02 DIAGNOSIS — M205X2 Other deformities of toe(s) (acquired), left foot: Secondary | ICD-10-CM | POA: Diagnosis not present

## 2021-05-02 DIAGNOSIS — S92902K Unspecified fracture of left foot, subsequent encounter for fracture with nonunion: Secondary | ICD-10-CM | POA: Diagnosis not present

## 2021-05-20 ENCOUNTER — Other Ambulatory Visit (HOSPITAL_COMMUNITY): Payer: Self-pay

## 2021-05-29 ENCOUNTER — Other Ambulatory Visit (HOSPITAL_COMMUNITY): Payer: Self-pay

## 2021-05-31 ENCOUNTER — Ambulatory Visit
Admission: RE | Admit: 2021-05-31 | Discharge: 2021-05-31 | Disposition: A | Discharge status: Home / Self Care | Payer: 59 | Source: Ambulatory Visit | Attending: Thoracic Surgery (Cardiothoracic Vascular Surgery) | Admitting: Thoracic Surgery (Cardiothoracic Vascular Surgery)

## 2021-05-31 DIAGNOSIS — I712 Thoracic aortic aneurysm, without rupture, unspecified: Secondary | ICD-10-CM

## 2021-05-31 MED ORDER — IOPAMIDOL (ISOVUE-370) INJECTION 76%
75.0000 mL | Freq: Once | INTRAVENOUS | Status: AC | PRN
  Administered 2021-05-31: 75 mL via INTRAVENOUS

## 2021-06-04 ENCOUNTER — Other Ambulatory Visit: Payer: Self-pay

## 2021-06-04 ENCOUNTER — Ambulatory Visit (INDEPENDENT_AMBULATORY_CARE_PROVIDER_SITE_OTHER): Payer: 59 | Admitting: Physician Assistant

## 2021-06-04 VITALS — BP 130/88 | HR 82 | Resp 20 | Ht 77.0 in | Wt >= 6400 oz

## 2021-06-04 DIAGNOSIS — I10 Essential (primary) hypertension: Secondary | ICD-10-CM

## 2021-06-04 DIAGNOSIS — I7121 Aneurysm of the ascending aorta, without rupture: Secondary | ICD-10-CM

## 2021-06-04 NOTE — Patient Instructions (Signed)
Avoid prolonged strenuous activities.  Continue to manage and monitor your blood pressure carefully and keep your blood pressure below 140/90.  Avoid fluoroquinolone antibiotics such as ciprofloxacin, levofloxacin, moxifloxacin, and ofloxacin.

## 2021-06-04 NOTE — Progress Notes (Signed)
[address]       ,[place] [address]             [phone]       HPI: Stephen Dunlap is a  50 year old gentleman with past history of hypertension, type 2 diabetes mellitus, asthma, sleep apnea, morbid obesity and COVID-19 infection.  He is followed by our practice for surveillance of a borderline ascending aortic aneurysm.  This was discovered in November 2020 when he was diagnosed with COVID-19.  During the course of work-up, the chest x-ray demonstrated prominence of the ascending aorta.  Follow-up CT scan was obtained that was reviewed 3.9 cm ectatic dilation of the ascending aorta He was seen by Dr. Roxan Hockey 1 year later and at that time had lost about 30 pounds from his previous weight of 400 pounds.  Follow-up CT scan at that time showed the ascending aorta to measure 4.0 cm . He returns today, 1 year later, for follow-up surveillance.  He reports no changes in his health.  He denies any chest pain.      Current Outpatient Medications  Medication Sig Dispense Refill   albuterol (PROVENTIL HFA;VENTOLIN HFA) 108 (90 BASE) MCG/ACT inhaler Inhale 2 puffs into the lungs every 6 (six) hours as needed for wheezing or shortness of breath. 1 Inhaler 2   amoxicillin (AMOXIL) 875 MG tablet TAKE 1 TABLET BY MOUTH EVERY 12 HOURS 20 tablet 0   aspirin EC 81 MG tablet Take 81 mg by mouth daily.     atorvastatin (LIPITOR) 20 MG tablet Take 20 mg by mouth daily.     atorvastatin (LIPITOR) 40 MG tablet TAKE 1 TABLET BY MOUTH ONCE DAILY 90 tablet 1   clonazePAM (KLONOPIN) 1 MG tablet Take 1 mg by mouth at bedtime.      clonazePAM (KLONOPIN) 1 MG tablet TAKE 1 TABLET BY MOUTH AT BEDTIME 90 tablet 1   COVID-19 At Home Antigen Test (CARESTART COVID-19 HOME TEST) KIT Use as directed 4 each 0   diclofenac (VOLTAREN) 75 MG EC tablet TAKE 1 TABLET BY MOUTH 2 TIMES DAILY 60 tablet 1   empagliflozin (JARDIANCE) 25 MG TABS tablet Take 25 mg by mouth daily.     empagliflozin  (JARDIANCE) 25 MG TABS tablet TAKE 1 TABLET BY MOUTH ONCE DAILY-SCHEDULE 2021 EXAM TO AVOID REFILL INTERRUPTIONS. 90 tablet 1   EPINEPHrine (AUVI-Q) 0.3 mg/0.3 mL IJ SOAJ injection Inject 0.3 mg into the muscle once.     ezetimibe (ZETIA) 10 MG tablet TAKE 1 TABLET BY MOUTH ONCE DAILY 90 tablet 3   fluconazole (DIFLUCAN) 150 MG tablet Take 1 tablet by mouth daily for 3 days as directed 3 tablet 0   fluticasone (FLONASE) 50 MCG/ACT nasal spray Place 1 spray into both nostrils daily as needed for allergies or rhinitis.     HYDROcodone-acetaminophen (NORCO/VICODIN) 5-325 MG tablet Take 1-2 tablets by mouth every 4 (four) hours as needed for moderate pain ((score 4 to 6)). 50 tablet 0   ibuprofen (ADVIL,MOTRIN) 800 MG tablet Take 800 mg by mouth every 8 (eight) hours as needed for mild pain or moderate pain.      JANUMET 50-1000 MG tablet Take 1 tablet by mouth 2 (two) times daily with a meal.   7   ketoconazole (NIZORAL) 2 % cream Apply to the affected area daily as directed 30 g 0   losartan (COZAAR) 50 MG tablet Take 1 tablet (50 mg total) by mouth daily. 90 tablet  3   molnupiravir EUA (LAGEVRIO) 200 MG CAPS capsule Take 4 capsules by mouth every 12 hrs for 5 days 40 capsule 0   montelukast (SINGULAIR) 10 MG tablet TAKE 1 TABLET BY MOUTH EACH NIGHT AT BEDTIME 30 tablet 0   olopatadine (PATANOL) 0.1 % ophthalmic solution INSTILL 1 DROP INTO BOTH EYES DAILY 5 mL 0   omeprazole (PRILOSEC) 20 MG capsule Take 1 capsule (20 mg total) by mouth daily. (Patient taking differently: Take 20 mg by mouth daily as needed (acid reflux). ) 90 capsule 3   pregabalin (LYRICA) 75 MG capsule TAKE 1 CAPSULE BY MOUTH 2 TIMES DAILY 60 capsule 2   rosuvastatin (CRESTOR) 5 MG tablet Take 1 tablet by mouth twice a week 30 tablet 0   tirzepatide (MOUNJARO) 5 MG/0.5ML Pen Inject 15m (0.556m subcutaneous once a week 6 mL 3   TRULICITY 1.5 MGDX/4.1OIOPN Inject 1.5 mg as directed every Monday.  5   No current  facility-administered medications for this visit.    Physical Exam  Vital signs: BP 130/88 HR 82 RR 20 SaO2 98% on room air Weight today is 400lbs      Diagnostic Tests:  CLINICAL DATA:  Nontraumatic aortic disease.   EXAM: CT ANGIOGRAPHY CHEST WITH CONTRAST   TECHNIQUE: Multidetector CT imaging of the chest was performed using the standard protocol during bolus administration of intravenous contrast. Multiplanar CT image reconstructions and MIPs were obtained to evaluate the vascular anatomy.   CONTRAST:  758mSOVUE-370 IOPAMIDOL (ISOVUE-370) INJECTION 76%   COMPARISON:  May 18, 2020.   FINDINGS: Cardiovascular: Grossly stable 4.1 cm ascending thoracic aortic aneurysm. No dissection is noted. Normal cardiac size. No pericardial effusion.   Mediastinum/Nodes: No enlarged mediastinal, hilar, or axillary lymph nodes. Thyroid gland, trachea, and esophagus demonstrate no significant findings.   Lungs/Pleura: Lungs are clear. No pleural effusion or pneumothorax.   Upper Abdomen: No acute abnormality.   Musculoskeletal: No chest wall abnormality. No acute or significant osseous findings.   Review of the MIP images confirms the above findings.   IMPRESSION: Grossly stable 4.1 cm ascending thoracic aortic aneurysm. Recommend annual imaging followup by CTA or MRA. This recommendation follows 2010 ACCF/AHA/AATS/ACR/ASA/SCA/SCAI/SIR/STS/SVM Guidelines for the Diagnosis and Management of Patients with Thoracic Aortic Disease. Circulation. 2010; 121: E: [id number] aneurysm NOS (ICD10-I71.9).     Electronically Signed   By: JamMarijo ConceptionD.   On: 05/31/2021 10:23  Impression / Plan:  Stephen Dunlap a  50 24ar old gentleman with past history of hypertension, type 2 diabetes mellitus, asthma, sleep apnea, and morbid obesity.  He was incidentally found to have borderline ascending aortic aneurysm during COVID-19 work-up in November 2020.  He remains  asymptomatic.  Follow-up CTA of his chest does not demonstrates any increase in the ascending aortic diameter since 1 year ago.  His blood pressure appears well controlled on Cozaar 50 mg p.o. daily.  An echocardiogram obtained in 2018 showed the normal trileaflet aortic valve with normal function. The importance of careful surveillance and and contorl of blood pressure were again discussed with Mr. BurSabadoHe is also cautioned regarding importance of avoiding prolonged strenuous activities that would increase intrathoracic pressure.  He is advised to avoid fluoroquinolones due to their tendency to weaken connective tissues.  Recommend follow-up with CTA of the chest  in 1 year    MyrAntony OdeaA-C [facility] [phone]

## 2021-06-13 DIAGNOSIS — M21961 Unspecified acquired deformity of right lower leg: Secondary | ICD-10-CM | POA: Diagnosis not present

## 2021-06-13 DIAGNOSIS — M205X1 Other deformities of toe(s) (acquired), right foot: Secondary | ICD-10-CM | POA: Diagnosis not present

## 2021-06-13 DIAGNOSIS — E139 Other specified diabetes mellitus without complications: Secondary | ICD-10-CM | POA: Diagnosis not present

## 2021-06-13 DIAGNOSIS — S92902K Unspecified fracture of left foot, subsequent encounter for fracture with nonunion: Secondary | ICD-10-CM | POA: Diagnosis not present

## 2021-06-13 DIAGNOSIS — M205X2 Other deformities of toe(s) (acquired), left foot: Secondary | ICD-10-CM | POA: Diagnosis not present

## 2021-06-13 DIAGNOSIS — M21962 Unspecified acquired deformity of left lower leg: Secondary | ICD-10-CM | POA: Diagnosis not present

## 2021-06-25 ENCOUNTER — Other Ambulatory Visit (HOSPITAL_COMMUNITY): Payer: Self-pay

## 2021-07-01 ENCOUNTER — Other Ambulatory Visit (HOSPITAL_COMMUNITY): Payer: Self-pay

## 2021-07-04 ENCOUNTER — Other Ambulatory Visit (HOSPITAL_COMMUNITY): Payer: Self-pay

## 2021-07-05 ENCOUNTER — Other Ambulatory Visit (HOSPITAL_COMMUNITY): Payer: Self-pay

## 2021-07-08 ENCOUNTER — Other Ambulatory Visit (HOSPITAL_COMMUNITY): Payer: Self-pay

## 2021-07-08 MED ORDER — PREGABALIN 75 MG PO CAPS
75.0000 mg | ORAL_CAPSULE | Freq: Two times a day (BID) | ORAL | 2 refills | Status: DC
  Filled 2021-07-08: qty 60, 30d supply, fill #0
  Filled 2021-11-27: qty 60, 30d supply, fill #1
  Filled 2021-12-24: qty 60, 30d supply, fill #2

## 2021-07-15 DIAGNOSIS — M205X2 Other deformities of toe(s) (acquired), left foot: Secondary | ICD-10-CM | POA: Diagnosis not present

## 2021-07-15 DIAGNOSIS — M21962 Unspecified acquired deformity of left lower leg: Secondary | ICD-10-CM | POA: Diagnosis not present

## 2021-07-15 DIAGNOSIS — E139 Other specified diabetes mellitus without complications: Secondary | ICD-10-CM | POA: Diagnosis not present

## 2021-07-15 DIAGNOSIS — S92902K Unspecified fracture of left foot, subsequent encounter for fracture with nonunion: Secondary | ICD-10-CM | POA: Diagnosis not present

## 2021-07-18 ENCOUNTER — Other Ambulatory Visit (HOSPITAL_COMMUNITY): Payer: Self-pay

## 2021-08-06 ENCOUNTER — Other Ambulatory Visit (HOSPITAL_COMMUNITY): Payer: Self-pay

## 2021-08-06 DIAGNOSIS — I5189 Other ill-defined heart diseases: Secondary | ICD-10-CM | POA: Diagnosis not present

## 2021-08-06 DIAGNOSIS — E1149 Type 2 diabetes mellitus with other diabetic neurological complication: Secondary | ICD-10-CM | POA: Diagnosis not present

## 2021-08-06 DIAGNOSIS — Z1339 Encounter for screening examination for other mental health and behavioral disorders: Secondary | ICD-10-CM | POA: Diagnosis not present

## 2021-08-06 DIAGNOSIS — G4733 Obstructive sleep apnea (adult) (pediatric): Secondary | ICD-10-CM | POA: Diagnosis not present

## 2021-08-06 DIAGNOSIS — I10 Essential (primary) hypertension: Secondary | ICD-10-CM | POA: Diagnosis not present

## 2021-08-06 DIAGNOSIS — I6523 Occlusion and stenosis of bilateral carotid arteries: Secondary | ICD-10-CM | POA: Diagnosis not present

## 2021-08-06 DIAGNOSIS — Z1331 Encounter for screening for depression: Secondary | ICD-10-CM | POA: Diagnosis not present

## 2021-08-06 DIAGNOSIS — G459 Transient cerebral ischemic attack, unspecified: Secondary | ICD-10-CM | POA: Diagnosis not present

## 2021-08-06 DIAGNOSIS — F331 Major depressive disorder, recurrent, moderate: Secondary | ICD-10-CM | POA: Diagnosis not present

## 2021-08-06 DIAGNOSIS — E785 Hyperlipidemia, unspecified: Secondary | ICD-10-CM | POA: Diagnosis not present

## 2021-08-06 MED ORDER — MOUNJARO 7.5 MG/0.5ML ~~LOC~~ SOAJ
SUBCUTANEOUS | 0 refills | Status: DC
  Filled 2021-08-06: qty 2, 28d supply, fill #0
  Filled 2021-08-27: qty 2, 28d supply, fill #1
  Filled 2021-09-30: qty 2, 28d supply, fill #2

## 2021-08-27 ENCOUNTER — Other Ambulatory Visit (HOSPITAL_COMMUNITY): Payer: Self-pay

## 2021-09-30 ENCOUNTER — Other Ambulatory Visit (HOSPITAL_COMMUNITY): Payer: Self-pay

## 2021-10-01 ENCOUNTER — Other Ambulatory Visit (HOSPITAL_COMMUNITY): Payer: Self-pay

## 2021-10-01 MED ORDER — MOUNJARO 10 MG/0.5ML ~~LOC~~ SOAJ
SUBCUTANEOUS | 0 refills | Status: DC
  Filled 2021-10-01: qty 2, 28d supply, fill #0

## 2021-10-11 ENCOUNTER — Other Ambulatory Visit (HOSPITAL_COMMUNITY): Payer: Self-pay

## 2021-10-23 DIAGNOSIS — E139 Other specified diabetes mellitus without complications: Secondary | ICD-10-CM | POA: Diagnosis not present

## 2021-10-23 DIAGNOSIS — M205X2 Other deformities of toe(s) (acquired), left foot: Secondary | ICD-10-CM | POA: Diagnosis not present

## 2021-10-23 DIAGNOSIS — S92902K Unspecified fracture of left foot, subsequent encounter for fracture with nonunion: Secondary | ICD-10-CM | POA: Diagnosis not present

## 2021-10-23 DIAGNOSIS — L02612 Cutaneous abscess of left foot: Secondary | ICD-10-CM | POA: Diagnosis not present

## 2021-10-23 DIAGNOSIS — L03032 Cellulitis of left toe: Secondary | ICD-10-CM | POA: Diagnosis not present

## 2021-10-23 DIAGNOSIS — L600 Ingrowing nail: Secondary | ICD-10-CM | POA: Diagnosis not present

## 2021-10-25 ENCOUNTER — Other Ambulatory Visit (HOSPITAL_COMMUNITY): Payer: Self-pay

## 2021-10-25 MED ORDER — MOUNJARO 10 MG/0.5ML ~~LOC~~ SOAJ
SUBCUTANEOUS | 0 refills | Status: DC
  Filled 2021-10-25: qty 2, 30d supply, fill #0

## 2021-10-29 ENCOUNTER — Other Ambulatory Visit (HOSPITAL_COMMUNITY): Payer: Self-pay

## 2021-10-29 MED ORDER — MOUNJARO 12.5 MG/0.5ML ~~LOC~~ SOAJ
SUBCUTANEOUS | 0 refills | Status: DC
  Filled 2021-10-29: qty 2, 28d supply, fill #0

## 2021-11-27 ENCOUNTER — Other Ambulatory Visit (HOSPITAL_COMMUNITY): Payer: Self-pay

## 2021-11-27 MED ORDER — MOUNJARO 12.5 MG/0.5ML ~~LOC~~ SOAJ
SUBCUTANEOUS | 5 refills | Status: DC
  Filled 2021-11-27: qty 2, 28d supply, fill #0
  Filled 2021-12-24: qty 2, 28d supply, fill #1
  Filled 2022-02-15: qty 2, 28d supply, fill #2
  Filled 2022-03-18: qty 2, 28d supply, fill #3

## 2021-11-28 ENCOUNTER — Other Ambulatory Visit (HOSPITAL_COMMUNITY): Payer: Self-pay

## 2021-11-28 MED ORDER — CLONAZEPAM 1 MG PO TABS
1.0000 mg | ORAL_TABLET | Freq: Every day | ORAL | 1 refills | Status: DC
  Filled 2021-11-28: qty 90, 90d supply, fill #0
  Filled 2022-03-04: qty 90, 90d supply, fill #1

## 2021-12-24 ENCOUNTER — Other Ambulatory Visit (HOSPITAL_COMMUNITY): Payer: Self-pay

## 2021-12-24 MED ORDER — ASPIRIN 81 MG PO TBEC
DELAYED_RELEASE_TABLET | ORAL | 4 refills | Status: DC
  Filled 2021-12-24: qty 31, 31d supply, fill #0

## 2022-02-15 ENCOUNTER — Other Ambulatory Visit (HOSPITAL_COMMUNITY): Payer: Self-pay

## 2022-03-04 ENCOUNTER — Other Ambulatory Visit (HOSPITAL_COMMUNITY): Payer: Self-pay

## 2022-03-14 ENCOUNTER — Other Ambulatory Visit (HOSPITAL_COMMUNITY): Payer: Self-pay

## 2022-03-14 MED ORDER — JARDIANCE 25 MG PO TABS
ORAL_TABLET | ORAL | 0 refills | Status: DC
  Filled 2022-03-14: qty 30, 30d supply, fill #0
  Filled 2022-05-09: qty 30, 30d supply, fill #1
  Filled 2022-10-06: qty 30, 30d supply, fill #2

## 2022-03-18 ENCOUNTER — Other Ambulatory Visit (HOSPITAL_COMMUNITY): Payer: Self-pay

## 2022-04-07 DIAGNOSIS — M25551 Pain in right hip: Secondary | ICD-10-CM | POA: Diagnosis not present

## 2022-04-07 DIAGNOSIS — M7061 Trochanteric bursitis, right hip: Secondary | ICD-10-CM | POA: Diagnosis not present

## 2022-04-09 ENCOUNTER — Other Ambulatory Visit (HOSPITAL_COMMUNITY): Payer: Self-pay

## 2022-04-09 MED ORDER — LOSARTAN POTASSIUM 100 MG PO TABS
100.0000 mg | ORAL_TABLET | Freq: Every day | ORAL | 3 refills | Status: DC
  Filled 2022-04-09: qty 90, 90d supply, fill #0

## 2022-04-09 MED ORDER — EZETIMIBE 10 MG PO TABS
10.0000 mg | ORAL_TABLET | Freq: Every day | ORAL | 3 refills | Status: DC
  Filled 2022-04-09: qty 90, 90d supply, fill #0

## 2022-04-09 MED ORDER — MOUNJARO 15 MG/0.5ML ~~LOC~~ SOAJ
15.0000 mg | SUBCUTANEOUS | 5 refills | Status: DC
  Filled 2022-04-09: qty 2, 28d supply, fill #0
  Filled 2022-05-09: qty 2, 28d supply, fill #1
  Filled 2022-07-07: qty 2, 28d supply, fill #2
  Filled 2022-08-28: qty 2, 28d supply, fill #3
  Filled 2022-09-30: qty 2, 28d supply, fill #4
  Filled 2022-10-24: qty 2, 28d supply, fill #5

## 2022-04-11 ENCOUNTER — Other Ambulatory Visit (HOSPITAL_COMMUNITY): Payer: Self-pay

## 2022-04-28 ENCOUNTER — Other Ambulatory Visit: Payer: Self-pay | Admitting: Thoracic Surgery (Cardiothoracic Vascular Surgery)

## 2022-04-28 DIAGNOSIS — I7121 Aneurysm of the ascending aorta, without rupture: Secondary | ICD-10-CM

## 2022-05-01 ENCOUNTER — Emergency Department (HOSPITAL_BASED_OUTPATIENT_CLINIC_OR_DEPARTMENT_OTHER)
Admission: EM | Admit: 2022-05-01 | Discharge: 2022-05-01 | Disposition: A | Discharge status: Home / Self Care | Payer: 59 | Attending: Emergency Medicine | Admitting: Emergency Medicine

## 2022-05-01 ENCOUNTER — Other Ambulatory Visit: Payer: Self-pay

## 2022-05-01 ENCOUNTER — Emergency Department (HOSPITAL_BASED_OUTPATIENT_CLINIC_OR_DEPARTMENT_OTHER): Payer: 59

## 2022-05-01 ENCOUNTER — Other Ambulatory Visit (HOSPITAL_COMMUNITY): Payer: Self-pay

## 2022-05-01 ENCOUNTER — Encounter (HOSPITAL_BASED_OUTPATIENT_CLINIC_OR_DEPARTMENT_OTHER): Payer: Self-pay

## 2022-05-01 DIAGNOSIS — E1165 Type 2 diabetes mellitus with hyperglycemia: Secondary | ICD-10-CM | POA: Diagnosis not present

## 2022-05-01 DIAGNOSIS — Z7984 Long term (current) use of oral hypoglycemic drugs: Secondary | ICD-10-CM | POA: Insufficient documentation

## 2022-05-01 DIAGNOSIS — R1010 Upper abdominal pain, unspecified: Secondary | ICD-10-CM | POA: Diagnosis present

## 2022-05-01 DIAGNOSIS — R748 Abnormal levels of other serum enzymes: Secondary | ICD-10-CM | POA: Diagnosis not present

## 2022-05-01 DIAGNOSIS — Z79899 Other long term (current) drug therapy: Secondary | ICD-10-CM | POA: Diagnosis not present

## 2022-05-01 DIAGNOSIS — R1013 Epigastric pain: Secondary | ICD-10-CM | POA: Diagnosis not present

## 2022-05-01 DIAGNOSIS — R079 Chest pain, unspecified: Secondary | ICD-10-CM | POA: Diagnosis not present

## 2022-05-01 DIAGNOSIS — I10 Essential (primary) hypertension: Secondary | ICD-10-CM | POA: Diagnosis not present

## 2022-05-01 DIAGNOSIS — Z7982 Long term (current) use of aspirin: Secondary | ICD-10-CM | POA: Insufficient documentation

## 2022-05-01 LAB — BASIC METABOLIC PANEL
Anion gap: 8 (ref 5–15)
BUN: 14 mg/dL (ref 6–20)
CO2: 22 mmol/L (ref 22–32)
Calcium: 8.7 mg/dL — ABNORMAL LOW (ref 8.9–10.3)
Chloride: 107 mmol/L (ref 98–111)
Creatinine, Ser: 0.79 mg/dL (ref 0.61–1.24)
GFR, Estimated: >60 mL/min (ref >60)
Glucose, Bld: 223 mg/dL — ABNORMAL HIGH (ref 70–99)
Potassium: 3.8 mmol/L (ref 3.5–5.1)
Sodium: 137 mmol/L (ref 135–145)

## 2022-05-01 LAB — TROPONIN I (HIGH SENSITIVITY)
Troponin I (High Sensitivity): 5 ng/L (ref <18)
Troponin I (High Sensitivity): 5 ng/L (ref <18)

## 2022-05-01 LAB — CBC
HCT: 44.4 % (ref 39.0–52.0)
Hemoglobin: 14.8 g/dL (ref 13.0–17.0)
MCH: 28.5 pg (ref 26.0–34.0)
MCHC: 33.3 g/dL (ref 30.0–36.0)
MCV: 85.4 fL (ref 80.0–100.0)
Platelets: 215 10*3/uL (ref 150–400)
RBC: 5.2 MIL/uL (ref 4.22–5.81)
RDW: 13 % (ref 11.5–15.5)
WBC: 7.8 10*3/uL (ref 4.0–10.5)
nRBC: 0 % (ref 0.0–0.2)

## 2022-05-01 LAB — LIPASE, BLOOD: Lipase: 67 U/L — ABNORMAL HIGH (ref 11–51)

## 2022-05-01 LAB — URINALYSIS, ROUTINE W REFLEX MICROSCOPIC
Bilirubin Urine: NEGATIVE
Glucose, UA: >=500 mg/dL — AB
Hgb urine dipstick: NEGATIVE
Ketones, ur: NEGATIVE mg/dL
Leukocytes,Ua: NEGATIVE
Nitrite: NEGATIVE
Protein, ur: NEGATIVE mg/dL
Specific Gravity, Urine: 1.01 (ref 1.005–1.030)
pH: 6 (ref 5.0–8.0)

## 2022-05-01 LAB — HEPATIC FUNCTION PANEL
ALT: 26 U/L (ref 0–44)
AST: 21 U/L (ref 15–41)
Albumin: 3.9 g/dL (ref 3.5–5.0)
Alkaline Phosphatase: 68 U/L (ref 38–126)
Bilirubin, Direct: <0.1 mg/dL (ref 0.0–0.2)
Total Bilirubin: 0.5 mg/dL (ref 0.3–1.2)
Total Protein: 6.8 g/dL (ref 6.5–8.1)

## 2022-05-01 LAB — URINALYSIS, MICROSCOPIC (REFLEX)

## 2022-05-01 MED ORDER — ALUM & MAG HYDROXIDE-SIMETH 200-200-20 MG/5ML PO SUSP
30.0000 mL | Freq: Once | ORAL | Status: AC
  Administered 2022-05-01: 30 mL via ORAL
  Filled 2022-05-01: qty 30

## 2022-05-01 MED ORDER — LIDOCAINE VISCOUS HCL 2 % MT SOLN
15.0000 mL | Freq: Once | OROMUCOSAL | Status: AC
  Administered 2022-05-01: 15 mL via OROMUCOSAL
  Filled 2022-05-01: qty 15

## 2022-05-01 MED ORDER — OMEPRAZOLE 20 MG PO CPDR
20.0000 mg | DELAYED_RELEASE_CAPSULE | Freq: Every day | ORAL | 3 refills | Status: AC
  Filled 2022-05-01: qty 30, 30d supply, fill #0

## 2022-05-01 MED ORDER — IOHEXOL 350 MG/ML SOLN
100.0000 mL | Freq: Once | INTRAVENOUS | Status: AC | PRN
  Administered 2022-05-01: 125 mL via INTRAVENOUS

## 2022-05-01 NOTE — ED Provider Notes (Signed)
[facility] Provider Note   CSN: [id number] Arrival date & time: 05/01/22  1023     History Medical history including morbid obesity, hypertension, diabetes type 2, hyperlipidemia, OSA, TIA, history of 4.1 cm thoracic ascending aortic aneurysm diagnosed in 2021. Chief Complaint  Patient presents with   Abdominal Pain   Chest Pain    Stephen Dunlap is a 51 y.o. male. He states that since Saturday he has noticed pressure in his upper abdomen that he rates a 7 out of 10 pain.  States this is an aching pain.  He has also noticed it radiating into his chest.  He does have a history of a ascending thoracic aortic aneurysm that he is being followed for.  His last CT image of this was in November 2022 and he is due for a repeat image next month.  He denies any shortness of breath, numbness, weakness, nausea, vomiting, constipation, diarrhea, fevers, chills, dizziness, or syncope.    Abdominal Pain Associated symptoms: chest pain   Chest Pain Associated symptoms: abdominal pain        Home Medications Prior to Admission medications   Medication Sig Start Date End Date Taking? Authorizing Provider  albuterol (PROVENTIL HFA;VENTOLIN HFA) 108 (90 BASE) MCG/ACT inhaler Inhale 2 puffs into the lungs every 6 (six) hours as needed for wheezing or shortness of breath. 03/02/15   Biagio Borg, MD  aspirin EC (ASPIRIN 81) 81 MG tablet TAKE 1 TABLET BY MOUTH ONCE DAILY. 12/24/21     aspirin EC 81 MG tablet Take 81 mg by mouth daily.    [provider]  atorvastatin (LIPITOR) 20 MG tablet Take 20 mg by mouth daily.    [provider]  atorvastatin (LIPITOR) 40 MG tablet TAKE 1 TABLET BY MOUTH ONCE DAILY 09/07/19 09/06/20  Reynold Bowen, MD  clonazePAM (KLONOPIN) 1 MG tablet Take 1 mg by mouth at bedtime.     [provider]  clonazePAM (KLONOPIN) 1 MG tablet TAKE 1 TABLET BY MOUTH AT BEDTIME 08/08/20 02/04/21  Reynold Bowen, MD  clonazePAM  (KLONOPIN) 1 MG tablet TAKE 1 TABLET BY MOUTH AT BEDTIME 11/28/21     COVID-19 At Home Antigen Test [facility] COVID-19 HOME TEST) KIT Use as directed 04/12/21   Edmon Crape, RPH  empagliflozin (JARDIANCE) 25 MG TABS tablet Take 25 mg by mouth daily.    [provider]  empagliflozin (JARDIANCE) 25 MG TABS tablet TAKE 1 TABLET BY MOUTH ONCE DAILY 03/14/22     EPINEPHrine (AUVI-Q) 0.3 mg/0.3 mL IJ SOAJ injection Inject 0.3 mg into the muscle once.    [provider]  ezetimibe (ZETIA) 10 MG tablet TAKE 1 TABLET BY MOUTH ONCE DAILY 04/05/20 04/05/21  Reynold Bowen, MD  ezetimibe (ZETIA) 10 MG tablet Take 1 tablet (10 mg total) by mouth daily. 04/09/22     fluconazole (DIFLUCAN) 150 MG tablet Take 1 tablet by mouth daily for 3 days as directed 02/12/21     fluticasone (FLONASE) 50 MCG/ACT nasal spray Place 1 spray into both nostrils daily as needed for allergies or rhinitis.    [provider]  HYDROcodone-acetaminophen (NORCO/VICODIN) 5-325 MG tablet Take 1-2 tablets by mouth every 4 (four) hours as needed for moderate pain ((score 4 to 6)). 02/09/18   Earnie Larsson, MD  ibuprofen (ADVIL,MOTRIN) 800 MG tablet Take 800 mg by mouth every 8 (eight) hours as needed for mild pain or moderate pain.     [provider]  JANUMET 50-1000 MG tablet Take 1 tablet by mouth 2 (two) times daily with a meal.  11/28/16   [provider]  ketoconazole (NIZORAL) 2 % cream Apply to the affected area daily as directed 02/12/21     losartan (COZAAR) 100 MG tablet Take 1 tablet (100 mg total) by mouth daily. 04/09/22     losartan (COZAAR) 50 MG tablet Take 1 tablet (50 mg total) by mouth daily. 04/05/21     molnupiravir EUA (LAGEVRIO) 200 MG CAPS capsule Take 4 capsules by mouth every 12 hrs for 5 days 04/15/21     montelukast (SINGULAIR) 10 MG tablet TAKE 1 TABLET BY MOUTH EACH NIGHT AT BEDTIME 12/25/17   Kozlow, Donnamarie Poag, MD  olopatadine (PATANOL) 0.1 % ophthalmic solution INSTILL 1 DROP INTO  BOTH EYES DAILY 12/25/17   Kozlow, Donnamarie Poag, MD  omeprazole (PRILOSEC) 20 MG capsule Take 1 capsule (20 mg total) by mouth daily. 05/01/22   Kataleyah Carducci, Adora Fridge, PA-C  pregabalin (LYRICA) 75 MG capsule TAKE 1 CAPSULE BY MOUTH 2 TIMES DAILY 05/31/20 11/27/20  Viona Gilmore D, NP  pregabalin (LYRICA) 75 MG capsule TAKE 1 CAPSULE BY MOUTH 2 TIMES DAILY 07/08/21     rosuvastatin (CRESTOR) 5 MG tablet Take 1 tablet by mouth twice a week 04/05/21     tirzepatide (MOUNJARO) 10 MG/0.5ML Pen Inject 0.5 mL into the skin once weekly. 10/25/21     tirzepatide (MOUNJARO) 12.5 MG/0.5ML Pen Inject 12.5 mg into the skin once weekly as directed 11/27/21     tirzepatide (MOUNJARO) 15 MG/0.5ML Pen Inject 15 mg into the skin once a week. 04/09/22     tirzepatide [facility]) 5 MG/0.5ML Pen Inject 31m (0.567m subcutaneous once a week 04/05/21     tirzepatide ([facility] MG/0.5ML Pen Inject 7.13m64mnder the skin once a week as directed 08/08/59/09  TRULICITY 1.5 MG/UE/4.5WUPN Inject 1.5 mg as directed every Monday. 01/11/18   [provider]      Allergies    Other and Invokana [canagliflozin]    Review of Systems   Review of Systems  Cardiovascular:  Positive for chest pain.  Gastrointestinal:  Positive for abdominal pain.  All other systems reviewed and are negative.   Physical Exam Updated Vital Signs BP (!) 152/111   Pulse 72   Temp 98.5 F (36.9 C) (Oral)   Resp (!) 0   Ht '6\' 5"'  (1.956 m)   Wt (!) 168.7 kg   SpO2 99%   BMI 44.11 kg/m  Physical Exam Vitals and nursing note reviewed.  Constitutional:      General: He is not in acute distress.    Appearance: Normal appearance. He is obese. He is not ill-appearing, toxic-appearing or diaphoretic.  HENT:     Head: Normocephalic and atraumatic.     Nose: No nasal deformity.     Mouth/Throat:     Lips: Pink. No lesions.     Mouth: Mucous membranes are moist. No injury, lacerations, oral lesions or angioedema.     Pharynx: Oropharynx is clear. Uvula  midline. No pharyngeal swelling, oropharyngeal exudate, posterior oropharyngeal erythema or uvula swelling.  Eyes:     General: Gaze aligned appropriately. No scleral icterus.       Right eye: No discharge.        Left eye: No discharge.     Conjunctiva/sclera: Conjunctivae normal.     Right eye: Right conjunctiva is not injected. No exudate or hemorrhage.    Left eye:  Left conjunctiva is not injected. No exudate or hemorrhage. Cardiovascular:     Rate and Rhythm: Normal rate and regular rhythm.     Pulses: Normal pulses.          Radial pulses are 2+ on the right side and 2+ on the left side.       Dorsalis pedis pulses are 2+ on the right side and 2+ on the left side.     Heart sounds: Normal heart sounds, S1 normal and S2 normal. Heart sounds not distant. No murmur heard.    No friction rub. No gallop. No S3 or S4 sounds.  Pulmonary:     Effort: Pulmonary effort is normal. No accessory muscle usage or respiratory distress.     Breath sounds: Normal breath sounds. No stridor. No wheezing, rhonchi or rales.  Chest:     Chest wall: No tenderness.  Abdominal:     General: Abdomen is flat. There is no distension.     Palpations: Abdomen is soft. There is no mass or pulsatile mass.     Tenderness: There is abdominal tenderness in the epigastric area. There is no right CVA tenderness, left CVA tenderness, guarding or rebound. Negative signs include Murphy's sign.     Hernia: No hernia is present.     Comments: No pulsatile mass  Musculoskeletal:     Right lower leg: No edema.     Left lower leg: No edema.  Skin:    General: Skin is warm and dry.     Coloration: Skin is not jaundiced or pale.     Findings: No bruising, erythema, lesion or rash.  Neurological:     General: No focal deficit present.     Mental Status: He is alert and oriented to person, place, and time.     GCS: GCS eye subscore is 4. GCS verbal subscore is 5. GCS motor subscore is 6.  Psychiatric:        Mood and  Affect: Mood normal.        Behavior: Behavior normal. Behavior is cooperative.     ED Results / Procedures / Treatments   Labs (all labs ordered are listed, but only abnormal results are displayed) Labs Reviewed  BASIC METABOLIC PANEL - Abnormal; Notable for the following components:      Result Value   Glucose, Bld 223 (*)    Calcium 8.7 (*)    All other components within normal limits  LIPASE, BLOOD - Abnormal; Notable for the following components:   Lipase 67 (*)    All other components within normal limits  URINALYSIS, ROUTINE W REFLEX MICROSCOPIC - Abnormal; Notable for the following components:   Glucose, UA >=500 (*)    All other components within normal limits  URINALYSIS, MICROSCOPIC (REFLEX) - Abnormal; Notable for the following components:   Bacteria, UA RARE (*)    All other components within normal limits  CBC  HEPATIC FUNCTION PANEL  TROPONIN I (HIGH SENSITIVITY)  TROPONIN I (HIGH SENSITIVITY)    EKG None  Radiology CT Angio Chest/Abd/Pel for Dissection W and/or Wo Contrast  Result Date: 05/01/2022 CLINICAL DATA:  Suspected acute aortic syndrome. Epigastric pain and chest pain EXAM: CT ANGIOGRAPHY CHEST, ABDOMEN AND PELVIS TECHNIQUE: Non-contrast CT of the chest was initially obtained. Multidetector CT imaging through the chest, abdomen and pelvis was performed using the standard protocol during bolus administration of intravenous contrast. Multiplanar reconstructed images and MIPs were obtained and reviewed to evaluate the vascular anatomy. RADIATION DOSE REDUCTION:  This exam was performed according to the departmental dose-optimization program which includes automated exposure control, adjustment of the mA and/or kV according to patient size and/or use of iterative reconstruction technique. CONTRAST:  183m OMNIPAQUE IOHEXOL 350 MG/ML SOLN COMPARISON:  None Available. FINDINGS: CTA CHEST FINDINGS Cardiovascular: No intramural hematoma within the thoracic aorta on  noncontrast series. Contrast series demonstrates no aortic dissection or aneurysm. Great vessels are normal. Bovine arch anatomy. No pericardial fluid. Mediastinum/Nodes: No axillary or supraclavicular adenopathy. No mediastinal or hilar adenopathy. No pericardial fluid. Esophagus normal. Lungs/Pleura: No pulmonary infarction. No pneumonia. No pleural fluid. No pneumothorax Concern for Musculoskeletal: Anterior cervical fusion. Acute osseous abnormality Review of the MIP images confirms the above findings. CTA ABDOMEN AND PELVIS FINDINGS VASCULAR Aorta: Normal caliber aorta without aneurysm, dissection, vasculitis or significant stenosis. Celiac: Patent without evidence of aneurysm, dissection, vasculitis or significant stenosis. SMA: Patent without evidence of aneurysm, dissection, vasculitis or significant stenosis. Renals: Both renal arteries are patent without evidence of aneurysm, dissection, vasculitis, fibromuscular dysplasia or significant stenosis. IMA: Patent without evidence of aneurysm, dissection, vasculitis or significant stenosis. Inflow: Patent without evidence of aneurysm, dissection, vasculitis or significant stenosis. Veins: No obvious venous abnormality within the limitations of this arterial phase study. Review of the MIP images confirms the above findings. NON-VASCULAR Hepatobiliary: No focal hepatic lesion. No biliary duct dilatation. Common bile duct is normal. Pancreas: Pancreas is normal. No ductal dilatation. No pancreatic inflammation. Spleen: Normal spleen Adrenals/urinary tract: Adrenal glands and kidneys are normal. The ureters and bladder normal. Stomach/Bowel: Stomach, small bowel, appendix, and cecum are normal. The colon and rectosigmoid colon are normal. Vascular/Lymphatic: Abdominal aorta is normal caliber. No periportal or retroperitoneal adenopathy. No pelvic adenopathy. Reproductive: Prostate unremarkable Other: No free fluid. Musculoskeletal: No aggressive osseous lesion.  Review of the MIP images confirms the above findings. IMPRESSION: Chest Impression: 1. No aortic dissection or aneurysm. 2. No acute pulmonary parenchymal findings. No acute cardiopulmonary findings. Abdomen / Pelvis Impression: 1. No abdominal aortic dissection or aneurysm. 2. No acute findings in the abdomen pelvis. 3. No explanation for epigastric pain. Electronically Signed   By: SSuzy BouchardM.D.   On: 05/01/2022 12:35   DG Chest Port 1 View  Result Date: 05/01/2022 CLINICAL DATA:  Chest pain EXAM: PORTABLE CHEST 1 VIEW COMPARISON:  Chest radiographs done on 06/11/2019 and CT done on 05/31/2021 FINDINGS: Transverse diameter of heart is increased. There are no signs of pulmonary edema or focal pulmonary consolidation. There is no pleural effusion or pneumothorax. There is previous surgical fusion in cervical spine. IMPRESSION: Cardiomegaly. There are no signs of pulmonary edema or focal pulmonary consolidation. Electronically Signed   By: PElmer PickerM.D.   On: 05/01/2022 11:21    Procedures Procedures  This patient was on telemetry or cardiac monitoring during their time in the ED.    Medications Ordered in ED Medications  alum & mag hydroxide-simeth (MAALOX/MYLANTA) 200-200-20 MG/5ML suspension 30 mL (30 mLs Oral Given 05/01/22 1055)  lidocaine (XYLOCAINE) 2 % viscous mouth solution 15 mL (15 mLs Mouth/Throat Given 05/01/22 1055)  iohexol (OMNIPAQUE) 350 MG/ML injection 100 mL (125 mLs Intravenous Contrast Given 05/01/22 1204)    ED Course/ Medical Decision Making/ A&P                           Medical Decision Making Amount and/or Complexity of Data Reviewed Labs: ordered. Radiology: ordered.  Risk OTC drugs. Prescription drug management.  MDM  This is a 51 y.o. male who presents to the ED with 5 days of progressively worsening epigastric abdominal pain radiating into his chest The differential of this patient includes but is not limited to aortic dissection, AAA,  GERD, PUD, ACS, Biliary Colic, Perforation, Bowel Obstruction, etc.  Initial Impression  Well appearing and in no acute distress with stable vitals Given history of thoracic aortic dissection and worsening symptoms, I feel that we need to rule out rupture or leakage of his aneurism as well as work up for causes of chest pain. For now, I have provided with GI cocktail.   I personally ordered, reviewed, and interpreted all laboratory work and imaging and agree with radiologist interpretation. Results interpreted below:  Disorder anemia noted.  Glucose is 223 without acidosis.  Renal function normal.  LFTs are normal.  He does have a minimally elevated lipase of 67.  Troponin is 5x2.  Urinalysis shows no sign of UTI.  Chest x-ray reveals a mildly enlarged heart, but no pneumonia, pneumothorax, or pulmonary edema.  CTA chest does not reveal any change in his aneurysm, no sign of bowel obstruction, no other acute intra-abdominal pathology. EKG is normal sinus rhythm  Assessment/Plan:  Overall this is a very reassuring work-up.  He did have a minimally elevated lipase, which does not seem high enough to be indicative of pancreatitis.  Presentation seems consistent with a gastrointestinal cause as he had symptom improvement after GI cocktail.  He is already taking omeprazole but he is only taking this as needed so I have told him to take this daily until he is reevaluated by his PCP.  No other emergent needs that were identified and has no admission indications.  He is stable for discharge.  He is given strict report precautions.   Charting Requirements Additional history is obtained from:  Independent historian External Records from outside source obtained and reviewed including: Prior CT chest imaging Social Determinants of Health:  none Pertinant PMH that complicates patient's illness: known Aortic Aneurism, Diabetes, HTN, Obesity  Patient Care Problems that were addressed during this visit: -  Epigastric Abdominal Pain: Acute illness with complication This patient was maintained on a cardiac monitor/telemetry. I personally viewed and interpreted the cardiac monitor which reveals an underlying rhythm of NSR Medications given in ED: GI cocktail Reevaluation of the patient after these medicines showed that the patient improved I have reviewed home medications and made changes accordingly.  Critical Care Interventions: n/a Consultations: n/a Disposition: discharge  This is a supervised visit with my attending physician, Dr. Mayra Neer. We have discussed this patient and they have altered the plan as needed.  Portions of this note were generated with [employer]. Dictation errors may occur despite best attempts at proofreading.       Final Clinical Impression(s) / ED Diagnoses Final diagnoses:  Epigastric abdominal pain    Rx / DC Orders ED Discharge Orders          Ordered    omeprazole (PRILOSEC) 20 MG capsule  Daily        05/01/22 1435              Bobbi Kozakiewicz, Adora Fridge, PA-C 05/01/22 1440    Audley Hose, MD 05/01/22 1539

## 2022-05-01 NOTE — ED Triage Notes (Signed)
Patient c/o sternal and epigastric pain x Sunday. States he feels "bloated" states pain is 7/10.

## 2022-05-01 NOTE — ED Notes (Signed)
Patient off the floor for scan  

## 2022-05-01 NOTE — Discharge Instructions (Signed)
Work-up today was very reassuring.  You had a minimally elevated lipase which is a marker of your pancreas.  I do not feel that this is high enough to be medically relevant but your PCP may want to know about this.  We did a CTA chest abdomen and pelvis which did not show any abnormalities of your aortic aneurysm or any signs of bowel obstruction.  I suspect your symptoms are gastrointestinal. You should start taking your Omeprazole daily instead of as needed until you are seen by your PCP. They can determine further management at that point.

## 2022-05-09 ENCOUNTER — Other Ambulatory Visit (HOSPITAL_COMMUNITY): Payer: Self-pay

## 2022-05-16 ENCOUNTER — Other Ambulatory Visit (HOSPITAL_COMMUNITY): Payer: Self-pay

## 2022-05-16 DIAGNOSIS — M7061 Trochanteric bursitis, right hip: Secondary | ICD-10-CM | POA: Diagnosis not present

## 2022-05-16 DIAGNOSIS — M25551 Pain in right hip: Secondary | ICD-10-CM | POA: Diagnosis not present

## 2022-05-16 MED ORDER — DICLOFENAC SODIUM 75 MG PO TBEC
75.0000 mg | DELAYED_RELEASE_TABLET | Freq: Two times a day (BID) | ORAL | 1 refills | Status: DC
  Filled 2022-05-16: qty 60, 30d supply, fill #0

## 2022-05-27 ENCOUNTER — Ambulatory Visit: Payer: 59 | Admitting: Podiatry

## 2022-05-27 ENCOUNTER — Ambulatory Visit: Payer: 59

## 2022-05-27 ENCOUNTER — Other Ambulatory Visit (HOSPITAL_COMMUNITY): Payer: Self-pay

## 2022-05-27 DIAGNOSIS — M19072 Primary osteoarthritis, left ankle and foot: Secondary | ICD-10-CM

## 2022-05-27 DIAGNOSIS — Z79899 Other long term (current) drug therapy: Secondary | ICD-10-CM

## 2022-05-27 DIAGNOSIS — B351 Tinea unguium: Secondary | ICD-10-CM | POA: Diagnosis not present

## 2022-05-27 DIAGNOSIS — M79672 Pain in left foot: Secondary | ICD-10-CM

## 2022-05-27 MED ORDER — AMMONIUM LACTATE 12 % EX LOTN
1.0000 | TOPICAL_LOTION | CUTANEOUS | 0 refills | Status: AC | PRN
  Filled 2022-05-27: qty 452, 30d supply, fill #0

## 2022-05-27 NOTE — Patient Instructions (Signed)
Terbinafine Tablets What is this medication? TERBINAFINE (TER bin a feen) treats fungal infections of the nails. It belongs to a group of medications called antifungals. It will not treat infections caused by bacteria or viruses. This medicine may be used for other purposes; ask your health care provider or pharmacist if you have questions. COMMON BRAND NAME(S): Lamisil, Terbinex What should I tell my care team before I take this medication? They need to know if you have any of these conditions: Liver disease An unusual or allergic reaction to terbinafine, other medications, foods, dyes, or preservatives Pregnant or trying to get pregnant Breast-feeding How should I use this medication? Take this medication by mouth with water. Take it as directed on the prescription label at the same time every day. You can take it with or without food. If it upsets your stomach, take it with food. Keep taking it unless your care team tells you to stop. A special MedGuide will be given to you by the pharmacist with each prescription and refill. Be sure to read this information carefully each time. Talk to your care team regarding the use of this medication in children. Special care may be needed. Overdosage: If you think you have taken too much of this medicine contact a poison control center or emergency room at once. NOTE: This medicine is only for you. Do not share this medicine with others. What if I miss a dose? If you miss a dose, take it as soon as you can unless it is more than 4 hours late. If it is more than 4 hours late, skip the missed dose. Take the next dose at the normal time. What may interact with this medication? Do not take this medication with any of the following: Pimozide Thioridazine This medication may also interact with the following: Beta blockers Caffeine Certain medications for mental health conditions Cimetidine Cyclosporine Medications for fungal infections like fluconazole  and ketoconazole Medications for irregular heartbeat like amiodarone, flecainide and propafenone Rifampin Warfarin This list may not describe all possible interactions. Give your health care provider a list of all the medicines, herbs, non-prescription drugs, or dietary supplements you use. Also tell them if you smoke, drink alcohol, or use illegal drugs. Some items may interact with your medicine. What should I watch for while using this medication? Visit your care team for regular checks on your progress. You may need blood work while you are taking this medication. It may be some time before you see the benefit from this medication. This medication may cause serious skin reactions. They can happen weeks to months after starting the medication. Contact your care team right away if you notice fevers or flu-like symptoms with a rash. The rash may be red or purple and then turn into blisters or peeling of the skin. Or, you might notice a red rash with swelling of the face, lips or lymph nodes in your neck or under your arms. This medication can make you more sensitive to the sun. Keep out of the sun, If you cannot avoid being in the sun, wear protective clothing and sunscreen. Do not use sun lamps or tanning beds/booths. What side effects may I notice from receiving this medication? Side effects that you should report to your care team as soon as possible: Allergic reactions--skin rash, itching, hives, swelling of the face, lips, tongue, or throat Change in sense of smell Change in taste Infection--fever, chills, cough, or sore throat Liver injury--right upper belly pain, loss of appetite, nausea,   light-colored stool, dark yellow or brown urine, yellowing skin or eyes, unusual weakness or fatigue Low red blood cell level--unusual weakness or fatigue, dizziness, headache, trouble breathing Lupus-like syndrome--joint pain, swelling, or stiffness, butterfly-shaped rash on the face, rashes that get worse  in the sun, fever, unusual weakness or fatigue Rash, fever, and swollen lymph nodes Redness, blistering, peeling, or loosening of the skin, including inside the mouth Unusual bruising or bleeding Worsening mood, feelings of depression Side effects that usually do not require medical attention (report to your care team if they continue or are bothersome): Diarrhea Gas Headache Nausea Stomach pain Upset stomach This list may not describe all possible side effects. Call your doctor for medical advice about side effects. You may report side effects to FDA at 1-800-FDA-1088. Where should I keep my medication? Keep out of the reach of children and pets. Store between 20 and 25 degrees C (68 and 77 degrees F). Protect from light. Get rid of any unused medication after the expiration date. To get rid of medications that are no longer needed or have expired: Take the medication to a medication take-back program. Check with your pharmacy or law enforcement to find a location. If you cannot return the medication, check the label or package insert to see if the medication should be thrown out in the garbage or flushed down the toilet. If you are not sure, ask your care team. If it is safe to put it in the trash, take the medication out of the container. Mix the medication with cat litter, dirt, coffee grounds, or other unwanted substance. Seal the mixture in a bag or container. Put it in the trash. NOTE: This sheet is a summary. It may not cover all possible information. If you have questions about this medicine, talk to your doctor, pharmacist, or health care provider.  2023 Elsevier/Gold Standard (2021-02-05 00:00:00)  

## 2022-05-27 NOTE — Progress Notes (Signed)
Subjective:   Patient ID: Stephen Dunlap, male   DOB: 51 y.o.   MRN: [id number]   HPI Chief Complaint  Patient presents with   Diabetes    Diabetic A1c- 7.1 BG- 138 (yesterday) Nail fungus, Left foot swelling and Patient denies any pain, patient had surgery in 2021 left foot bunionectomy,      51 year old male presents above concerns.  He has had swelling since the surgery. He staes it may be a little bit better but still swollen since hte surgery. No pain associated with the swelling.   He has a wart on the bottom of the left foot. He uses a pumice stone to get some releft  Has nail fungus, no treatment.   No recent injuries.     Review of Systems  All other systems reviewed and are negative.  Past Medical History:  Diagnosis Date   COVID 05/2019   high fever of 103 body aches x 2 weeks all symptoms resolved   DM type 2 (diabetes mellitus, type 2) ([place])    Morbid obesity ([place])    Prostatitis    Sleep apnea    CPAP machine, 4-10 pressure settings   Thoracic ascending aortic aneurysm (HCC)    4 cm per chest angio ct 05-18-2020 followed by dr Remo Lipps [facility]on [facility]  for   TIA (transient ischemic attack)    October 2018, possible?   UTI (urinary tract infection)     Past Surgical History:  Procedure Laterality Date   ANTERIOR CERVICAL DECOMP/DISCECTOMY FUSION N/A 02/08/2018   Procedure: Anterior Cervical Decompression Fusion - Cervical Six-Cervical Seven - Cervical Seven -Thoracic One;  Surgeon: Earnie Larsson, MD;  Location: [facility];  Service: Neurosurgery;  Laterality: N/A;   BUNIONECTOMY Left 06/25/2020   Procedure: BUNIONECTOMY AND AKIN;  Surgeon: Rosemary Holms, DPM;  Location: [facility];  Service: Podiatry;  Laterality: Left;   cysto bedside  yrs ago   HAMMER TOE SURGERY Left 06/25/2020   Procedure: HAMMER TOE REPAIR;  Surgeon: Rosemary Holms, DPM;  Location: [facility];  Service: Podiatry;  Laterality: Left;   HERNIA REPAIR  2094    umbilical   WISDOM TOOTH EXTRACTION  yrs ago     Current Outpatient Medications:    ammonium lactate (AMLACTIN) 12 % lotion, Apply 1 Application topically as needed for dry skin., Disp: 452 g, Rfl: 0   albuterol (PROVENTIL HFA;VENTOLIN HFA) 108 (90 BASE) MCG/ACT inhaler, Inhale 2 puffs into the lungs every 6 (six) hours as needed for wheezing or shortness of breath., Disp: 1 Inhaler, Rfl: 2   aspirin EC (ASPIRIN 81) 81 MG tablet, TAKE 1 TABLET BY MOUTH ONCE DAILY., Disp: 90 tablet, Rfl: 4   atorvastatin (LIPITOR) 40 MG tablet, TAKE 1 TABLET BY MOUTH ONCE DAILY, Disp: 90 tablet, Rfl: 1   clonazePAM (KLONOPIN) 1 MG tablet, TAKE 1 TABLET BY MOUTH AT BEDTIME, Disp: 90 tablet, Rfl: 1   COVID-19 At Home Antigen Test (CARESTART COVID-19 HOME TEST) KIT, Use as directed, Disp: 4 each, Rfl: 0   diclofenac (VOLTAREN) 75 MG EC tablet, Take 1 tablet (75 mg total) by mouth 2 (two) times daily with meals., Disp: 60 tablet, Rfl: 1   empagliflozin (JARDIANCE) 25 MG TABS tablet, TAKE 1 TABLET BY MOUTH ONCE DAILY, Disp: 90 tablet, Rfl: 0   EPINEPHrine (AUVI-Q) 0.3 mg/0.3 mL IJ SOAJ injection, Inject 0.3 mg into the muscle once., Disp: , Rfl:    ezetimibe (ZETIA) 10 MG tablet, Take 1 tablet (10  mg total) by mouth daily., Disp: 90 tablet, Rfl: 3   HYDROcodone-acetaminophen (NORCO/VICODIN) 5-325 MG tablet, Take 1-2 tablets by mouth every 4 (four) hours as needed for moderate pain ((score 4 to 6))., Disp: 50 tablet, Rfl: 0   ibuprofen (ADVIL,MOTRIN) 800 MG tablet, Take 800 mg by mouth every 8 (eight) hours as needed for mild pain or moderate pain. , Disp: , Rfl:    JANUMET 50-1000 MG tablet, Take 1 tablet by mouth 2 (two) times daily with a meal. , Disp: , Rfl: 7   ketoconazole (NIZORAL) 2 % cream, Apply to the affected area daily as directed, Disp: 30 g, Rfl: 0   losartan (COZAAR) 50 MG tablet, Take 1 tablet (50 mg total) by mouth daily., Disp: 90 tablet, Rfl: 3   molnupiravir EUA (LAGEVRIO) 200 MG CAPS capsule, Take  4 capsules by mouth every 12 hrs for 5 days, Disp: 40 capsule, Rfl: 0   montelukast (SINGULAIR) 10 MG tablet, TAKE 1 TABLET BY MOUTH EACH NIGHT AT BEDTIME, Disp: 30 tablet, Rfl: 0   olopatadine (PATANOL) 0.1 % ophthalmic solution, INSTILL 1 DROP INTO BOTH EYES DAILY, Disp: 5 mL, Rfl: 0   omeprazole (PRILOSEC) 20 MG capsule, Take 1 capsule (20 mg total) by mouth daily., Disp: 90 capsule, Rfl: 3   pregabalin (LYRICA) 75 MG capsule, TAKE 1 CAPSULE BY MOUTH 2 TIMES DAILY, Disp: 60 capsule, Rfl: 2   terbinafine (LAMISIL) 250 MG tablet, Take 1 tablet (250 mg total) by mouth daily., Disp: 90 tablet, Rfl: 0   tirzepatide (MOUNJARO) 10 MG/0.5ML Pen, Inject 0.5 mL into the skin once weekly., Disp: 2 mL, Rfl: 0   tirzepatide (MOUNJARO) 12.5 MG/0.5ML Pen, Inject 12.5 mg into the skin once weekly as directed, Disp: 2 mL, Rfl: 5   tirzepatide (MOUNJARO) 15 MG/0.5ML Pen, Inject 15 mg into the skin once a week., Disp: 6 mL, Rfl: 5   tirzepatide (MOUNJARO) 5 MG/0.5ML Pen, Inject 23m (0.55m subcutaneous once a week, Disp: 6 mL, Rfl: 3  Allergies  Allergen Reactions   Other Anaphylaxis    Tree nuts   Invokana [Canagliflozin] Other (See Comments)    Yeast infection           Objective:  Physical Exam  General: AAO x3, NAD  Dermatological: Nails are hypertrophic, dystrophic, brittle, discolored, elongated 10. No surrounding redness or drainage.No open lesions or pre-ulcerative lesions are identified today.  Vascular: Dorsalis Pedis artery and Posterior Tibial artery pedal pulses are 2/4 bilateral with immedate capillary fill time. There is no pain with calf compression, swelling, warmth, erythema.   Neruologic: Grossly intact via light touch bilateral.   Musculoskeletal: Mild edema to the foot mostly on the first MPJ but there is no area of tenderness noted otherwise.  Flexor, extensor tendons appear to be intact.  MMT 5/5.  Gait: Unassisted, Nonantalgic.       Assessment:   [age]ear old male  with onychomycosis; arthritis     Plan:  -Treatment options discussed including all alternatives, risks, and complications -Etiology of symptoms were discussed -X-rays were obtained and reviewed with the patient.  Decreased calcaneal inclination angle.  No evidence of acute fracture.  Arthritis present of the midfoot.  Previous bunionectomy noted. -Discussed treatment options for nail fungus including oral, topical as well as alternative treatments.  At this time the patient wants to proceed with oral Lamisil.  We discussed side effects of the medication and success rates.  We will check a CBC and LFT prior to starting the  medication.  Once I receive the results of this I will then call the medication in.  -He is already using Voltaren tablets.  Not using using 2 topical.  Discussed using good arch support as well.    Trula Slade DPM

## 2022-05-28 ENCOUNTER — Other Ambulatory Visit: Payer: Self-pay | Admitting: Podiatry

## 2022-05-28 ENCOUNTER — Other Ambulatory Visit (HOSPITAL_COMMUNITY): Payer: Self-pay

## 2022-05-28 DIAGNOSIS — Z79899 Other long term (current) drug therapy: Secondary | ICD-10-CM

## 2022-05-28 LAB — CBC WITH DIFFERENTIAL/PLATELET
Absolute Monocytes: 518 {cells}/uL (ref 200–950)
Basophils Absolute: 59 {cells}/uL (ref 0–200)
Basophils Relative: 0.8 %
Eosinophils Absolute: 67 {cells}/uL (ref 15–500)
Eosinophils Relative: 0.9 %
HCT: 44.2 % (ref 38.5–50.0)
Hemoglobin: 15 g/dL (ref 13.2–17.1)
Lymphs Abs: 2509 {cells}/uL (ref 850–3900)
MCH: 28.6 pg (ref 27.0–33.0)
MCHC: 33.9 g/dL (ref 32.0–36.0)
MCV: 84.4 fL (ref 80.0–100.0)
MPV: 11.6 fL (ref 7.5–12.5)
Monocytes Relative: 7 %
Neutro Abs: 4248 {cells}/uL (ref 1500–7800)
Neutrophils Relative %: 57.4 %
Platelets: 243 Thousand/uL (ref 140–400)
RBC: 5.24 Million/uL (ref 4.20–5.80)
RDW: 13 % (ref 11.0–15.0)
Total Lymphocyte: 33.9 %
WBC: 7.4 Thousand/uL (ref 3.8–10.8)

## 2022-05-28 LAB — HEPATIC FUNCTION PANEL
AG Ratio: 1.9 (calc) (ref 1.0–2.5)
ALT: 22 U/L (ref 9–46)
AST: 15 U/L (ref 10–35)
Albumin: 4.3 g/dL (ref 3.6–5.1)
Alkaline phosphatase (APISO): 84 U/L (ref 35–144)
Bilirubin, Direct: 0.1 mg/dL (ref 0.0–0.2)
Globulin: 2.3 g/dL (calc) (ref 1.9–3.7)
Indirect Bilirubin: 0.3 mg/dL (calc) (ref 0.2–1.2)
Total Bilirubin: 0.4 mg/dL (ref 0.2–1.2)
Total Protein: 6.6 g/dL (ref 6.1–8.1)

## 2022-05-28 MED ORDER — TERBINAFINE HCL 250 MG PO TABS
250.0000 mg | ORAL_TABLET | Freq: Every day | ORAL | 0 refills | Status: DC
  Filled 2022-05-28: qty 30, 30d supply, fill #0
  Filled 2022-09-05: qty 30, 30d supply, fill #1

## 2022-05-29 NOTE — Progress Notes (Signed)
[address]       [place] [address]             [phone]    PCP is Reynold Bowen, MD Referring Provider is Reynold Bowen, MD  Chief Complaint: Ascending thoracic aneurysm   HPI: This is a 51 year old male with a past medical history of TIA, OSA (on CPAP), diabetes mellitus, morbid obesity, and prostatitis who was incidentally found to have borderline ascending thoracic aortic aneurysm during COVID-19 work-up in November 2020.  He was last seen in the office 06/04/2021 for yearly surveillance of  ATAA (it was stable at 4.1 cm). Of note, he has been on Mounjaro for his diabetes and has decreased his weight from 465 pounds to 372 pounds. He was seen in the ED on 05/01/2022 with complaints of sternal and epigastric pain. CTA showed no dissection and no aneurysm. He was given a GI cocktail with improvement and discharged. He denies chest pain, pressure, or shortness of breath.    Past Medical History:  Diagnosis Date   COVID 05/2019   high fever of 103 body aches x 2 weeks all symptoms resolved   DM type 2 (diabetes mellitus, type 2) ([place])    Morbid obesity ([place])    Prostatitis    Sleep apnea    CPAP machine, 4-10 pressure settings   Thoracic ascending aortic aneurysm (HCC)    4 cm per chest angio ct 05-18-2020 followed by dr Remo Lipps hendrickson [facility]ic  for   TIA (transient ischemic attack)    October 2018, possible?   UTI (urinary tract infection)     Past Surgical History:  Procedure Laterality Date   ANTERIOR CERVICAL DECOMP/DISCECTOMY FUSION N/A 02/08/2018   Procedure: Anterior Cervical Decompression Fusion - Cervical Six-Cervical Seven - Cervical Seven -Thoracic One;  Surgeon: Earnie Larsson, MD;  Location: [facility];  Service: Neurosurgery;  Laterality: N/A;   BUNIONECTOMY Left 06/25/2020   Procedure: BUNIONECTOMY AND AKIN;  Surgeon: Rosemary Holms, DPM;  Location: [facility];  Service: Podiatry;  Laterality: Left;   cysto bedside  yrs  ago   HAMMER TOE SURGERY Left 06/25/2020   Procedure: HAMMER TOE REPAIR;  Surgeon: Rosemary Holms, DPM;  Location: [facility];  Service: Podiatry;  Laterality: Left;   HERNIA REPAIR  8250   umbilical   WISDOM TOOTH EXTRACTION  yrs ago    Family History  Problem Relation Age of Onset   Arthritis Other    Cancer Other        breast   Diabetes Other    Hypertension Other    Hyperlipidemia Other     Social History Social History   Tobacco Use   Smoking status: Never   Smokeless tobacco: Never  Vaping Use   Vaping Use: Never used  Substance Use Topics   Alcohol use: Yes    Alcohol/week: 2.0 standard drinks of alcohol    Types: 2 Cans of beer per week    Comment: Socially, not weekly   Drug use: No    Current Outpatient Medications  Medication Sig Dispense Refill   albuterol (PROVENTIL HFA;VENTOLIN HFA) 108 (90 BASE) MCG/ACT inhaler Inhale 2 puffs into the lungs every 6 (six) hours as needed for wheezing or shortness of breath. 1 Inhaler 2   ammonium lactate (AMLACTIN) 12 % lotion Apply 1 Application topically as needed for dry skin. 452 g 0   aspirin EC (ASPIRIN 81) 81 MG tablet TAKE 1 TABLET BY  MOUTH ONCE DAILY. 90 tablet 4   atorvastatin (LIPITOR) 40 MG tablet TAKE 1 TABLET BY MOUTH ONCE DAILY 90 tablet 1   clonazePAM (KLONOPIN) 1 MG tablet TAKE 1 TABLET BY MOUTH AT BEDTIME 90 tablet 1   COVID-19 At Home Antigen Test (CARESTART COVID-19 HOME TEST) KIT Use as directed 4 each 0   diclofenac (VOLTAREN) 75 MG EC tablet Take 1 tablet (75 mg total) by mouth 2 (two) times daily with meals. 60 tablet 1   empagliflozin (JARDIANCE) 25 MG TABS tablet TAKE 1 TABLET BY MOUTH ONCE DAILY 90 tablet 0   EPINEPHrine (AUVI-Q) 0.3 mg/0.3 mL IJ SOAJ injection Inject 0.3 mg into the muscle once.     ezetimibe (ZETIA) 10 MG tablet Take 1 tablet (10 mg total) by mouth daily. 90 tablet 3   HYDROcodone-acetaminophen (NORCO/VICODIN) 5-325 MG tablet Take 1-2 tablets by mouth every 4  (four) hours as needed for moderate pain ((score 4 to 6)). 50 tablet 0   ibuprofen (ADVIL,MOTRIN) 800 MG tablet Take 800 mg by mouth every 8 (eight) hours as needed for mild pain or moderate pain.      JANUMET 50-1000 MG tablet Take 1 tablet by mouth 2 (two) times daily with a meal.   7   ketoconazole (NIZORAL) 2 % cream Apply to the affected area daily as directed 30 g 0   losartan (COZAAR) 50 MG tablet Take 1 tablet (50 mg total) by mouth daily. 90 tablet 3   molnupiravir EUA (LAGEVRIO) 200 MG CAPS capsule Take 4 capsules by mouth every 12 hrs for 5 days 40 capsule 0   montelukast (SINGULAIR) 10 MG tablet TAKE 1 TABLET BY MOUTH EACH NIGHT AT BEDTIME 30 tablet 0   olopatadine (PATANOL) 0.1 % ophthalmic solution INSTILL 1 DROP INTO BOTH EYES DAILY 5 mL 0   omeprazole (PRILOSEC) 20 MG capsule Take 1 capsule (20 mg total) by mouth daily. 90 capsule 3   pregabalin (LYRICA) 75 MG capsule TAKE 1 CAPSULE BY MOUTH 2 TIMES DAILY 60 capsule 2   terbinafine (LAMISIL) 250 MG tablet Take 1 tablet (250 mg total) by mouth daily. 90 tablet 0   tirzepatide (MOUNJARO) 10 MG/0.5ML Pen Inject 0.5 mL into the skin once weekly. 2 mL 0   tirzepatide (MOUNJARO) 12.5 MG/0.5ML Pen Inject 12.5 mg into the skin once weekly as directed 2 mL 5   tirzepatide (MOUNJARO) 15 MG/0.5ML Pen Inject 15 mg into the skin once a week. 6 mL 5   tirzepatide (MOUNJARO) 5 MG/0.5ML Pen Inject 59m (0.562m subcutaneous once a week 6 mL 3  Vital Signs: Vitals:   06/05/22 1252  BP: (!) 160/100  Pulse: 95  Resp: 20  SpO2: 96%      Allergies  Allergen Reactions   Other Anaphylaxis    Tree nuts   Invokana [Canagliflozin] Other (See Comments)    Yeast infection   Review of Systems  Chest Pain [ N ] Resting SOB [ N] Exertional SOB [ N ]  Pedal Edema [ N ] Syncope [  N]  General Review of Systems: [Y] = yes [ N]=no  Consitutional:   nausea [ N];  fever [ N];  Eye : blurred vision [ N]; Amaurosis fugax[ N ];  Resp:  hemoptysis[ N];   GI: vomiting[ N]; melena[ N]; hematochezia [N];  GUWC:BJSEGBTDV[]; Heme/Lymph: anemia[ N];  Neuro: TIA[ Y];  seizures[N ];  Endocrine: diabetes[ Y];   Physical Exam: CV-RRR, no murmur Neck-No carotid bruit Pulmonary-Clear to auscultation  bilaterally Abdomen-Soft, non tender, bowel sounds present Extremities-No LE edema Neurologic-Grossly intact without focal deficits   Diagnostic Tests: Narrative & Impression  CLINICAL DATA:  Suspected acute aortic syndrome. Epigastric pain and chest pain   EXAM: CT ANGIOGRAPHY CHEST, ABDOMEN AND PELVIS   TECHNIQUE: Non-contrast CT of the chest was initially obtained.   Multidetector CT imaging through the chest, abdomen and pelvis was performed using the standard protocol during bolus administration of intravenous contrast. Multiplanar reconstructed images and MIPs were obtained and reviewed to evaluate the vascular anatomy.   RADIATION DOSE REDUCTION: This exam was performed according to the departmental dose-optimization program which includes automated exposure control, adjustment of the mA and/or kV according to patient size and/or use of iterative reconstruction technique.   CONTRAST:  121m OMNIPAQUE IOHEXOL 350 MG/ML SOLN   COMPARISON:  None Available.   FINDINGS: CTA CHEST FINDINGS   Cardiovascular: No intramural hematoma within the thoracic aorta on noncontrast series. Contrast series demonstrates no aortic dissection or aneurysm. Great vessels are normal. Bovine arch anatomy. No pericardial fluid.   Mediastinum/Nodes: No axillary or supraclavicular adenopathy. No mediastinal or hilar adenopathy. No pericardial fluid. Esophagus normal.   Lungs/Pleura: No pulmonary infarction. No pneumonia. No pleural fluid. No pneumothorax Concern for   Musculoskeletal: Anterior cervical fusion. Acute osseous abnormality   Review of the MIP images confirms the above findings.   CTA ABDOMEN AND PELVIS FINDINGS   VASCULAR    Aorta: Normal caliber aorta without aneurysm, dissection, vasculitis or significant stenosis.   Celiac: Patent without evidence of aneurysm, dissection, vasculitis or significant stenosis.   SMA: Patent without evidence of aneurysm, dissection, vasculitis or significant stenosis.   Renals: Both renal arteries are patent without evidence of aneurysm, dissection, vasculitis, fibromuscular dysplasia or significant stenosis.   IMA: Patent without evidence of aneurysm, dissection, vasculitis or significant stenosis.   Inflow: Patent without evidence of aneurysm, dissection, vasculitis or significant stenosis.   Veins: No obvious venous abnormality within the limitations of this arterial phase study.   Review of the MIP images confirms the above findings.   NON-VASCULAR   Hepatobiliary: No focal hepatic lesion. No biliary duct dilatation. Common bile duct is normal.   Pancreas: Pancreas is normal. No ductal dilatation. No pancreatic inflammation.   Spleen: Normal spleen   Adrenals/urinary tract: Adrenal glands and kidneys are normal. The ureters and bladder normal.   Stomach/Bowel: Stomach, small bowel, appendix, and cecum are normal. The colon and rectosigmoid colon are normal.   Vascular/Lymphatic: Abdominal aorta is normal caliber. No periportal or retroperitoneal adenopathy. No pelvic adenopathy.   Reproductive: Prostate unremarkable   Other: No free fluid.   Musculoskeletal: No aggressive osseous lesion.   Review of the MIP images confirms the above findings.   IMPRESSION: Chest Impression:   1. No aortic dissection or aneurysm. 2. No acute pulmonary parenchymal findings. No acute cardiopulmonary findings.   Abdomen / Pelvis Impression:   1. No abdominal aortic dissection or aneurysm. 2. No acute findings in the abdomen pelvis. 3. No explanation for epigastric pain.     Electronically Signed   By: SSuzy BouchardM.D.   On: 05/01/2022 12:35     Impression and Plan: We discussed the findings on CTA done in ED 05/01/2022. Although, it was mentioned he did not have a dissection or ATAA, most likely the aorta was not measured. I reviewed these images with Dr. HRoxan Hockeyand previous CTA. The patient's ATAA measured 4.1 cm so it is stable. Initially, his BP taken was  160/100. He stated he is under more stress and holidays are hard because of a personal loss. His wife is a Marine scientist and their diet is low to no salt and she monitors his blood pressure. Repeat BP in the office was 125/87, which he states is "around where it normally is". We reviewed the risk modifications for ATAA (please see AVS for summary of these). We will obtain a CTA and follow up with [facility] in one year.

## 2022-06-05 ENCOUNTER — Encounter: Payer: Self-pay | Admitting: Physician Assistant

## 2022-06-05 ENCOUNTER — Ambulatory Visit (INDEPENDENT_AMBULATORY_CARE_PROVIDER_SITE_OTHER): Payer: 59 | Admitting: Physician Assistant

## 2022-06-05 VITALS — BP 160/100 | HR 95 | Resp 20 | Ht 77.0 in | Wt 372.0 lb

## 2022-06-05 DIAGNOSIS — I7121 Aneurysm of the ascending aorta, without rupture: Secondary | ICD-10-CM | POA: Diagnosis not present

## 2022-06-05 NOTE — Patient Instructions (Addendum)
Risk Modification in those with ascending thoracic aortic aneurysm:  Continue good control of blood pressure (prefer SBP 130/80 or less)-BP elevated at this visit. He has been dealing with  more stress of late and holidays . On Losartan 50 mg daily. He has a PCP and wife is a Marine scientist and checks his BP. We also discussed using little to no salt in diet, which he already does.  2. Avoid fluoroquinolone antibiotics (I.e Ciprofloxacin, Avelox, Levofloxacin, Ofloxacin)  3.  Use of statin (to decrease cardiovascular risk)-continue Lipitor  4.  Exercise and activity limitations is individualized, but in general, contact sports are to be  avoided and one should avoid heavy lifting (defined as half of ideal body weight) and exercises involving sustained Valsalva maneuver. Patient only lifts light weights  5. Counseling for those suspected of having genetically mediated disease. First-degree relatives of those with TAA disease should be screened as well as those who have a connective tissue disease (I.e with Marfan syndrome, Ehlers-Danlos syndrome,  and Loeys-Dietz syndrome) or a  bicuspid aortic valve,have an increased risk for  complications related to TAA. The aforementioned does not apply to him  6. He has no history of tobacco use

## 2022-07-07 ENCOUNTER — Other Ambulatory Visit (HOSPITAL_COMMUNITY): Payer: Self-pay

## 2022-07-11 ENCOUNTER — Other Ambulatory Visit (HOSPITAL_COMMUNITY): Payer: Self-pay

## 2022-07-11 MED ORDER — OSELTAMIVIR PHOSPHATE 75 MG PO CAPS
75.0000 mg | ORAL_CAPSULE | Freq: Two times a day (BID) | ORAL | 0 refills | Status: DC
  Filled 2022-07-11: qty 10, 5d supply, fill #0

## 2022-07-15 ENCOUNTER — Other Ambulatory Visit (HOSPITAL_COMMUNITY): Payer: Self-pay

## 2022-08-05 ENCOUNTER — Other Ambulatory Visit (HOSPITAL_COMMUNITY): Payer: Self-pay

## 2022-08-05 MED ORDER — AMOXICILLIN-POT CLAVULANATE 875-125 MG PO TABS
1.0000 | ORAL_TABLET | Freq: Two times a day (BID) | ORAL | 0 refills | Status: DC
  Filled 2022-08-05: qty 14, 7d supply, fill #0

## 2022-08-28 ENCOUNTER — Other Ambulatory Visit (HOSPITAL_COMMUNITY): Payer: Self-pay

## 2022-08-28 ENCOUNTER — Other Ambulatory Visit: Payer: Self-pay

## 2022-08-28 DIAGNOSIS — H5203 Hypermetropia, bilateral: Secondary | ICD-10-CM | POA: Diagnosis not present

## 2022-08-28 DIAGNOSIS — H2523 Age-related cataract, morgagnian type, bilateral: Secondary | ICD-10-CM | POA: Diagnosis not present

## 2022-08-28 DIAGNOSIS — E119 Type 2 diabetes mellitus without complications: Secondary | ICD-10-CM | POA: Diagnosis not present

## 2022-08-28 DIAGNOSIS — H35373 Puckering of macula, bilateral: Secondary | ICD-10-CM | POA: Diagnosis not present

## 2022-08-28 DIAGNOSIS — H35033 Hypertensive retinopathy, bilateral: Secondary | ICD-10-CM | POA: Diagnosis not present

## 2022-09-01 ENCOUNTER — Ambulatory Visit (INDEPENDENT_AMBULATORY_CARE_PROVIDER_SITE_OTHER): Payer: 59 | Admitting: Podiatry

## 2022-09-01 VITALS — BP 133/89 | HR 89

## 2022-09-01 DIAGNOSIS — Z79899 Other long term (current) drug therapy: Secondary | ICD-10-CM

## 2022-09-01 DIAGNOSIS — B351 Tinea unguium: Secondary | ICD-10-CM

## 2022-09-01 NOTE — Patient Instructions (Signed)
Terbinafine Tablets What is this medication? TERBINAFINE (TER bin a feen) treats fungal infections of the nails. It belongs to a group of medications called antifungals. It will not treat infections caused by bacteria or viruses. This medicine may be used for other purposes; ask your health care provider or pharmacist if you have questions. COMMON BRAND NAME(S): Lamisil, Terbinex What should I tell my care team before I take this medication? They need to know if you have any of these conditions: Liver disease An unusual or allergic reaction to terbinafine, other medications, foods, dyes, or preservatives Pregnant or trying to get pregnant Breast-feeding How should I use this medication? Take this medication by mouth with water. Take it as directed on the prescription label at the same time every day. You can take it with or without food. If it upsets your stomach, take it with food. Keep taking it unless your care team tells you to stop. A special MedGuide will be given to you by the pharmacist with each prescription and refill. Be sure to read this information carefully each time. Talk to your care team regarding the use of this medication in children. Special care may be needed. Overdosage: If you think you have taken too much of this medicine contact a poison control center or emergency room at once. NOTE: This medicine is only for you. Do not share this medicine with others. What if I miss a dose? If you miss a dose, take it as soon as you can unless it is more than 4 hours late. If it is more than 4 hours late, skip the missed dose. Take the next dose at the normal time. What may interact with this medication? Do not take this medication with any of the following: Pimozide Thioridazine This medication may also interact with the following: Beta blockers Caffeine Certain medications for mental health conditions Cimetidine Cyclosporine Medications for fungal infections like fluconazole  and ketoconazole Medications for irregular heartbeat like amiodarone, flecainide and propafenone Rifampin Warfarin This list may not describe all possible interactions. Give your health care provider a list of all the medicines, herbs, non-prescription drugs, or dietary supplements you use. Also tell them if you smoke, drink alcohol, or use illegal drugs. Some items may interact with your medicine. What should I watch for while using this medication? Visit your care team for regular checks on your progress. You may need blood work while you are taking this medication. It may be some time before you see the benefit from this medication. This medication may cause serious skin reactions. They can happen weeks to months after starting the medication. Contact your care team right away if you notice fevers or flu-like symptoms with a rash. The rash may be red or purple and then turn into blisters or peeling of the skin. Or, you might notice a red rash with swelling of the face, lips or lymph nodes in your neck or under your arms. This medication can make you more sensitive to the sun. Keep out of the sun, If you cannot avoid being in the sun, wear protective clothing and sunscreen. Do not use sun lamps or tanning beds/booths. What side effects may I notice from receiving this medication? Side effects that you should report to your care team as soon as possible: Allergic reactions--skin rash, itching, hives, swelling of the face, lips, tongue, or throat Change in sense of smell Change in taste Infection--fever, chills, cough, or sore throat Liver injury--right upper belly pain, loss of appetite, nausea,  light-colored stool, dark yellow or brown urine, yellowing skin or eyes, unusual weakness or fatigue Low red blood cell level--unusual weakness or fatigue, dizziness, headache, trouble breathing Lupus-like syndrome--joint pain, swelling, or stiffness, butterfly-shaped rash on the face, rashes that get worse  in the sun, fever, unusual weakness or fatigue Rash, fever, and swollen lymph nodes Redness, blistering, peeling, or loosening of the skin, including inside the mouth Unusual bruising or bleeding Worsening mood, feelings of depression Side effects that usually do not require medical attention (report to your care team if they continue or are bothersome): Diarrhea Gas Headache Nausea Stomach pain Upset stomach This list may not describe all possible side effects. Call your doctor for medical advice about side effects. You may report side effects to FDA at 1-800-FDA-1088. Where should I keep my medication? Keep out of the reach of children and pets. Store between 20 and 25 degrees C (68 and 77 degrees F). Protect from light. Get rid of any unused medication after the expiration date. To get rid of medications that are no longer needed or have expired: Take the medication to a medication take-back program. Check with your pharmacy or law enforcement to find a location. If you cannot return the medication, check the label or package insert to see if the medication should be thrown out in the garbage or flushed down the toilet. If you are not sure, ask your care team. If it is safe to put it in the trash, take the medication out of the container. Mix the medication with cat litter, dirt, coffee grounds, or other unwanted substance. Seal the mixture in a bag or container. Put it in the trash. NOTE: This sheet is a summary. It may not cover all possible information. If you have questions about this medicine, talk to your doctor, pharmacist, or health care provider.  2023 Elsevier/Gold Standard (2021-02-05 00:00:00)

## 2022-09-01 NOTE — Progress Notes (Unsigned)
Subjective: Chief Complaint  Patient presents with   Diabetes    Diabetic foot care   Nail Problem    Thick painful toenails, 3 month follow up    He has not been taking the medication regularly but he is doing the topical daily.   Denies any systemic complaints such as fevers, chills, nausea, vomiting. No acute changes since last appointment, and no other complaints at this time.   Objective: AAO x3, NAD DP/PT pulses palpable bilaterally, CRT less than 3 seconds Protective sensation intact with Simms Weinstein monofilament, vibratory sensation intact, Achilles tendon reflex intact No areas of pinpoint bony tenderness or pain with vibratory sensation. MMT 5/5, ROM WNL. No edema, erythema, increase in warmth to bilateral lower extremities.  No open lesions or pre-ulcerative lesions.  No pain with calf compression, swelling, warmth, erythema  Assessment:  Plan: -All treatment options discussed with the patient including all alternatives, risks, complications.  -reheck blood work, pulse dose Lamisil  -Patient encouraged to call the office with any questions, concerns, change in symptoms.

## 2022-09-02 LAB — CBC WITH DIFFERENTIAL/PLATELET
Basophils Absolute: 0 10*3/uL (ref 0.0–0.2)
Basos: 1 %
EOS (ABSOLUTE): 0.2 10*3/uL (ref 0.0–0.4)
Eos: 2 %
Hematocrit: 44.9 % (ref 37.5–51.0)
Hemoglobin: 14.9 g/dL (ref 13.0–17.7)
Immature Grans (Abs): 0 10*3/uL (ref 0.0–0.1)
Immature Granulocytes: 0 %
Lymphocytes Absolute: 2.4 10*3/uL (ref 0.7–3.1)
Lymphs: 35 %
MCH: 28.5 pg (ref 26.6–33.0)
MCHC: 33.2 g/dL (ref 31.5–35.7)
MCV: 86 fL (ref 79–97)
Monocytes Absolute: 0.5 10*3/uL (ref 0.1–0.9)
Monocytes: 7 %
Neutrophils Absolute: 3.8 10*3/uL (ref 1.4–7.0)
Neutrophils: 55 %
Platelets: 236 10*3/uL (ref 150–450)
RBC: 5.22 x10E6/uL (ref 4.14–5.80)
RDW: 13.1 % (ref 11.6–15.4)
WBC: 6.9 10*3/uL (ref 3.4–10.8)

## 2022-09-02 LAB — HEPATIC FUNCTION PANEL
ALT: 24 IU/L (ref 0–44)
AST: 17 IU/L (ref 0–40)
Albumin: 4.4 g/dL (ref 4.1–5.1)
Alkaline Phosphatase: 94 IU/L (ref 44–121)
Bilirubin Total: 0.2 mg/dL (ref 0.0–1.2)
Bilirubin, Direct: <0.1 mg/dL (ref 0.00–0.40)
Total Protein: 6.7 g/dL (ref 6.0–8.5)

## 2022-09-03 DIAGNOSIS — B351 Tinea unguium: Secondary | ICD-10-CM | POA: Insufficient documentation

## 2022-09-05 ENCOUNTER — Other Ambulatory Visit (HOSPITAL_COMMUNITY): Payer: Self-pay

## 2022-09-07 ENCOUNTER — Other Ambulatory Visit: Payer: Self-pay | Admitting: Podiatry

## 2022-09-07 ENCOUNTER — Other Ambulatory Visit (HOSPITAL_COMMUNITY): Payer: Self-pay

## 2022-09-07 MED ORDER — CLONAZEPAM 1 MG PO TABS
1.0000 mg | ORAL_TABLET | Freq: Every day | ORAL | 1 refills | Status: DC
  Filled 2022-09-07: qty 30, 30d supply, fill #0
  Filled 2022-10-04 – 2022-10-06 (×2): qty 30, 30d supply, fill #1
  Filled 2022-12-06: qty 30, 30d supply, fill #2

## 2022-09-07 MED ORDER — TERBINAFINE HCL 250 MG PO TABS
ORAL_TABLET | ORAL | 0 refills | Status: DC
  Filled 2022-09-07: qty 14, 30d supply, fill #0

## 2022-09-08 ENCOUNTER — Other Ambulatory Visit: Payer: Self-pay

## 2022-09-08 ENCOUNTER — Other Ambulatory Visit (HOSPITAL_COMMUNITY): Payer: Self-pay

## 2022-09-09 ENCOUNTER — Other Ambulatory Visit (HOSPITAL_COMMUNITY): Payer: Self-pay

## 2022-09-11 ENCOUNTER — Other Ambulatory Visit (HOSPITAL_COMMUNITY): Payer: Self-pay

## 2022-09-30 ENCOUNTER — Other Ambulatory Visit (HOSPITAL_COMMUNITY): Payer: Self-pay

## 2022-10-01 ENCOUNTER — Other Ambulatory Visit (HOSPITAL_COMMUNITY): Payer: Self-pay

## 2022-10-04 ENCOUNTER — Other Ambulatory Visit (HOSPITAL_COMMUNITY): Payer: Self-pay

## 2022-10-06 ENCOUNTER — Other Ambulatory Visit: Payer: Self-pay

## 2022-10-24 ENCOUNTER — Other Ambulatory Visit (HOSPITAL_COMMUNITY): Payer: Self-pay

## 2022-11-11 ENCOUNTER — Other Ambulatory Visit (HOSPITAL_COMMUNITY): Payer: Self-pay

## 2022-12-04 ENCOUNTER — Ambulatory Visit: Payer: 59 | Admitting: Podiatry

## 2022-12-06 ENCOUNTER — Other Ambulatory Visit (HOSPITAL_COMMUNITY): Payer: Self-pay

## 2022-12-08 ENCOUNTER — Other Ambulatory Visit (HOSPITAL_COMMUNITY): Payer: Self-pay

## 2022-12-08 MED ORDER — JARDIANCE 25 MG PO TABS
25.0000 mg | ORAL_TABLET | Freq: Every day | ORAL | 4 refills | Status: DC
  Filled 2022-12-08: qty 30, 30d supply, fill #0
  Filled 2023-04-11: qty 30, 30d supply, fill #1

## 2022-12-12 ENCOUNTER — Other Ambulatory Visit (HOSPITAL_COMMUNITY): Payer: Self-pay

## 2022-12-12 MED ORDER — MONTELUKAST SODIUM 10 MG PO TABS
10.0000 mg | ORAL_TABLET | Freq: Every day | ORAL | 3 refills | Status: AC
  Filled 2022-12-12: qty 30, 30d supply, fill #0
  Filled 2023-06-09: qty 30, 30d supply, fill #1

## 2022-12-12 MED ORDER — JARDIANCE 25 MG PO TABS
25.0000 mg | ORAL_TABLET | Freq: Every day | ORAL | 4 refills | Status: DC
  Filled 2022-12-12: qty 90, 90d supply, fill #0
  Filled 2023-03-19: qty 30, 30d supply, fill #0
  Filled 2023-04-10 – 2023-06-09 (×2): qty 30, 30d supply, fill #1
  Filled 2023-08-28 (×2): qty 30, 30d supply, fill #2
  Filled 2023-11-10: qty 30, 30d supply, fill #3

## 2022-12-12 MED ORDER — MOUNJARO 10 MG/0.5ML ~~LOC~~ SOAJ
SUBCUTANEOUS | 3 refills | Status: DC
  Filled 2022-12-12: qty 2, 28d supply, fill #0
  Filled 2023-03-19: qty 2, 28d supply, fill #1
  Filled 2023-05-19: qty 2, 28d supply, fill #2
  Filled 2023-06-09: qty 2, 28d supply, fill #3

## 2022-12-16 ENCOUNTER — Other Ambulatory Visit (HOSPITAL_COMMUNITY): Payer: Self-pay

## 2022-12-17 ENCOUNTER — Other Ambulatory Visit (HOSPITAL_COMMUNITY): Payer: Self-pay

## 2022-12-18 ENCOUNTER — Other Ambulatory Visit: Payer: Self-pay

## 2022-12-18 ENCOUNTER — Other Ambulatory Visit (HOSPITAL_COMMUNITY): Payer: Self-pay

## 2022-12-18 ENCOUNTER — Ambulatory Visit: Payer: 59 | Admitting: Podiatry

## 2022-12-18 MED ORDER — AZELASTINE-FLUTICASONE 137-50 MCG/ACT NA SUSP
1.0000 | Freq: Two times a day (BID) | NASAL | 1 refills | Status: DC
  Filled 2022-12-18: qty 23, 30d supply, fill #0

## 2022-12-20 ENCOUNTER — Other Ambulatory Visit (HOSPITAL_COMMUNITY): Payer: Self-pay

## 2023-01-07 ENCOUNTER — Other Ambulatory Visit (HOSPITAL_COMMUNITY): Payer: Self-pay

## 2023-03-03 ENCOUNTER — Telehealth: Payer: 59 | Admitting: Family Medicine

## 2023-03-03 DIAGNOSIS — B379 Candidiasis, unspecified: Secondary | ICD-10-CM

## 2023-03-03 NOTE — Progress Notes (Signed)
Advised to convert to [facility] [place]

## 2023-03-19 ENCOUNTER — Other Ambulatory Visit (HOSPITAL_COMMUNITY): Payer: Self-pay

## 2023-03-19 MED ORDER — CLONAZEPAM 1 MG PO TABS
1.0000 mg | ORAL_TABLET | Freq: Every day | ORAL | 1 refills | Status: DC
  Filled 2023-03-19: qty 30, 30d supply, fill #0
  Filled 2023-04-10 – 2023-04-17 (×2): qty 30, 30d supply, fill #1
  Filled 2023-05-21: qty 30, 30d supply, fill #2
  Filled 2023-08-28: qty 30, 30d supply, fill #3

## 2023-03-20 ENCOUNTER — Other Ambulatory Visit (HOSPITAL_COMMUNITY): Payer: Self-pay

## 2023-04-10 ENCOUNTER — Other Ambulatory Visit (HOSPITAL_COMMUNITY): Payer: Self-pay

## 2023-04-10 MED ORDER — LOSARTAN POTASSIUM 100 MG PO TABS
100.0000 mg | ORAL_TABLET | Freq: Every day | ORAL | 3 refills | Status: DC
  Filled 2023-04-10: qty 30, 30d supply, fill #0
  Filled 2023-06-09: qty 30, 30d supply, fill #1
  Filled 2023-11-10: qty 30, 30d supply, fill #2

## 2023-04-11 ENCOUNTER — Other Ambulatory Visit (HOSPITAL_COMMUNITY): Payer: Self-pay

## 2023-04-12 ENCOUNTER — Other Ambulatory Visit (HOSPITAL_COMMUNITY): Payer: Self-pay

## 2023-04-12 MED ORDER — PREGABALIN 75 MG PO CAPS
75.0000 mg | ORAL_CAPSULE | Freq: Two times a day (BID) | ORAL | 3 refills | Status: DC
  Filled 2023-04-12: qty 60, 30d supply, fill #0
  Filled 2023-06-09: qty 60, 30d supply, fill #1
  Filled 2023-08-28: qty 60, 30d supply, fill #2
  Filled 2023-11-10: qty 60, 30d supply, fill #3

## 2023-04-13 ENCOUNTER — Other Ambulatory Visit (HOSPITAL_COMMUNITY): Payer: Self-pay

## 2023-04-14 ENCOUNTER — Other Ambulatory Visit (HOSPITAL_COMMUNITY): Payer: Self-pay

## 2023-04-14 MED ORDER — EZETIMIBE 10 MG PO TABS
10.0000 mg | ORAL_TABLET | Freq: Every day | ORAL | 3 refills | Status: DC
Start: 2023-04-14 — End: 2023-12-08
  Filled 2023-04-14: qty 30, 30d supply, fill #0
  Filled 2023-06-09: qty 30, 30d supply, fill #1

## 2023-04-14 MED ORDER — FLUCONAZOLE 150 MG PO TABS
ORAL_TABLET | ORAL | 1 refills | Status: DC
  Filled 2023-04-14: qty 3, 10d supply, fill #0

## 2023-04-14 MED ORDER — AMLODIPINE BESYLATE 2.5 MG PO TABS
2.5000 mg | ORAL_TABLET | Freq: Every day | ORAL | 3 refills | Status: DC
Start: 2023-04-14 — End: 2023-12-08
  Filled 2023-04-14: qty 30, 30d supply, fill #0
  Filled 2023-06-09: qty 30, 30d supply, fill #1

## 2023-04-15 ENCOUNTER — Other Ambulatory Visit (HOSPITAL_COMMUNITY): Payer: Self-pay

## 2023-04-16 ENCOUNTER — Other Ambulatory Visit (HOSPITAL_COMMUNITY): Payer: Self-pay

## 2023-04-17 ENCOUNTER — Other Ambulatory Visit: Payer: Self-pay

## 2023-05-05 ENCOUNTER — Other Ambulatory Visit: Payer: Self-pay | Admitting: Thoracic Surgery (Cardiothoracic Vascular Surgery)

## 2023-05-05 DIAGNOSIS — I7121 Aneurysm of the ascending aorta, without rupture: Secondary | ICD-10-CM

## 2023-05-07 ENCOUNTER — Other Ambulatory Visit (HOSPITAL_COMMUNITY): Payer: Self-pay

## 2023-05-16 ENCOUNTER — Telehealth: Payer: 59 | Admitting: Physician Assistant

## 2023-05-16 DIAGNOSIS — U071 COVID-19: Secondary | ICD-10-CM | POA: Diagnosis not present

## 2023-05-16 MED ORDER — MOLNUPIRAVIR EUA 200MG CAPSULE
4.0000 | ORAL_CAPSULE | Freq: Two times a day (BID) | ORAL | 0 refills | Status: AC
Start: 2023-05-16 — End: 2023-05-21

## 2023-05-16 MED ORDER — BENZONATATE 100 MG PO CAPS
100.0000 mg | ORAL_CAPSULE | Freq: Three times a day (TID) | ORAL | 0 refills | Status: DC | PRN
Start: 2023-05-16 — End: 2023-06-18

## 2023-05-16 NOTE — Progress Notes (Signed)
Virtual Visit Consent   Stephen Dunlap, you are scheduled for a virtual visit with a [facility] provider today. Just as with appointments in the office, your consent must be obtained to participate. Your consent will be active for this visit and any virtual visit you may have with one of our providers in the next 365 days. If you have a MyChart account, a copy of this consent can be sent to you electronically.  As this is a virtual visit, video technology does not allow for your provider to perform a traditional examination. This may limit your provider's ability to fully assess your condition. If your provider identifies any concerns that need to be evaluated in person or the need to arrange testing (such as labs, EKG, etc.), we will make arrangements to do so. Although advances in technology are sophisticated, we cannot ensure that it will always work on either your end or our end. If the connection with a video visit is poor, the visit may have to be switched to a telephone visit. With either a video or telephone visit, we are not always able to ensure that we have a secure connection.  By engaging in this virtual visit, you consent to the provision of healthcare and authorize for your insurance to be billed (if applicable) for the services provided during this visit. Depending on your insurance coverage, you may receive a charge related to this service.  I need to obtain your verbal consent now. Are you willing to proceed with your visit today? Stephen Dunlap has provided verbal consent on 05/16/2023 for a virtual visit (video or telephone). Stephen Dunlap, [place]  Date: 05/16/2023 7:06 PM  Virtual Visit via Video Note   I, Stephen Dunlap, connected with  Stephen Dunlap  ([id number], 09-14-1970) on 05/16/23 at  7:30 PM EDT by a video-enabled telemedicine application and verified that I am speaking with the correct person using two identifiers.  Location: Patient: Virtual Visit Location  Patient: Home Provider: Virtual Visit Location Provider: Home Office   I discussed the limitations of evaluation and management by telemedicine and the availability of in person appointments. The patient expressed understanding and agreed to proceed.    History of Present Illness: Stephen Dunlap is a 52 y.o. who identifies as a male who was assigned male at birth, and is being seen today for cough and congestion, headache and nasal congestion starting today with a low-grade fever. Wife was diagnosed with COVID-19. As such took a home COVID test which was positive. Denies chest pain or SOB.  Some nausea. OTC -- Nothing so far.    HPI: HPI  Problems:  Patient Active Problem List   Diagnosis Date Noted   Onychomycosis 09/03/2022   Cervical radiculopathy 02/08/2018   TIA (transient ischemic attack) 04/29/2017   Acute bronchitis 03/02/2015   Wheezing 03/02/2015   Cough 02/26/2015   Pneumonia, organism unspecified(486) 02/26/2015   Shift work sleep disorder 03/03/2014   GERD (gastroesophageal reflux disease) 06/22/2013   Depression, acute 06/22/2013   Allergic rhinitis 12/15/2012   Routine general medical examination at a health care facility 12/02/2012   OSA (obstructive sleep apnea) 11/17/2011   Hyperlipidemia with target LDL less than 100 10/31/2010   Diabetes mellitus type 2 with complications (HCC) 10/22/2010   OBESITY, MORBID 06/10/2010   Essential hypertension 06/10/2010    Allergies:  Allergies  Allergen Reactions   Other Anaphylaxis    Tree nuts   Metformin    Invokana [Canagliflozin]  Other (See Comments)    Yeast infection   Medications:  Current Outpatient Medications:    benzonatate (TESSALON) 100 MG capsule, Take 1 capsule (100 mg total) by mouth 3 (three) times daily as needed for cough., Disp: 30 capsule, Rfl: 0   molnupiravir EUA (LAGEVRIO) 200 mg CAPS capsule, Take 4 capsules (800 mg total) by mouth 2 (two) times daily for 5 days., Disp: 40 capsule, Rfl: 0    albuterol (PROVENTIL HFA;VENTOLIN HFA) 108 (90 BASE) MCG/ACT inhaler, Inhale 2 puffs into the lungs every 6 (six) hours as needed for wheezing or shortness of breath., Disp: 1 Inhaler, Rfl: 2   amLODipine (NORVASC) 2.5 MG tablet, Take 1 tablet (2.5 mg total) by mouth daily., Disp: 90 tablet, Rfl: 3   ammonium lactate (AMLACTIN) 12 % lotion, Apply 1 Application topically as needed for dry skin., Disp: 452 g, Rfl: 0   amoxicillin-clavulanate (AUGMENTIN) 875-125 MG tablet, Take 1 tablet by mouth every 12 (twelve) hours for 7 days, Disp: 14 tablet, Rfl: 0   aspirin EC (ASPIRIN 81) 81 MG tablet, TAKE 1 TABLET BY MOUTH ONCE DAILY., Disp: 90 tablet, Rfl: 4   atorvastatin (LIPITOR) 40 MG tablet, TAKE 1 TABLET BY MOUTH ONCE DAILY, Disp: 90 tablet, Rfl: 1   Azelastine-Fluticasone (DYMISTA) 137-50 MCG/ACT SUSP, Place 1 spray into both nostrils 2 (two) times daily., Disp: 23 g, Rfl: 1   clonazePAM (KLONOPIN) 1 MG tablet, TAKE 1 TABLET BY MOUTH AT BEDTIME, Disp: 90 tablet, Rfl: 1   clonazePAM (KLONOPIN) 1 MG tablet, TAKE 1 TABLET BY MOUTH AT BEDTIME, Disp: 90 tablet, Rfl: 1   COVID-19 At Home Antigen Test (CARESTART COVID-19 HOME TEST) KIT, Use as directed, Disp: 4 each, Rfl: 0   diclofenac (VOLTAREN) 75 MG EC tablet, Take 1 tablet (75 mg total) by mouth 2 (two) times daily with meals., Disp: 60 tablet, Rfl: 1   empagliflozin (JARDIANCE) 25 MG TABS tablet, TAKE 1 TABLET BY MOUTH ONCE DAILY, Disp: 90 tablet, Rfl: 4   empagliflozin (JARDIANCE) 25 MG TABS tablet, Take 1 tablet (25 mg total) by mouth daily., Disp: 90 tablet, Rfl: 4   EPINEPHrine (AUVI-Q) 0.3 mg/0.3 mL IJ SOAJ injection, Inject 0.3 mg into the muscle once., Disp: , Rfl:    ezetimibe (ZETIA) 10 MG tablet, Take 1 tablet (10 mg total) by mouth daily., Disp: 90 tablet, Rfl: 3   ezetimibe (ZETIA) 10 MG tablet, Take 1 tablet (10 mg total) by mouth daily., Disp: 90 tablet, Rfl: 3   HYDROcodone-acetaminophen (NORCO/VICODIN) 5-325 MG tablet, Take 1-2 tablets  by mouth every 4 (four) hours as needed for moderate pain ((score 4 to 6))., Disp: 50 tablet, Rfl: 0   ibuprofen (ADVIL,MOTRIN) 800 MG tablet, Take 800 mg by mouth every 8 (eight) hours as needed for mild pain or moderate pain. , Disp: , Rfl:    JANUMET 50-1000 MG tablet, Take 1 tablet by mouth 2 (two) times daily with a meal. , Disp: , Rfl: 7   ketoconazole (NIZORAL) 2 % cream, Apply to the affected area daily as directed, Disp: 30 g, Rfl: 0   losartan (COZAAR) 100 MG tablet, Take 1 tablet (100 mg total) by mouth daily., Disp: 90 tablet, Rfl: 3   losartan (COZAAR) 50 MG tablet, Take 1 tablet (50 mg total) by mouth daily., Disp: 90 tablet, Rfl: 3   montelukast (SINGULAIR) 10 MG tablet, TAKE 1 TABLET BY MOUTH EACH NIGHT AT BEDTIME, Disp: 30 tablet, Rfl: 0   montelukast (SINGULAIR) 10 MG tablet,  Take 1 tablet (10 mg total) by mouth daily., Disp: 90 tablet, Rfl: 3   olopatadine (PATANOL) 0.1 % ophthalmic solution, INSTILL 1 DROP INTO BOTH EYES DAILY, Disp: 5 mL, Rfl: 0   omeprazole (PRILOSEC) 20 MG capsule, Take 1 capsule (20 mg total) by mouth daily., Disp: 90 capsule, Rfl: 3   pregabalin (LYRICA) 75 MG capsule, Take 1 capsule (75 mg total) by mouth 2 (two) times daily., Disp: 60 capsule, Rfl: 3   terbinafine (LAMISIL) 250 MG tablet, Take 1 tablet (250 mg total) by mouth daily., Disp: 90 tablet, Rfl: 0   tirzepatide (MOUNJARO) 10 MG/0.5ML Pen, Inject 10 mg under the skin weekly as directed., Disp: 6 mL, Rfl: 3   tirzepatide (MOUNJARO) 15 MG/0.5ML Pen, Inject 15 mg into the skin once a week., Disp: 6 mL, Rfl: 5  Observations/Objective: Patient is well-developed, well-nourished in no acute distress.  Resting comfortably at home.  Head is normocephalic, atraumatic.  No labored breathing. Speech is clear and coherent with logical content.  Patient is alert and oriented at baseline.   Assessment and Plan: 1. COVID-19 - benzonatate (TESSALON) 100 MG capsule; Take 1 capsule (100 mg total) by mouth 3  (three) times daily as needed for cough.  Dispense: 30 capsule; Refill: 0 - molnupiravir EUA (LAGEVRIO) 200 mg CAPS capsule; Take 4 capsules (800 mg total) by mouth 2 (two) times daily for 5 days.  Dispense: 40 capsule; Refill: 0  Patient with multiple risk factors for complicated course of illness. Discussed risks/benefits of antiviral medications including most common potential ADRs. Patient voiced understanding and would like to proceed with antiviral medication. They are candidate for Molnupiravir. Rx sent to pharmacy. Supportive measures, OTC medications and vitamin regimen reviewed. Tessalon per orders. Quarantine reviewed in detail. Strict ER precautions discussed with patient.    Follow Up Instructions: I discussed the assessment and treatment plan with the patient. The patient was provided an opportunity to ask questions and all were answered. The patient agreed with the plan and demonstrated an understanding of the instructions.  A copy of instructions were sent to the patient via MyChart unless otherwise noted below.   The patient was advised to call back or seek an in-person evaluation if the symptoms worsen or if the condition fails to improve as anticipated.    Stephen Climes, PA-C

## 2023-05-16 NOTE — Patient Instructions (Signed)
Kristin Bruins, thank you for joining [name], PA-C for today's virtual visit.  While this provider is not your primary care provider (PCP), if your PCP is located in our provider database this encounter information will be shared with them immediately following your visit.   A [facility] MyChart account gives you access to today's visit and all your visits, tests, and labs performed at [facility] " click here if you don't have a [facility] MyChart account or go to [email or web address]  Consent: (Patient) Stephen Dunlap provided verbal consent for this virtual visit at the beginning of the encounter.  Current Medications:  Current Outpatient Medications:    albuterol (PROVENTIL HFA;VENTOLIN HFA) 108 (90 BASE) MCG/ACT inhaler, Inhale 2 puffs into the lungs every 6 (six) hours as needed for wheezing or shortness of breath., Disp: 1 Inhaler, Rfl: 2   amLODipine (NORVASC) 2.5 MG tablet, Take 1 tablet (2.5 mg total) by mouth daily., Disp: 90 tablet, Rfl: 3   ammonium lactate (AMLACTIN) 12 % lotion, Apply 1 Application topically as needed for dry skin., Disp: 452 g, Rfl: 0   amoxicillin-clavulanate (AUGMENTIN) 875-125 MG tablet, Take 1 tablet by mouth every 12 (twelve) hours for 7 days, Disp: 14 tablet, Rfl: 0   aspirin EC (ASPIRIN 81) 81 MG tablet, TAKE 1 TABLET BY MOUTH ONCE DAILY., Disp: 90 tablet, Rfl: 4   atorvastatin (LIPITOR) 40 MG tablet, TAKE 1 TABLET BY MOUTH ONCE DAILY, Disp: 90 tablet, Rfl: 1   Azelastine-Fluticasone (DYMISTA) 137-50 MCG/ACT SUSP, Place 1 spray into both nostrils 2 (two) times daily., Disp: 23 g, Rfl: 1   clonazePAM (KLONOPIN) 1 MG tablet, TAKE 1 TABLET BY MOUTH AT BEDTIME, Disp: 90 tablet, Rfl: 1   clonazePAM (KLONOPIN) 1 MG tablet, TAKE 1 TABLET BY MOUTH AT BEDTIME, Disp: 90 tablet, Rfl: 1   COVID-19 At Home Antigen Test (CARESTART COVID-19 HOME TEST) KIT, Use as directed, Disp: 4 each, Rfl: 0   diclofenac (VOLTAREN) 75 MG EC tablet, Take 1  tablet (75 mg total) by mouth 2 (two) times daily with meals., Disp: 60 tablet, Rfl: 1   empagliflozin (JARDIANCE) 25 MG TABS tablet, TAKE 1 TABLET BY MOUTH ONCE DAILY, Disp: 90 tablet, Rfl: 4   empagliflozin (JARDIANCE) 25 MG TABS tablet, Take 1 tablet (25 mg total) by mouth daily., Disp: 90 tablet, Rfl: 4   EPINEPHrine (AUVI-Q) 0.3 mg/0.3 mL IJ SOAJ injection, Inject 0.3 mg into the muscle once., Disp: , Rfl:    ezetimibe (ZETIA) 10 MG tablet, Take 1 tablet (10 mg total) by mouth daily., Disp: 90 tablet, Rfl: 3   ezetimibe (ZETIA) 10 MG tablet, Take 1 tablet (10 mg total) by mouth daily., Disp: 90 tablet, Rfl: 3   HYDROcodone-acetaminophen (NORCO/VICODIN) 5-325 MG tablet, Take 1-2 tablets by mouth every 4 (four) hours as needed for moderate pain ((score 4 to 6))., Disp: 50 tablet, Rfl: 0   ibuprofen (ADVIL,MOTRIN) 800 MG tablet, Take 800 mg by mouth every 8 (eight) hours as needed for mild pain or moderate pain. , Disp: , Rfl:    JANUMET 50-1000 MG tablet, Take 1 tablet by mouth 2 (two) times daily with a meal. , Disp: , Rfl: 7   ketoconazole (NIZORAL) 2 % cream, Apply to the affected area daily as directed, Disp: 30 g, Rfl: 0   losartan (COZAAR) 100 MG tablet, Take 1 tablet (100 mg total) by mouth daily., Disp: 90 tablet, Rfl: 3   losartan (COZAAR) 50 MG tablet, Take 1  tablet (50 mg total) by mouth daily., Disp: 90 tablet, Rfl: 3   montelukast (SINGULAIR) 10 MG tablet, TAKE 1 TABLET BY MOUTH EACH NIGHT AT BEDTIME, Disp: 30 tablet, Rfl: 0   montelukast (SINGULAIR) 10 MG tablet, Take 1 tablet (10 mg total) by mouth daily., Disp: 90 tablet, Rfl: 3   olopatadine (PATANOL) 0.1 % ophthalmic solution, INSTILL 1 DROP INTO BOTH EYES DAILY, Disp: 5 mL, Rfl: 0   omeprazole (PRILOSEC) 20 MG capsule, Take 1 capsule (20 mg total) by mouth daily., Disp: 90 capsule, Rfl: 3   pregabalin (LYRICA) 75 MG capsule, Take 1 capsule (75 mg total) by mouth 2 (two) times daily., Disp: 60 capsule, Rfl: 3   terbinafine  (LAMISIL) 250 MG tablet, Take 1 tablet (250 mg total) by mouth daily., Disp: 90 tablet, Rfl: 0   tirzepatide (MOUNJARO) 10 MG/0.5ML Pen, Inject 10 mg under the skin weekly as directed., Disp: 6 mL, Rfl: 3   tirzepatide (MOUNJARO) 15 MG/0.5ML Pen, Inject 15 mg into the skin once a week., Disp: 6 mL, Rfl: 5   Medications ordered in this encounter:  No orders of the defined types were placed in this encounter.    *If you need refills on other medications prior to your next appointment, please contact your pharmacy*  Follow-Up: Call back or seek an in-person evaluation if the symptoms worsen or if the condition fails to improve as anticipated.  [facility] [phone]  Care Instructions: Please keep well-hydrated and get plenty of rest. Start a saline nasal rinse to flush out your nasal passages. You can use plain Mucinex to help thin congestion. If you have a humidifier, running in the bedroom at night. I want you to start OTC vitamin D3 1000 units daily, vitamin C 1000 mg daily, and a zinc supplement. Please take prescribed medications as directed.    Isolation Instructions: You are to isolate at home until you have been fever free for at least 24 hours without a fever-reducing medication, and symptoms have been steadily improving for 24 hours. At that time,  you can end isolation but need to mask for an additional 5 days.   If you must be around other household members who do not have symptoms, you need to make sure that both you and the family members are masking consistently with a high-quality mask.  If you note any worsening of symptoms despite treatment, please seek an in-person evaluation ASAP. If you note any significant shortness of breath or any chest pain, please seek ER evaluation. Please do not delay care!   COVID-19: What to Do if You Are Sick If you test positive and are an older adult or someone who is at high risk of getting very sick from COVID-19,  treatment may be available. Contact a healthcare provider right away after a positive test to determine if you are eligible, even if your symptoms are mild right now. You can also visit a Test to Treat location and, if eligible, receive a prescription from a provider. Don't delay: Treatment must be started within the first few days to be effective. If you have a fever, cough, or other symptoms, you might have COVID-19. Most people have mild illness and are able to recover at home. If you are sick: Keep track of your symptoms. If you have an emergency warning sign (including trouble breathing), call 911. Steps to help prevent the spread of COVID-19 if you are sick If you are sick with COVID-19 or think  you might have COVID-19, follow the steps below to care for yourself and to help protect other people in your home and community. Stay home except to get medical care Stay home. Most people with COVID-19 have mild illness and can recover at home without medical care. Do not leave your home, except to get medical care. Do not visit public areas and do not go to places where you are unable to wear a mask. Take care of yourself. Get rest and stay hydrated. Take over-the-counter medicines, such as acetaminophen, to help you feel better. Stay in touch with your doctor. Call before you get medical care. Be sure to get care if you have trouble breathing, or have any other emergency warning signs, or if you think it is an emergency. Avoid public transportation, ride-sharing, or taxis if possible. Get tested If you have symptoms of COVID-19, get tested. While waiting for test results, stay away from others, including staying apart from those living in your household. Get tested as soon as possible after your symptoms start. Treatments may be available for people with COVID-19 who are at risk for becoming very sick. Don't delay: Treatment must be started early to be effective--some treatments must begin within 5  days of your first symptoms. Contact your healthcare provider right away if your test result is positive to determine if you are eligible. Self-tests are one of several options for testing for the virus that causes COVID-19 and may be more convenient than laboratory-based tests and point-of-care tests. Ask your healthcare provider or your local health department if you need help interpreting your test results. You can visit your state, tribal, local, and territorial health department's website to look for the latest local information on testing sites. Separate yourself from other people As much as possible, stay in a specific room and away from other people and pets in your home. If possible, you should use a separate bathroom. If you need to be around other people or animals in or outside of the home, wear a well-fitting mask. Tell your close contacts that they may have been exposed to COVID-19. An infected person can spread COVID-19 starting 48 hours (or 2 days) before the person has any symptoms or tests positive. By letting your close contacts know they may have been exposed to COVID-19, you are helping to protect everyone. See COVID-19 and Animals if you have questions about pets. If you are diagnosed with COVID-19, someone from the health department may call you. Answer the call to slow the spread. Monitor your symptoms Symptoms of COVID-19 include fever, cough, or other symptoms. Follow care instructions from your healthcare provider and local health department. Your local health authorities may give instructions on checking your symptoms and reporting information. When to seek emergency medical attention Look for emergency warning signs* for COVID-19. If someone is showing any of these signs, seek emergency medical care immediately: Trouble breathing Persistent pain or pressure in the chest New confusion Inability to wake or stay awake Pale, gray, or blue-colored skin, lips, or nail beds,  depending on skin tone *This list is not all possible symptoms. Please call your medical provider for any other symptoms that are severe or concerning to you. Call 911 or call ahead to your local emergency facility: Notify the operator that you are seeking care for someone who has or may have COVID-19. Call ahead before visiting your doctor Call ahead. Many medical visits for routine care are being postponed or done by phone or telemedicine. If  you have a medical appointment that cannot be postponed, call your doctor's office, and tell them you have or may have COVID-19. This will help the office protect themselves and other patients. If you are sick, wear a well-fitting mask You should wear a mask if you must be around other people or animals, including pets (even at home). Wear a mask with the best fit, protection, and comfort for you. You don't need to wear the mask if you are alone. If you can't put on a mask (because of trouble breathing, for example), cover your coughs and sneezes in some other way. Try to stay at least 6 feet away from other people. This will help protect the people around you. Masks should not be placed on young children under age 81 years, anyone who has trouble breathing, or anyone who is not able to remove the mask without help. Cover your coughs and sneezes Cover your mouth and nose with a tissue when you cough or sneeze. Throw away used tissues in a lined trash can. Immediately wash your hands with soap and water for at least 20 seconds. If soap and water are not available, clean your hands with an alcohol-based hand sanitizer that contains at least 60% alcohol. Clean your hands often Wash your hands often with soap and water for at least 20 seconds. This is especially important after blowing your nose, coughing, or sneezing; going to the bathroom; and before eating or preparing food. Use hand sanitizer if soap and water are not available. Use an alcohol-based hand  sanitizer with at least 60% alcohol, covering all surfaces of your hands and rubbing them together until they feel dry. Soap and water are the best option, especially if hands are visibly dirty. Avoid touching your eyes, nose, and mouth with unwashed hands. Handwashing Tips Avoid sharing personal household items Do not share dishes, drinking glasses, cups, eating utensils, towels, or bedding with other people in your home. Wash these items thoroughly after using them with soap and water or put in the dishwasher. Clean surfaces in your home regularly Clean and disinfect high-touch surfaces (for example, doorknobs, tables, handles, light switches, and countertops) in your "sick room" and bathroom. In shared spaces, you should clean and disinfect surfaces and items after each use by the person who is ill. If you are sick and cannot clean, a caregiver or other person should only clean and disinfect the area around you (such as your bedroom and bathroom) on an as needed basis. Your caregiver/other person should wait as long as possible (at least several hours) and wear a mask before entering, cleaning, and disinfecting shared spaces that you use. Clean and disinfect areas that may have blood, stool, or body fluids on them. Use household cleaners and disinfectants. Clean visible dirty surfaces with household cleaners containing soap or detergent. Then, use a household disinfectant. Use a product from [employer] List N: Disinfectants for Coronavirus (COVID-19). Be sure to follow the instructions on the label to ensure safe and effective use of the product. Many products recommend keeping the surface wet with a disinfectant for a certain period of time (look at "contact time" on the product label). You may also need to wear personal protective equipment, such as gloves, depending on the directions on the product label. Immediately after disinfecting, wash your hands with soap and water for 20 seconds. For  completed guidance on cleaning and disinfecting your home, visit Complete Disinfection Guidance. Take steps to improve ventilation at home Improve ventilation (air  flow) at home to help prevent from spreading COVID-19 to other people in your household. Clear out COVID-19 virus particles in the air by opening windows, using air filters, and turning on fans in your home. Use this interactive tool to learn how to improve air flow in your home. When you can be around others after being sick with COVID-19 Deciding when you can be around others is different for different situations. Find out when you can safely end home isolation. For any additional questions about your care, contact your healthcare provider or state or local health department. 10/16/2020 Content source: [facility] ([facility]), Division of Viral Diseases This information is not intended to replace advice given to you by your health care provider. Make sure you discuss any questions you have with your health care provider. Document Revised: 11/29/2020 Document Reviewed: 11/29/2020 Elsevier Patient Education  2022 [employer].  If you have been instructed to have an in-person evaluation today at a local Urgent Care facility, please use the link below. It will take you to a list of all of our available [facility], including address, phone number and hours of operation. Please do not delay care.  [facility]  If you or a family member do not have a primary care provider, use the link below to schedule a visit and establish care. When you choose a [facility] primary care physician or advanced practice provider, you gain a long-term partner in health. Find a Primary Care Provider  Learn more about [facility]'s in-office and virtual care options:  - Get Care Now

## 2023-05-19 ENCOUNTER — Other Ambulatory Visit (HOSPITAL_COMMUNITY): Payer: Self-pay

## 2023-05-20 ENCOUNTER — Other Ambulatory Visit (HOSPITAL_COMMUNITY): Payer: Self-pay

## 2023-05-20 MED ORDER — CIPROFLOXACIN HCL 500 MG PO TABS
500.0000 mg | ORAL_TABLET | Freq: Two times a day (BID) | ORAL | 0 refills | Status: DC
  Filled 2023-05-20: qty 14, 7d supply, fill #0

## 2023-05-21 ENCOUNTER — Other Ambulatory Visit (HOSPITAL_COMMUNITY): Payer: Self-pay

## 2023-06-02 ENCOUNTER — Inpatient Hospital Stay: Admission: RE | Admit: 2023-06-02 | Payer: 59 | Source: Ambulatory Visit

## 2023-06-03 ENCOUNTER — Inpatient Hospital Stay
Admission: RE | Admit: 2023-06-03 | Discharge: 2023-06-03 | Discharge status: Home / Self Care | Payer: 59 | Source: Ambulatory Visit | Attending: Thoracic Surgery (Cardiothoracic Vascular Surgery) | Admitting: Thoracic Surgery (Cardiothoracic Vascular Surgery)

## 2023-06-03 DIAGNOSIS — I7121 Aneurysm of the ascending aorta, without rupture: Secondary | ICD-10-CM

## 2023-06-03 MED ORDER — IOPAMIDOL (ISOVUE-370) INJECTION 76%
75.0000 mL | Freq: Once | INTRAVENOUS | Status: AC | PRN
  Administered 2023-06-03: 75 mL via INTRAVENOUS

## 2023-06-09 ENCOUNTER — Other Ambulatory Visit (HOSPITAL_COMMUNITY): Payer: Self-pay

## 2023-06-10 ENCOUNTER — Other Ambulatory Visit (HOSPITAL_COMMUNITY): Payer: Self-pay

## 2023-06-10 MED ORDER — MOUNJARO 15 MG/0.5ML ~~LOC~~ SOAJ
15.0000 mg | SUBCUTANEOUS | 0 refills | Status: DC
  Filled 2023-08-03: qty 2, 28d supply, fill #0
  Filled 2023-08-28 – 2023-09-24 (×2): qty 2, 28d supply, fill #1
  Filled 2023-11-10: qty 2, 28d supply, fill #2

## 2023-06-11 ENCOUNTER — Ambulatory Visit: Payer: 59

## 2023-06-15 NOTE — Progress Notes (Unsigned)
HPI:  [name] is a 52 year old gentleman who was incidentally noted to have a thoracic aortic aneurysm measuring 4.1 cm while being treated for COVID in 2020.  His past medical history is notable for a TIA, type 1 diabetes mellitus, obstructive sleep apnea, and obesity.  He was last seen 1 year ago and at that time his time his thoracic aortic dilation was stable at around 4.1 cm.  The CT was repeated 2 weeks ago and again shows stable 4.1 cm thoracic aortic aneurysm.   Current Outpatient Medications  Medication Sig Dispense Refill   albuterol (PROVENTIL HFA;VENTOLIN HFA) 108 (90 BASE) MCG/ACT inhaler Inhale 2 puffs into the lungs every 6 (six) hours as needed for wheezing or shortness of breath. 1 Inhaler 2   amLODipine (NORVASC) 2.5 MG tablet Take 1 tablet (2.5 mg total) by mouth daily. 90 tablet 3   ammonium lactate (AMLACTIN) 12 % lotion Apply 1 Application topically as needed for dry skin. 452 g 0   amoxicillin-clavulanate (AUGMENTIN) 875-125 MG tablet Take 1 tablet by mouth every 12 (twelve) hours for 7 days 14 tablet 0   aspirin EC (ASPIRIN 81) 81 MG tablet TAKE 1 TABLET BY MOUTH ONCE DAILY. 90 tablet 4   atorvastatin (LIPITOR) 40 MG tablet TAKE 1 TABLET BY MOUTH ONCE DAILY 90 tablet 1   Azelastine-Fluticasone (DYMISTA) 137-50 MCG/ACT SUSP Place 1 spray into both nostrils 2 (two) times daily. 23 g 1   benzonatate (TESSALON) 100 MG capsule Take 1 capsule (100 mg total) by mouth 3 (three) times daily as needed for cough. 30 capsule 0   ciprofloxacin (CIPRO) 500 MG tablet Take 1 tablet (500 mg total) by mouth every 12 (twelve) hours 14 tablet 0   clonazePAM (KLONOPIN) 1 MG tablet TAKE 1 TABLET BY MOUTH AT BEDTIME 90 tablet 1   clonazePAM (KLONOPIN) 1 MG tablet TAKE 1 TABLET BY MOUTH AT BEDTIME 90 tablet 1   COVID-19 At Home Antigen Test (CARESTART COVID-19 HOME TEST) KIT Use as directed 4 each 0   diclofenac (VOLTAREN) 75 MG EC tablet Take 1 tablet (75 mg total) by mouth 2 (two)  times daily with meals. 60 tablet 1   empagliflozin (JARDIANCE) 25 MG TABS tablet TAKE 1 TABLET BY MOUTH ONCE DAILY 90 tablet 4   empagliflozin (JARDIANCE) 25 MG TABS tablet Take 1 tablet (25 mg total) by mouth daily. 90 tablet 4   EPINEPHrine (AUVI-Q) 0.3 mg/0.3 mL IJ SOAJ injection Inject 0.3 mg into the muscle once.     ezetimibe (ZETIA) 10 MG tablet Take 1 tablet (10 mg total) by mouth daily. 90 tablet 3   ezetimibe (ZETIA) 10 MG tablet Take 1 tablet (10 mg total) by mouth daily. 90 tablet 3   HYDROcodone-acetaminophen (NORCO/VICODIN) 5-325 MG tablet Take 1-2 tablets by mouth every 4 (four) hours as needed for moderate pain ((score 4 to 6)). 50 tablet 0   ibuprofen (ADVIL,MOTRIN) 800 MG tablet Take 800 mg by mouth every 8 (eight) hours as needed for mild pain or moderate pain.      JANUMET 50-1000 MG tablet Take 1 tablet by mouth 2 (two) times daily with a meal.   7   ketoconazole (NIZORAL) 2 % cream Apply to the affected area daily as directed 30 g 0   losartan (COZAAR) 100 MG tablet Take 1 tablet (100 mg total) by mouth daily. 90 tablet 3   losartan (COZAAR) 50 MG tablet Take 1 tablet (50 mg total) by mouth daily. 90 tablet  3   montelukast (SINGULAIR) 10 MG tablet TAKE 1 TABLET BY MOUTH EACH NIGHT AT BEDTIME 30 tablet 0   montelukast (SINGULAIR) 10 MG tablet Take 1 tablet (10 mg total) by mouth daily. 90 tablet 3   olopatadine (PATANOL) 0.1 % ophthalmic solution INSTILL 1 DROP INTO BOTH EYES DAILY 5 mL 0   omeprazole (PRILOSEC) 20 MG capsule Take 1 capsule (20 mg total) by mouth daily. 90 capsule 3   pregabalin (LYRICA) 75 MG capsule Take 1 capsule (75 mg total) by mouth 2 (two) times daily. 60 capsule 3   terbinafine (LAMISIL) 250 MG tablet Take 1 tablet (250 mg total) by mouth daily. 90 tablet 0   tirzepatide (MOUNJARO) 10 MG/0.5ML Pen Inject 10 mg under the skin weekly as directed. 6 mL 3   tirzepatide (MOUNJARO) 15 MG/0.5ML Pen Inject 15 mg into the skin once a week. 2 mL 0   No  current facility-administered medications for this visit.    Physical Exam  Diagnostic Tests:   Impression / Plan: Stable 4.1 cm thoracic aortic aneurysm.  Encouraged him to continue his efforts for weight loss.  We reviewed the recommendations for minimizing risks of future dilation and dissection including careful management of blood pressure and control of dyslipidemia.  He should continue to avoid repetitive strenuous activities as previously discussed. Follow-up in 1 year with CTA chest  Stephen Roca, PA-C [facility] [phone]

## 2023-06-15 NOTE — Progress Notes (Unsigned)
      [address]       [place] [address]             [phone]       

## 2023-06-16 ENCOUNTER — Other Ambulatory Visit (HOSPITAL_COMMUNITY): Payer: Self-pay

## 2023-06-18 ENCOUNTER — Ambulatory Visit: Payer: 59 | Admitting: Physician Assistant

## 2023-06-18 VITALS — BP 155/97 | HR 91 | Resp 20 | Ht 77.0 in | Wt 372.0 lb

## 2023-06-18 DIAGNOSIS — I7121 Aneurysm of the ascending aorta, without rupture: Secondary | ICD-10-CM | POA: Diagnosis not present

## 2023-06-18 NOTE — Patient Instructions (Signed)
Maintain a close watch and good control of your blood pressure with goal less than 130/90.  Avoid repetitive strenuous activities  Avoid the fluoroquinolone class of antibiotics like Cipro or Levaquin as these have been shown to weaken connective tissue and increase the risk for expansion of the aneurysm.  Follow-up in 1 year with CTA chest.

## 2023-07-01 ENCOUNTER — Other Ambulatory Visit (HOSPITAL_COMMUNITY): Payer: Self-pay

## 2023-07-16 ENCOUNTER — Other Ambulatory Visit (HOSPITAL_COMMUNITY): Payer: Self-pay

## 2023-07-16 DIAGNOSIS — I700 Atherosclerosis of aorta: Secondary | ICD-10-CM | POA: Diagnosis not present

## 2023-07-16 DIAGNOSIS — G4733 Obstructive sleep apnea (adult) (pediatric): Secondary | ICD-10-CM | POA: Diagnosis not present

## 2023-07-16 DIAGNOSIS — E785 Hyperlipidemia, unspecified: Secondary | ICD-10-CM | POA: Diagnosis not present

## 2023-07-16 DIAGNOSIS — K760 Fatty (change of) liver, not elsewhere classified: Secondary | ICD-10-CM | POA: Diagnosis not present

## 2023-07-16 DIAGNOSIS — F331 Major depressive disorder, recurrent, moderate: Secondary | ICD-10-CM | POA: Diagnosis not present

## 2023-07-16 DIAGNOSIS — I5189 Other ill-defined heart diseases: Secondary | ICD-10-CM | POA: Diagnosis not present

## 2023-07-16 DIAGNOSIS — E1149 Type 2 diabetes mellitus with other diabetic neurological complication: Secondary | ICD-10-CM | POA: Diagnosis not present

## 2023-07-16 DIAGNOSIS — I7121 Aneurysm of the ascending aorta, without rupture: Secondary | ICD-10-CM | POA: Diagnosis not present

## 2023-07-16 DIAGNOSIS — I10 Essential (primary) hypertension: Secondary | ICD-10-CM | POA: Diagnosis not present

## 2023-07-16 MED ORDER — KETOCONAZOLE 2 % EX CREA
TOPICAL_CREAM | CUTANEOUS | 3 refills | Status: DC
  Filled 2023-07-16: qty 30, 14d supply, fill #0

## 2023-07-17 ENCOUNTER — Other Ambulatory Visit (HOSPITAL_COMMUNITY): Payer: Self-pay

## 2023-08-03 ENCOUNTER — Other Ambulatory Visit (HOSPITAL_COMMUNITY): Payer: Self-pay

## 2023-08-13 ENCOUNTER — Other Ambulatory Visit (HOSPITAL_COMMUNITY): Payer: Self-pay

## 2023-08-13 MED ORDER — KETOCONAZOLE 2 % EX CREA
1.0000 | TOPICAL_CREAM | Freq: Every day | CUTANEOUS | 3 refills | Status: DC
  Filled 2023-08-13: qty 60, 30d supply, fill #0

## 2023-08-28 ENCOUNTER — Other Ambulatory Visit (HOSPITAL_COMMUNITY): Payer: Self-pay

## 2023-09-09 DIAGNOSIS — H2523 Age-related cataract, morgagnian type, bilateral: Secondary | ICD-10-CM | POA: Diagnosis not present

## 2023-09-09 DIAGNOSIS — H524 Presbyopia: Secondary | ICD-10-CM | POA: Diagnosis not present

## 2023-09-09 DIAGNOSIS — H35033 Hypertensive retinopathy, bilateral: Secondary | ICD-10-CM | POA: Diagnosis not present

## 2023-09-09 DIAGNOSIS — E113211 Type 2 diabetes mellitus with mild nonproliferative diabetic retinopathy with macular edema, right eye: Secondary | ICD-10-CM | POA: Diagnosis not present

## 2023-09-09 DIAGNOSIS — H04123 Dry eye syndrome of bilateral lacrimal glands: Secondary | ICD-10-CM | POA: Diagnosis not present

## 2023-09-09 DIAGNOSIS — H5203 Hypermetropia, bilateral: Secondary | ICD-10-CM | POA: Diagnosis not present

## 2023-09-24 ENCOUNTER — Other Ambulatory Visit (HOSPITAL_COMMUNITY): Payer: Self-pay

## 2023-11-04 DIAGNOSIS — H2513 Age-related nuclear cataract, bilateral: Secondary | ICD-10-CM | POA: Diagnosis not present

## 2023-11-04 DIAGNOSIS — E113213 Type 2 diabetes mellitus with mild nonproliferative diabetic retinopathy with macular edema, bilateral: Secondary | ICD-10-CM | POA: Diagnosis not present

## 2023-11-04 DIAGNOSIS — H35031 Hypertensive retinopathy, right eye: Secondary | ICD-10-CM | POA: Diagnosis not present

## 2023-11-09 DIAGNOSIS — F331 Major depressive disorder, recurrent, moderate: Secondary | ICD-10-CM | POA: Diagnosis not present

## 2023-11-09 DIAGNOSIS — K760 Fatty (change of) liver, not elsewhere classified: Secondary | ICD-10-CM | POA: Diagnosis not present

## 2023-11-09 DIAGNOSIS — I119 Hypertensive heart disease without heart failure: Secondary | ICD-10-CM | POA: Diagnosis not present

## 2023-11-09 DIAGNOSIS — I6523 Occlusion and stenosis of bilateral carotid arteries: Secondary | ICD-10-CM | POA: Diagnosis not present

## 2023-11-09 DIAGNOSIS — E785 Hyperlipidemia, unspecified: Secondary | ICD-10-CM | POA: Diagnosis not present

## 2023-11-09 DIAGNOSIS — I7121 Aneurysm of the ascending aorta, without rupture: Secondary | ICD-10-CM | POA: Diagnosis not present

## 2023-11-09 DIAGNOSIS — K219 Gastro-esophageal reflux disease without esophagitis: Secondary | ICD-10-CM | POA: Diagnosis not present

## 2023-11-09 DIAGNOSIS — G4733 Obstructive sleep apnea (adult) (pediatric): Secondary | ICD-10-CM | POA: Diagnosis not present

## 2023-11-09 DIAGNOSIS — E1149 Type 2 diabetes mellitus with other diabetic neurological complication: Secondary | ICD-10-CM | POA: Diagnosis not present

## 2023-11-10 ENCOUNTER — Other Ambulatory Visit (HOSPITAL_COMMUNITY): Payer: Self-pay

## 2023-11-10 ENCOUNTER — Telehealth: Payer: Self-pay

## 2023-11-10 MED ORDER — CLONAZEPAM 1 MG PO TABS
1.0000 mg | ORAL_TABLET | Freq: Every day | ORAL | 3 refills | Status: AC
  Filled 2023-11-10: qty 90, 90d supply, fill #0

## 2023-11-10 MED ORDER — ASPIRIN 81 MG PO TBEC
81.0000 mg | DELAYED_RELEASE_TABLET | Freq: Every day | ORAL | 4 refills | Status: AC
  Filled 2023-11-10: qty 90, 90d supply, fill #0

## 2023-11-10 NOTE — Telephone Encounter (Signed)
 Received referral from Dr [name] at [facility] for sleep apnea eval, needs new machine. Placed in sleep mailbox

## 2023-11-11 ENCOUNTER — Other Ambulatory Visit (HOSPITAL_COMMUNITY): Payer: Self-pay

## 2023-11-12 ENCOUNTER — Other Ambulatory Visit (HOSPITAL_COMMUNITY): Payer: Self-pay

## 2023-11-12 MED ORDER — ONETOUCH DELICA LANCETS 33G MISC
11 refills | Status: AC
  Filled 2023-11-12: qty 100, 31d supply, fill #0

## 2023-11-12 MED ORDER — ONETOUCH VERIO FLEX SYSTEM W/DEVICE KIT
PACK | 1 refills | Status: AC
  Filled 2023-11-12: qty 1, 30d supply, fill #0

## 2023-11-12 MED ORDER — GLUCOSE BLOOD VI STRP
ORAL_STRIP | 11 refills | Status: AC
  Filled 2023-11-12: qty 100, 30d supply, fill #0

## 2023-12-02 ENCOUNTER — Other Ambulatory Visit (HOSPITAL_COMMUNITY): Payer: Self-pay

## 2023-12-02 DIAGNOSIS — I10 Essential (primary) hypertension: Secondary | ICD-10-CM | POA: Diagnosis not present

## 2023-12-02 DIAGNOSIS — E785 Hyperlipidemia, unspecified: Secondary | ICD-10-CM | POA: Diagnosis not present

## 2023-12-02 DIAGNOSIS — G4733 Obstructive sleep apnea (adult) (pediatric): Secondary | ICD-10-CM | POA: Diagnosis not present

## 2023-12-02 DIAGNOSIS — K760 Fatty (change of) liver, not elsewhere classified: Secondary | ICD-10-CM | POA: Diagnosis not present

## 2023-12-02 DIAGNOSIS — E1149 Type 2 diabetes mellitus with other diabetic neurological complication: Secondary | ICD-10-CM | POA: Diagnosis not present

## 2023-12-02 MED ORDER — ROSUVASTATIN CALCIUM 5 MG PO TABS
5.0000 mg | ORAL_TABLET | ORAL | 3 refills | Status: DC
  Filled 2023-12-02: qty 24, 84d supply, fill #0

## 2023-12-02 MED ORDER — FREESTYLE LIBRE 3 PLUS SENSOR MISC
11 refills | Status: AC
Start: 2023-12-02 — End: ?
  Filled 2023-12-02: qty 2, 30d supply, fill #0

## 2023-12-02 MED ORDER — AMLODIPINE BESYLATE 2.5 MG PO TABS
2.5000 mg | ORAL_TABLET | Freq: Every day | ORAL | 3 refills | Status: DC
  Filled 2023-12-02: qty 90, 90d supply, fill #0

## 2023-12-02 MED ORDER — EZETIMIBE 10 MG PO TABS
10.0000 mg | ORAL_TABLET | Freq: Every day | ORAL | 3 refills | Status: DC
  Filled 2023-12-02: qty 90, 90d supply, fill #0

## 2023-12-03 ENCOUNTER — Other Ambulatory Visit: Payer: Self-pay

## 2023-12-08 ENCOUNTER — Ambulatory Visit (INDEPENDENT_AMBULATORY_CARE_PROVIDER_SITE_OTHER): Admitting: Neurology

## 2023-12-08 ENCOUNTER — Encounter: Payer: Self-pay | Admitting: Neurology

## 2023-12-08 VITALS — BP 148/98 | HR 85 | Ht 77.0 in | Wt 373.5 lb

## 2023-12-08 DIAGNOSIS — G4733 Obstructive sleep apnea (adult) (pediatric): Secondary | ICD-10-CM | POA: Diagnosis not present

## 2023-12-08 DIAGNOSIS — R634 Abnormal weight loss: Secondary | ICD-10-CM | POA: Diagnosis not present

## 2023-12-08 DIAGNOSIS — R0681 Apnea, not elsewhere classified: Secondary | ICD-10-CM

## 2023-12-08 NOTE — Progress Notes (Signed)
 Subjective:    Patient ID: Stephen Dunlap is a 53 y.o. male.  HPI   [name], MD, PhD [facility] [address], [address] [address] [place], [place] [address]  Dear Dr. [name],  I saw your patient, [name], upon your kind request in my clinic today for evaluation of his sleep apnea.  The patient is unaccompanied today.  As you know, [name] is a 53 year old male with an underlying medical history of hyperlipidemia, degenerative ) status post ACDF in 2019, diabetes, thoracic aortic aneurysm, history of TIA, UTI, prostatitis, and morbid obesity with a BMI of over 40, who was previously diagnosed with obstructive sleep apnea placed on PAP therapy.  He is currently no longer on PAP therapy.  He reports snoring and excessive daytime somnolence, as well as witnessed apneas.  Previously benefited from treatment and would like to get back on a new machine.  He has not his current machine in at least 8 or 9 months and uses it off-and-on prior to that for years.  We checked online data on the [employer] website and there was no compliance download available within the past year.  He did not bring his machine today. Bedtime is generally between 9:30 PM and 10 and rise time at 4:30 AM.  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  This Coffee, usually 2 cups in the morning.  He does not smoke any cigarettes and has not had a cigar in some time.  He drinks alcohol  occasionally, not daily.  He denies recurrent nocturia or recurrent nocturnal morning headaches.  He is not aware of any family history of sleep apnea.  He has been working on weight loss and better diabetes control.  His Epworth sleepiness score is 4 out of 24, fatigue severity score is 48 out of 63 I reviewed your office note from 11/09/2023. I had evaluated him in 2018.  He has been lost to follow-up since 2019. His home sleep test from 07/22/2017 showed severe obstructive sleep  apnea with a total AHI of 43.2/hour and O2 nadir of 66% as well as significant time below 89% saturation of 36.8 minutes.   He has had interim weight loss, compared to when we first met in late 2018, his weight is about 30 pounds less.  Previously (copied from previous notes for reference):    04/01/2018: [name] is a 53 year old right-handed gentleman with an underlying medical history of type 2 diabetes, diabetic neuropathy, hypertension, fatty liver, reflux disease, morbid obesity with a BMI of over 45, history of depression, hyperlipidemia and recent hospital admission in October 2018 for concern for TIA with transient left facial droop and vision change, who presents for follow-up consultation of his severe obstructive sleep apnea. The patient is accompanied by his wife today. I last saw him on 12/28/2017, at which time we talked about his home sleep test results. He was requesting resubmission for an attended sleep study. I resubmitted request for sleep study testing but unfortunately was denied by his insurance and he was willing to consider AutoPap therapy at home. We submit this through [facility].   I reviewed his AutoPap compliance data from 01/23/2018 through 02/21/2018 which is a total of 30 days during which time he used his AutoPap 18 days with percent used days greater than 4 hours at 57%, indicating suboptimal compliance. He has had ongoing 2 days of usage in the past  30 days. He called in the interim reporting that he had not been able to use his AutoPap because of recent neck surgery. He had recent cervical ACDF in July 2019. He has not been able to use his AutoPap consistently since his setup date which was 01/06/2018. In the past 84 days since set up on 01/06/2018 he has used his AutoPap 31 out of 84 days and percent used days greater than 4 hours was suboptimal unfortunately at 25% only. Residual AHI generally below 5 per hour, with the most consistent usage in late June  through late July residual AHI was 2.2 per hour, leak on the low side, 95th percentile pressure between 10.3 cm and 10.8 cm. He reports that when he was able to use his AutoPap he actually felt better, he was better rested and his wife endorses that he slept quite well with it. In June he started having radiating neck pain to the right arm and numbness in his fingers, he was found to have significant neck disease and required surgery in July. He has been recuperating from this. Initially after the surgery he had significant neck pain and difficulty moving. He also reports that lately his power cord from the machine has been wiggly and the machine loses power as the cord comes off. He would be willing to continue with AutoPap as he also noticed an improvement in his sleep quality and daytime symptoms. He is motivated to get back on track with his AutoPap machine. He has not been driving yet.       I first met him on 06/09/2017 at the request of his primary care physician, at which time the patient reported a prior diagnosis of obstructive sleep apnea. He was no longer using his CPAP machine. I advised him to proceed with a sleep study testing. His insurance denied a lab attended sleep study. He had a home sleep test on 07/22/2017 which showed severe sleep apnea with an overall AHI of 43.2 per hour, average oxygen saturation of 93%, nadir of 66% with significant time below 88% or at 88% saturation of 36.8 minutes for the night, total testing time was 7 hours and 20 minutes. He was advised to proceed with positive airway pressure treatment. He did not proceed with treatment secondary to cost.     06/09/2017: (He) was previously diagnosed with obstructive sleep apnea and placed on CPAP therapy. He had a home sleep test in May 2013 which showed an AHI of 38.5/hour, O2 nadir of 60%. He has not been using a CPAP machine for the past at least 10-years. He essentially stopped using his CPAP when his first wife died.  He has remarried since then. He has 2 children from his first marriage, ages 46 and 62. He works at a [employer]. He was recently admitted on 04/29/2017 and discharged on 04/30/2017. He had a left facial droop that lasted less than a minute. He had TIA workup including CT head without contrast which showed no acute intracranial pathology, MRI and MRA brain showed normal findings, he had carotid Doppler testing which showed 1-39% ICA stenosis bilaterally, hemoglobin A1c was 8.5, LDL 133. He was advised to start a baby aspirin  and take a statin. I reviewed your office note from 05/06/2017. His Epworth sleepiness score is 8 out of 24, fatigue score is 34 out of 63. He lives with his wife.  He is a nonsmoker. He drinks alcohol  occasionally. His bedtime varies. When he works night shift goes to bed  around 8 AM and wakes up typically before noon. He takes clonazepam  for sleep. He has a normal difficult time sleeping at night. He has rotating shift. His weight has been fluctuating. He reports nocturia about once per average night and occasional morning headaches.  His Past Medical History Is Significant For: Past Medical History:  Diagnosis Date   COVID 05/2019   high fever of 103 body aches x 2 weeks all symptoms resolved   DM type 2 (diabetes mellitus, type 2) (HCC)    Morbid obesity (HCC)    Prostatitis    Sleep apnea    CPAP machine, 4-10 pressure settings   Thoracic ascending aortic aneurysm (HCC)    4 cm per chest angio ct 05-18-2020 followed by dr Landon Pinion [facility]  for   TIA (transient ischemic attack)    October 2018, possible?   UTI (urinary tract infection)     His Past Surgical History Is Significant For: Past Surgical History:  Procedure Laterality Date   ANTERIOR CERVICAL DECOMP/DISCECTOMY FUSION N/A 02/08/2018   Procedure: Anterior Cervical Decompression Fusion - Cervical Six-Cervical Seven - Cervical Seven -Thoracic One;  Surgeon: Agustina Aldrich, MD;  Location: [facility] OR;   Service: Neurosurgery;  Laterality: N/A;   BUNIONECTOMY Left 06/25/2020   Procedure: BUNIONECTOMY AND AKIN;  Surgeon: Radene Buffalo, DPM;  Location: [facility];  Service: Podiatry;  Laterality: Left;   cysto bedside  yrs ago   HAMMER TOE SURGERY Left 06/25/2020   Procedure: HAMMER TOE REPAIR;  Surgeon: Radene Buffalo, DPM;  Location: [facility];  Service: Podiatry;  Laterality: Left;   HERNIA REPAIR  2014   umbilical   WISDOM TOOTH EXTRACTION  yrs ago    His Family History Is Significant For: Family History  Problem Relation Age of Onset   Arthritis Other    Cancer Other        breast   Diabetes Other    Hypertension Other    Hyperlipidemia Other     His Social History Is Significant For: Social History   Socioeconomic History   Marital status: Married    Spouse name: Not on file   Number of children: 3   Years of education: Not on file   Highest education level: Not on file  Occupational History   Occupation: Geneticist, molecular  Tobacco Use   Smoking status: Never   Smokeless tobacco: Never  Vaping Use   Vaping status: Never Used  Substance and Sexual Activity   Alcohol  use: Yes    Alcohol /week: 2.0 standard drinks of alcohol     Types: 2 Cans of beer per week    Comment: Socially, not weekly   Drug use: No   Sexual activity: Yes  Other Topics Concern   Not on file  Social History Narrative   Not on file   Social Drivers of Health   Financial Resource Strain: Not on file  Food Insecurity: Not on file  Transportation Needs: Not on file  Physical Activity: Not on file  Stress: Not on file  Social Connections: Not on file    His Allergies Are:  Allergies  Allergen Reactions   Other Anaphylaxis    Tree nuts   Metformin     Canagliflozin  Other (See Comments)    Yeast infection  Other Reaction(s): Unknown  :   His Current Medications Are:  Outpatient Encounter Medications as of 12/08/2023  Medication Sig    albuterol  (PROVENTIL  HFA;VENTOLIN  HFA) 108 (90 BASE) MCG/ACT inhaler Inhale 2  puffs into the lungs every 6 (six) hours as needed for wheezing or shortness of breath.   ammonium lactate  (AMLACTIN) 12 % lotion Apply 1 Application topically as needed for dry skin.   aspirin  EC (ASPIRIN  81) 81 MG tablet TAKE 1 TABLET BY MOUTH ONCE DAILY.   Blood Glucose Monitoring Suppl (ONETOUCH VERIO FLEX SYSTEM) w/Device KIT Use to test blood sugars 3 times daily.   clonazePAM  (KLONOPIN ) 1 MG tablet TAKE 1 TABLET BY MOUTH AT BEDTIME   Continuous Glucose Sensor (FREESTYLE LIBRE 3 PLUS SENSOR) MISC Use to monitor blood glucose continuously. Change every 15 days.   empagliflozin  (JARDIANCE ) 25 MG TABS tablet Take 1 tablet (25 mg total) by mouth daily.   EPINEPHrine  (AUVI-Q ) 0.3 mg/0.3 mL IJ SOAJ injection Inject 0.3 mg into the muscle once.   glucose blood test strip Use to test blood sugars 3 times daily.   HYDROcodone -acetaminophen  (NORCO/VICODIN) 5-325 MG tablet Take 1-2 tablets by mouth every 4 (four) hours as needed for moderate pain ((score 4 to 6)).   ibuprofen  (ADVIL ,MOTRIN ) 800 MG tablet Take 800 mg by mouth every 8 (eight) hours as needed for mild pain or moderate pain.    ketoconazole  (NIZORAL ) 2 % cream Apply to the affected area daily as directed   losartan  (COZAAR ) 100 MG tablet Take 1 tablet (100 mg total) by mouth daily.   montelukast  (SINGULAIR ) 10 MG tablet Take 1 tablet (10 mg total) by mouth daily.   olopatadine  (PATANOL) 0.1 % ophthalmic solution INSTILL 1 DROP INTO BOTH EYES DAILY   omeprazole  (PRILOSEC) 20 MG capsule Take 1 capsule (20 mg total) by mouth daily.   OneTouch Delica Lancets 33G MISC Use 3 times daily to check blood sugars.   pregabalin  (LYRICA ) 75 MG capsule Take 1 capsule (75 mg total) by mouth 2 (two) times daily.   tirzepatide  (MOUNJARO ) 15 MG/0.5ML Pen Inject 15 mg into the skin once a week.   [DISCONTINUED] amLODipine  (NORVASC ) 2.5 MG tablet Take 1 tablet (2.5 mg total) by  mouth daily. (Patient not taking: Reported on 12/08/2023)   [DISCONTINUED] amLODipine  (NORVASC ) 2.5 MG tablet Take 1 tablet (2.5 mg total) by mouth daily. (Patient not taking: Reported on 12/08/2023)   [DISCONTINUED] Azelastine -Fluticasone  (DYMISTA ) 137-50 MCG/ACT SUSP Place 1 spray into both nostrils 2 (two) times daily. (Patient not taking: Reported on 12/08/2023)   [DISCONTINUED] diclofenac  (VOLTAREN ) 75 MG EC tablet Take 1 tablet (75 mg total) by mouth 2 (two) times daily with meals. (Patient not taking: Reported on 12/08/2023)   [DISCONTINUED] ezetimibe  (ZETIA ) 10 MG tablet Take 1 tablet (10 mg total) by mouth daily. (Patient not taking: Reported on 12/08/2023)   [DISCONTINUED] ezetimibe  (ZETIA ) 10 MG tablet Take 1 tablet (10 mg total) by mouth daily. (Patient not taking: Reported on 12/08/2023)   [DISCONTINUED] ezetimibe  (ZETIA ) 10 MG tablet Take 1 tablet (10 mg total) by mouth daily. (Patient not taking: Reported on 12/08/2023)   [DISCONTINUED] JANUMET  50-1000 MG tablet Take 1 tablet by mouth 2 (two) times daily with a meal.  (Patient not taking: Reported on 12/08/2023)   [DISCONTINUED] ketoconazole  (NIZORAL ) 2 % cream Apply to affected area once daily as directed for 14 days (Patient not taking: Reported on 12/08/2023)   [DISCONTINUED] ketoconazole  (NIZORAL ) 2 % cream Apply topically daily to the affected areas as directed for 14 days. (Patient not taking: Reported on 12/08/2023)   [DISCONTINUED] losartan  (COZAAR ) 50 MG tablet Take 1 tablet (50 mg total) by mouth daily. (Patient not taking: Reported on 12/08/2023)   [  DISCONTINUED] rosuvastatin  (CRESTOR ) 5 MG tablet Take 1 tablet (5 mg total) by mouth 2 (two) times a week. (Patient not taking: Reported on 12/08/2023)   No facility-administered encounter medications on file as of 12/08/2023.  :   Review of Systems:  Out of a complete 14 point review of systems, all are reviewed and negative with the exception of these symptoms as listed below:  Pt  presents today for a New Patient sleep consult. ESS SCORE 4, FSS SCORE OF 48. Pt stated that he doesn't feel well rested. Wakes frequently during the night and occasionally has issues getting back to sleep. Patient stated that he previously had cpap machine and would like to get reestablished on a machine.  Review of Systems  Objective:  Neurological Exam  Physical Exam Physical Examination:   Vitals:   12/08/23 1028 12/08/23 1030  BP: (!) 150/106 (!) 148/98  Pulse:  85    General Examination: The patient is a very pleasant 53 y.o. male in no acute distress. He appears well-developed and well-nourished and very well groomed.   HEENT: Normocephalic, atraumatic, pupils are equal, round and reactive to light, extraocular tracking is good without limitation to gaze excursion or nystagmus noted. Hearing is grossly intact. Face is symmetric with normal facial animation. Speech is clear with no dysarthria noted. There is no hypophonia. There is no lip, neck/head, jaw or voice tremor. Neck is supple with full range of passive and active motion. There are no carotid bruits on auscultation. Oropharynx exam reveals: mild mouth dryness, adequate dental hygiene and moderate airway crowding, due to larger tongue, large uvula, tonsils in place, about 1+ bilaterally. Mallampati is class II. Tongue protrudes centrally and palate elevates symmetrically. Neck size is 20.25 inches. He has a Mild overbite.   Chest: Clear to auscultation without wheezing, rhonchi or crackles noted.  Heart: S1+S2+0, regular and normal without murmurs, rubs or gallops noted.   Abdomen: Soft, non-tender and non-distended.  Extremities: There is no pitting edema in the distal lower extremities bilaterally.   Skin: Warm and dry without trophic changes noted.   Musculoskeletal: exam reveals no obvious joint deformities.   Neurologically:  Mental status: The patient is awake, alert and oriented in all 4 spheres. His immediate and  remote memory, attention, language skills and fund of knowledge are appropriate. There is no evidence of aphasia, agnosia, apraxia or anomia. Speech is clear with normal prosody and enunciation. Thought process is linear. Mood is normal and affect is normal.  Cranial nerves II - XII are as described above under HEENT exam.  Motor exam: Normal bulk, strength and tone is noted. There is no obvious action or resting tremor.  Fine motor skills and coordination: grossly intact.  Cerebellar testing: No dysmetria or intention tremor. There is no truncal or gait ataxia.  Sensory exam: intact to light touch in the upper and lower extremities.  Gait, station and balance: He stands easily. No veering to one side is noted. No leaning to one side is noted. Posture is age-appropriate and stance is narrow based. Gait shows normal stride length and normal pace. No problems turning are noted.   Assessment and Plan:  In summary, XYION VIRDEN is a 53 year old male with an underlying medical history of hyperlipidemia, degenerative ) status post ACDF in 2019, diabetes, thoracic aortic aneurysm, history of TIA, UTI, prostatitis, and morbid obesity with a BMI of over 40, who presents for evaluation of his obstructive sleep apnea which was deemed in the severe range  by home sleep testing in 2018.  He has not been on his AutoPap machine in years.  I had a long chat with the patient about my findings and the diagnosis of sleep apnea, particularly OSA, its prognosis and treatment options. We talked about medical/conservative treatments, surgical interventions and non-pharmacological approaches for symptom control. I explained, in particular, the risks and ramifications of untreated moderate to severe OSA, especially with respect to developing cardiovascular disease down the road, including congestive heart failure (CHF), difficult to treat hypertension, cardiac arrhythmias (particularly A-fib), neurovascular complications including  TIA, stroke and dementia. Even type 2 diabetes has, in part, been linked to untreated OSA. Symptoms of untreated OSA may include (but may not be limited to) daytime sleepiness, nocturia (i.e. frequent nighttime urination), memory problems, mood irritability and suboptimally controlled or worsening mood disorder such as depression and/or anxiety, lack of energy, lack of motivation, physical discomfort, as well as recurrent headaches, especially morning or nocturnal headaches. We talked about the importance of maintaining a healthy lifestyle and striving for healthy weight.  I recommended a sleep study at this time. I outlined the differences between a laboratory attended sleep study which is considered more comprehensive and accurate over the option of a home sleep test (HST); the latter may lead to underestimation of sleep disordered breathing in some instances and does not help with diagnosing upper airway resistance syndrome and is not accurate enough to diagnose primary central sleep apnea typically.  We mutually agreed to proceed with a home sleep test at this time. I outlined possible surgical and non-surgical treatment options of OSA, including the use of a positive airway pressure (PAP) device (i.e. CPAP, AutoPAP/APAP or BiPAP in certain circumstances), a custom-made dental device (aka oral appliance, which would require a referral to a specialist dentist or orthodontist typically, and is generally speaking not considered for patients with full dentures or edentulous state), upper airway surgical options, such as traditional UPPP (which is not considered a first-line treatment) or the Inspire device (hypoglossal nerve stimulator, which would involve a referral for consultation with an ENT surgeon, after careful selection, following inclusion criteria - also not first-line treatment). I explained the PAP treatment option to the patient in detail, as this is generally considered first-line treatment.  The  patient indicated that he would be willing to try PAP therapy again, if the need arises. I explained the importance of being compliant with PAP treatment, not only for insurance purposes but primarily to improve patient's symptoms symptoms, and for the patient's long term health benefit, including to reduce His cardiovascular risks longer-term.    We will pick up our discussion about the next steps and treatment options after testing.  We will keep him posted as to the test results by phone call and/or MyChart messaging where possible.  We will plan to follow-up in sleep clinic accordingly as well.  I answered all his questions today and the patient was in agreement.   I encouraged him to call with any interim questions, concerns, problems or updates or email us  through MyChart.  Generally speaking, sleep test authorizations may take up to 2 weeks, sometimes less, sometimes longer, the patient is encouraged to get in touch with us  if they do not hear back from the sleep lab staff directly within the next 2 weeks.  Thank you very much for allowing me to participate in the care of this nice patient. If I can be of any further assistance to you please do not hesitate to  call me at [phone].  Sincerely,   [name], MD, PhD

## 2023-12-08 NOTE — Patient Instructions (Signed)
 It was nice to see you again today.   As discussed, we will proceed with a home sleep test (HST) to re-establish your sleep apnea diagnosis and to get you a new machine. Our sleep lab staff will reach out to you to arrange for pickup and for tutorial of your test equipment - you will do the test at home that night and bring the test sensors back for data analysis the next day or whenever you are scheduled for drop off of your test equipment. I will write for a new machine after your HST confirms your obstructive sleep apnea diagnosis.   We will schedule a follow-up appointment after set up with your new machine, typically within 31 to 89 days post treatment start. You will need to show compliance with usage and fulfill a minimum usage percentage (this is an insurance requirement).  After you have done your home sleep test, you can resume using your current machine until you get a new one.   Please use your PAP regularly. While your insurance requires that you use CPAP/autoPAP at least 4 hours each night on 70% of the nights, I recommend, that you not skip any nights and use it throughout the night if you can. Getting used to CPAP and staying with the treatment long term does take time and patience and discipline. Untreated obstructive sleep apnea when it is moderate to severe can have an adverse impact on cardiovascular health and raise her risk for heart disease, arrhythmias, hypertension, congestive heart failure, stroke and diabetes. Untreated obstructive sleep apnea causes sleep disruption, nonrestorative sleep, and sleep deprivation. This can have an impact on your day to day functioning and cause daytime sleepiness and impairment of cognitive function, memory loss, mood disturbance, and problems focussing. Using CPAP regularly can improve these symptoms.

## 2023-12-10 ENCOUNTER — Other Ambulatory Visit (HOSPITAL_COMMUNITY): Payer: Self-pay

## 2023-12-14 ENCOUNTER — Other Ambulatory Visit (HOSPITAL_COMMUNITY): Payer: Self-pay

## 2023-12-18 ENCOUNTER — Ambulatory Visit (INDEPENDENT_AMBULATORY_CARE_PROVIDER_SITE_OTHER): Admitting: Neurology

## 2023-12-18 DIAGNOSIS — G4733 Obstructive sleep apnea (adult) (pediatric): Secondary | ICD-10-CM

## 2023-12-18 DIAGNOSIS — G4734 Idiopathic sleep related nonobstructive alveolar hypoventilation: Secondary | ICD-10-CM

## 2023-12-18 DIAGNOSIS — R0681 Apnea, not elsewhere classified: Secondary | ICD-10-CM

## 2023-12-18 DIAGNOSIS — R634 Abnormal weight loss: Secondary | ICD-10-CM

## 2023-12-30 ENCOUNTER — Other Ambulatory Visit (HOSPITAL_COMMUNITY): Payer: Self-pay | Admitting: Registered Nurse

## 2023-12-30 ENCOUNTER — Other Ambulatory Visit (HOSPITAL_COMMUNITY): Payer: Self-pay

## 2023-12-30 ENCOUNTER — Ambulatory Visit (HOSPITAL_COMMUNITY)
Admission: RE | Admit: 2023-12-30 | Discharge: 2023-12-30 | Disposition: A | Discharge status: Home / Self Care | Source: Ambulatory Visit | Attending: Registered Nurse | Admitting: Registered Nurse

## 2023-12-30 DIAGNOSIS — I10 Essential (primary) hypertension: Secondary | ICD-10-CM | POA: Diagnosis not present

## 2023-12-30 DIAGNOSIS — N5089 Other specified disorders of the male genital organs: Secondary | ICD-10-CM

## 2023-12-30 DIAGNOSIS — E1149 Type 2 diabetes mellitus with other diabetic neurological complication: Secondary | ICD-10-CM | POA: Diagnosis not present

## 2023-12-30 DIAGNOSIS — N433 Hydrocele, unspecified: Secondary | ICD-10-CM | POA: Diagnosis not present

## 2023-12-30 DIAGNOSIS — N451 Epididymitis: Secondary | ICD-10-CM | POA: Diagnosis not present

## 2023-12-30 MED ORDER — CIPROFLOXACIN HCL 500 MG PO TABS
500.0000 mg | ORAL_TABLET | Freq: Two times a day (BID) | ORAL | 0 refills | Status: AC
  Filled 2023-12-30: qty 28, 14d supply, fill #0

## 2023-12-31 ENCOUNTER — Other Ambulatory Visit (HOSPITAL_COMMUNITY): Payer: Self-pay

## 2024-01-01 ENCOUNTER — Other Ambulatory Visit (HOSPITAL_BASED_OUTPATIENT_CLINIC_OR_DEPARTMENT_OTHER): Payer: Self-pay

## 2024-01-01 ENCOUNTER — Other Ambulatory Visit (HOSPITAL_COMMUNITY): Payer: Self-pay

## 2024-01-01 MED ORDER — MOUNJARO 15 MG/0.5ML ~~LOC~~ SOAJ
15.0000 mg | SUBCUTANEOUS | 5 refills | Status: AC
  Filled 2024-01-01: qty 2, 28d supply, fill #0
  Filled 2024-01-20 – 2024-01-22 (×2): qty 2, 28d supply, fill #1
  Filled 2024-05-31: qty 2, 28d supply, fill #2
  Filled 2024-07-13: qty 2, 28d supply, fill #3
  Filled 2024-08-11: qty 2, 28d supply, fill #4

## 2024-01-01 MED ORDER — PREGABALIN 75 MG PO CAPS
75.0000 mg | ORAL_CAPSULE | Freq: Two times a day (BID) | ORAL | 3 refills | Status: AC
  Filled 2024-01-01: qty 60, 30d supply, fill #0

## 2024-01-11 DIAGNOSIS — N430 Encysted hydrocele: Secondary | ICD-10-CM | POA: Diagnosis not present

## 2024-01-11 DIAGNOSIS — N451 Epididymitis: Secondary | ICD-10-CM | POA: Diagnosis not present

## 2024-01-19 ENCOUNTER — Other Ambulatory Visit (HOSPITAL_COMMUNITY): Payer: Self-pay

## 2024-01-19 MED ORDER — JARDIANCE 25 MG PO TABS
25.0000 mg | ORAL_TABLET | Freq: Every day | ORAL | 4 refills | Status: AC
  Filled 2024-01-19: qty 30, 30d supply, fill #0
  Filled 2024-08-11: qty 30, 30d supply, fill #1

## 2024-01-20 ENCOUNTER — Other Ambulatory Visit (HOSPITAL_COMMUNITY): Payer: Self-pay

## 2024-01-21 ENCOUNTER — Other Ambulatory Visit (HOSPITAL_COMMUNITY): Payer: Self-pay

## 2024-01-28 ENCOUNTER — Other Ambulatory Visit (HOSPITAL_COMMUNITY): Payer: Self-pay

## 2024-02-03 NOTE — Progress Notes (Signed)
 See procedure note.

## 2024-02-05 ENCOUNTER — Ambulatory Visit: Payer: Self-pay | Admitting: Neurology

## 2024-02-05 DIAGNOSIS — G4734 Idiopathic sleep related nonobstructive alveolar hypoventilation: Secondary | ICD-10-CM

## 2024-02-05 DIAGNOSIS — G4733 Obstructive sleep apnea (adult) (pediatric): Secondary | ICD-10-CM

## 2024-02-05 NOTE — Procedures (Signed)
 [facility]  HOME SLEEP TEST (SANSA) REPORT (Mail-Out Device):   STUDY DATE: 01/20/2024  DOB: 1970/11/23  MRN: [id number]  ORDERING CLINICIAN: True Mar, MD, PhD   REFERRING CLINICIAN: Nichole Senior, MD   CLINICAL INFORMATION/HISTORY: 53 year old male with an underlying medical history of hyperlipidemia, degenerative ) status post ACDF in 2019, diabetes, thoracic aortic aneurysm, history of TIA, UTI, prostatitis, and morbid obesity with a BMI of over 40, who was previously diagnosed with obstructive sleep apnea placed on PAP therapy.  He is currently no longer on PAP therapy.  He reports snoring and excessive daytime somnolence, as well as witnessed apneas.    PATIENT'S LAST REPORTED EPWORTH SLEEPINESS SCORE (ESS): 4/24.  BMI (at the time of sleep clinic visit and/or test date): 44.3 kg/m  FINDINGS:   Study Protocol:    The SANSA single-point-of-skin-contact chest-worn sensor - an FDA cleared and DOT approved type 4 home sleep test device - measures eight physiological channels,  including blood oxygen saturation (measured via PPG [photoplethysmography]), EKG-derived heart rate, respiratory effort, chest movement (measured via accelerometer), snoring, body position, and actigraphy. The device is designed to be worn for up to 10 hours per study.   Sleep Summary:   Total Recording Time (hours, min): 7 hours, 59 min  Total Effective Sleep Time (hours, min):  6 hours, 55 min  Sleep Efficiency (%):    87%   Respiratory Indices:   Calculated sAHI (per hour):  14.7/hour         Oxygen Saturation Statistics:    Oxygen Saturation (%) Mean: 93.1%   Minimum oxygen saturation (%):                 70.5%   O2 Saturation Range (%): 70.5-98.7%   Time below or at 88% saturation: 33 min   Pulse Rate Statistics:   Pulse Mean (bpm):    74/min    Pulse Range (63- 111/min)   Snoring: Mild to moderate  IMPRESSION/DIAGNOSES:   OSA (obstructive sleep apnea)   Nocturnal Hypoxemia  RECOMMENDATIONS:   This home sleep test demonstrates near-moderate obstructive sleep apnea -  by number of events - with a total AHI of 14.7/hour and O2 nadir of 70.5% with significant time below or at 88% saturation of over 30 minutes for the night, indicating nocturnal hypoxemia.  Mild to moderate snoring was detected. Treatment with a positive airway pressure (PAP) device is recommended. The patient will be advised to proceed with an autoPAP titration/trial at home for now. A full night titration study may be considered to optimize treatment settings, monitor proper oxygen saturations and aid with improvement of tolerance and adherence, if needed down the road. Alternative treatment options may include a dental device through dentistry or orthodontics in selected patients or Inspire (hypoglossal nerve stimulator) in carefully selected patients (meeting inclusion criteria).  Concomitant weight loss is recommended (where clinically appropriate). Please note that untreated obstructive sleep apnea may carry additional perioperative morbidity. Patients with significant obstructive sleep apnea should receive perioperative PAP therapy and the surgeons and particularly the anesthesiologist should be informed of the diagnosis and the severity of the sleep disordered breathing. The patient should be cautioned not to drive, work at heights, or operate dangerous or heavy equipment when tired or sleepy. Review and reiteration of good sleep hygiene measures should be pursued with any patient. Other causes of the patient's symptoms, including circadian rhythm disturbances, an underlying mood disorder, medication effect and/or an underlying medical problem cannot be ruled out based  on this test. Clinical correlation is recommended.  The patient and his referring provider will be notified of the test results. The patient will be seen in follow up in sleep clinic at [facility].  I certify that I have  reviewed the raw data recording prior to the issuance of this report in accordance with the standards of the American Academy of Sleep Medicine (AASM).    INTERPRETING PHYSICIAN:   True Mar, MD, PhD Medical Director, [facility]) Diplomat, ABPN (Neurology and Sleep)   [facility] [address], [address] [place], [place] [address] [phone]

## 2024-02-08 NOTE — Telephone Encounter (Signed)
 Relayed results of sleep study.  Ok to use DME [employer].  Sent order.  He is to use 4 hrs or more for insurance compliance.  Appt made 05-04-2024 at 0745. Pt familiar with use of cpap.  Verbalized understanding of plan.  Will call back if needed.

## 2024-02-08 NOTE — Telephone Encounter (Signed)
-----   Message from True Mar sent at 02/05/2024  1:08 PM EDT ----- Patient had a home sleep test for reevaluation of his OSA.  Please advise patient that his home sleep test showed obstructive sleep apnea and confirmed his diagnosis, I would like to get him started  on home AutoPap therapy.  Please go over the details of AutoPap set up with him and expectations for compliance.  He will need to follow-up within 2 to 3 months after set up. ----- Message ----- From: Mar True, MD Sent: 02/05/2024   1:02 PM EDT To: True Mar, MD

## 2024-02-09 NOTE — Telephone Encounter (Signed)
 RE: new machine Received: Today New, Adine Neysa Nena GORMAN, RN; New, Bradley; Cain, Mitchell; Tucker, Dolanda; Ziegler, Melissa; 1 other Received, Thank you!     Previous Messages    ----- Message ----- From: Neysa Nena GORMAN, RN Sent: 02/08/2024  10:20 AM EDT To: Adine Leer; Avelina Sprung; Ephraim Dollar* Subject: new machine                                    New order in epic for pt.    Stephen Dunlap Male, 53 y.o., Jun 13, 1971 MRN: [id number] Phone: [phone]   Thanks,  Particia RN

## 2024-03-25 ENCOUNTER — Other Ambulatory Visit (HOSPITAL_COMMUNITY): Payer: Self-pay

## 2024-03-25 MED ORDER — ROSUVASTATIN CALCIUM 5 MG PO TABS
5.0000 mg | ORAL_TABLET | ORAL | 3 refills | Status: AC
  Filled 2024-03-25: qty 24, 90d supply, fill #0
  Filled 2024-08-11: qty 8, 28d supply, fill #1

## 2024-03-25 MED ORDER — EZETIMIBE 10 MG PO TABS
10.0000 mg | ORAL_TABLET | Freq: Every day | ORAL | 3 refills | Status: AC
  Filled 2024-03-25: qty 90, 90d supply, fill #0
  Filled 2024-08-11: qty 30, 30d supply, fill #1

## 2024-05-04 ENCOUNTER — Telehealth: Admitting: Neurology

## 2024-05-11 ENCOUNTER — Other Ambulatory Visit: Payer: Self-pay | Admitting: Thoracic Surgery (Cardiothoracic Vascular Surgery)

## 2024-05-11 DIAGNOSIS — I7121 Aneurysm of the ascending aorta, without rupture: Secondary | ICD-10-CM

## 2024-05-31 ENCOUNTER — Other Ambulatory Visit (HOSPITAL_COMMUNITY): Payer: Self-pay

## 2024-06-08 DIAGNOSIS — H2513 Age-related nuclear cataract, bilateral: Secondary | ICD-10-CM | POA: Diagnosis not present

## 2024-06-08 DIAGNOSIS — E113293 Type 2 diabetes mellitus with mild nonproliferative diabetic retinopathy without macular edema, bilateral: Secondary | ICD-10-CM | POA: Diagnosis not present

## 2024-06-10 ENCOUNTER — Ambulatory Visit (HOSPITAL_COMMUNITY)
Admission: RE | Admit: 2024-06-10 | Discharge: 2024-06-10 | Disposition: A | Discharge status: Home / Self Care | Source: Ambulatory Visit | Attending: Thoracic Surgery (Cardiothoracic Vascular Surgery) | Admitting: Thoracic Surgery (Cardiothoracic Vascular Surgery)

## 2024-06-10 DIAGNOSIS — I7121 Aneurysm of the ascending aorta, without rupture: Secondary | ICD-10-CM | POA: Diagnosis present

## 2024-06-10 LAB — POCT I-STAT CREATININE: Creatinine, Ser: 0.9 mg/dL (ref 0.61–1.24)

## 2024-06-10 MED ORDER — IOHEXOL 350 MG/ML SOLN
75.0000 mL | Freq: Once | INTRAVENOUS | Status: AC | PRN
  Administered 2024-06-10: 75 mL via INTRAVENOUS

## 2024-06-20 ENCOUNTER — Ambulatory Visit

## 2024-06-21 ENCOUNTER — Ambulatory Visit

## 2024-06-21 ENCOUNTER — Other Ambulatory Visit (HOSPITAL_COMMUNITY): Payer: Self-pay

## 2024-06-21 VITALS — BP 142/82 | HR 97 | Resp 20 | Ht 77.0 in | Wt 380.0 lb

## 2024-06-21 DIAGNOSIS — I7121 Aneurysm of the ascending aorta, without rupture: Secondary | ICD-10-CM

## 2024-06-21 MED ORDER — ALBUTEROL SULFATE HFA 108 (90 BASE) MCG/ACT IN AERS
2.0000 | INHALATION_SPRAY | Freq: Four times a day (QID) | RESPIRATORY_TRACT | 2 refills | Status: AC | PRN
  Filled 2024-06-21: qty 6.7, 25d supply, fill #0

## 2024-06-21 NOTE — Progress Notes (Signed)
 [address] [place] [address]             [phone]            Stephen Dunlap [id number] July 25, 1971   History of Present Illness:  Stephen Dunlap is a 53 year old man with medical history of hypertension, TIA, OSA, GERD, type 2 diabetes and hyperlipidemia who presents for continued follow-up of ascending thoracic aortic aneurysm.  On recent CTA of chest aneurysm measured 4.2 cm.  Echocardiogram from 2018 showed trileaflet aortic valve.   He presents to the clinic today and reports that he is doing well.  His blood pressure is slightly elevated at today's visit but he reports home readings in the 120s-130s/80s. 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 He does report having some episodes of chest tightness and feeling that his heart rate is high and it only lasts a couple minutes.  It is random and not associated with exertion. It is not currently happening.  He denies current chest pain, shortness of breath and lower leg edema.    Current Outpatient Medications on File Prior to Visit  Medication Sig Dispense Refill   albuterol  (PROVENTIL  HFA;VENTOLIN  HFA) 108 (90 BASE) MCG/ACT inhaler Inhale 2 puffs into the lungs every 6 (six) hours as needed for wheezing or shortness of breath. 1 Inhaler 2   ammonium lactate  (AMLACTIN) 12 % lotion Apply 1 Application topically as needed for dry skin. 452 g 0   aspirin  EC (ASPIRIN  81) 81 MG tablet TAKE 1 TABLET BY MOUTH ONCE DAILY. 90 tablet 4   Blood Glucose Monitoring Suppl (ONETOUCH VERIO FLEX SYSTEM) w/Device KIT Use to test blood sugars 3 times daily. 1 kit 1   ciprofloxacin  (CIPRO ) 500 MG tablet Take 1 tablet (500 mg total) by mouth 2 (two) times daily for 14 days. 28 tablet 0   clonazePAM  (KLONOPIN ) 1 MG tablet TAKE 1 TABLET BY MOUTH AT BEDTIME 90 tablet 3   Continuous Glucose Sensor (FREESTYLE LIBRE 3 PLUS SENSOR) MISC Use to monitor blood glucose continuously. Change every 15 days.  2 each 11   empagliflozin  (JARDIANCE ) 25 MG TABS tablet Take 1 tablet (25 mg total) by mouth daily. 90 tablet 4   EPINEPHrine  (AUVI-Q ) 0.3 mg/0.3 mL IJ SOAJ injection Inject 0.3 mg into the muscle once.     ezetimibe  (ZETIA ) 10 MG tablet Take 1 tablet (10 mg total) by mouth daily. 90 tablet 3   glucose blood test strip Use to test blood sugars 3 times daily. 100 each 11   HYDROcodone -acetaminophen  (NORCO/VICODIN) 5-325 MG tablet Take 1-2 tablets by mouth every 4 (four) hours as needed for moderate pain ((score 4 to 6)). 50 tablet 0   ibuprofen  (ADVIL ,MOTRIN ) 800 MG tablet Take 800 mg by mouth every 8 (eight) hours as needed for mild pain or moderate pain.      ketoconazole  (NIZORAL ) 2 % cream Apply to the affected area daily as directed 30 g 0   losartan  (COZAAR ) 100 MG tablet Take 1 tablet (100 mg total) by mouth daily. 90 tablet 3   montelukast  (SINGULAIR ) 10 MG tablet Take 1 tablet (10 mg total) by mouth daily. 90 tablet 3   olopatadine  (PATANOL) 0.1 % ophthalmic solution INSTILL 1 DROP INTO BOTH EYES DAILY 5 mL 0   omeprazole  (PRILOSEC) 20 MG capsule Take 1 capsule (20 mg total) by mouth daily. 90 capsule 3  OneTouch Delica Lancets 33G MISC Use 3 times daily to check blood sugars. 100 each 11   pregabalin  (LYRICA ) 75 MG capsule Take 1 capsule (75 mg total) by mouth 2 (two) times daily. 60 capsule 3   rosuvastatin  (CRESTOR ) 5 MG tablet Take 1 tablet (5 mg total) by mouth twice a week 30 tablet 3   tirzepatide  (MOUNJARO ) 15 MG/0.5ML Pen Inject 15 mg into the skin once a week. 2 mL 5   No current facility-administered medications on file prior to visit.     ROS: Review of Systems  Constitutional:  Negative for malaise/fatigue.  Respiratory: Negative.  Negative for cough and shortness of breath.   Cardiovascular: Negative.  Negative for chest pain and leg swelling.     There were no vitals taken for this visit.  Physical Exam Constitutional:      Appearance: Normal appearance.   HENT:     Head: Normocephalic and atraumatic.  Cardiovascular:     Rate and Rhythm: Normal rate and regular rhythm.     Heart sounds: Normal heart sounds, S1 normal and S2 normal.  Pulmonary:     Effort: Pulmonary effort is normal.     Breath sounds: Normal breath sounds.  Skin:    General: Skin is warm and dry.  Neurological:     General: No focal deficit present.     Mental Status: He is alert and oriented to person, place, and time.      Imaging: CLINICAL DATA:  History of mild aneurysmal dilatation of the ascending thoracic aorta.   EXAM: CT ANGIOGRAPHY CHEST WITH CONTRAST   TECHNIQUE: Multidetector CT imaging of the chest was performed using the standard protocol during bolus administration of intravenous contrast. Multiplanar CT image reconstructions and MIPs were obtained to evaluate the vascular anatomy.   RADIATION DOSE REDUCTION: This exam was performed according to the departmental dose-optimization program which includes automated exposure control, adjustment of the mA and/or kV according to patient size and/or use of iterative reconstruction technique.   CONTRAST:  75mL OMNIPAQUE  IOHEXOL  350 MG/ML SOLN   COMPARISON:  06/03/2023   FINDINGS: Cardiovascular: The aortic root measures approximately 3.7-3.8 cm at the level of the sinuses of Valsalva. The ascending thoracic aorta demonstrates stable mild dilatation with estimated maximum diameter of approximately 4.2 cm. The proximal arch measures 3.3 cm and the distal arch 2.6 cm. The descending thoracic aorta measures 2.7 cm. No evidence of aortic dissection. Visualized proximal great vessels demonstrate normal patency and bovine branching anatomy.   The heart size is normal. There is evidence of anomalous coronary artery anatomy which was present on prior scans but better delineated on the current study. Both the right and left coronary arteries appear to originate off of the right coronary cusp. The left  coronary artery course initially parallels the right then passes anterior to the pulmonary outflow tract and eventually towards the left ventricle. No pericardial fluid. The main pulmonary artery is dilated measuring up to 3.6 cm.   Mediastinum/Nodes: No enlarged mediastinal, hilar, or axillary lymph nodes. Thyroid gland, trachea, and esophagus demonstrate no significant findings.   Lungs/Pleura: Bilateral scattered small 2-3 mm pulmonary nodules, most of which are clearly calcified granulomata and mostly in a peripheral, subpleural distribution are stable since prior imaging. There is no evidence of pulmonary edema, consolidation, pneumothorax or pleural fluid.   Upper Abdomen: No acute abnormality.   Musculoskeletal: No chest wall abnormality. No acute or significant osseous findings.   Review of the MIP images confirms  the above findings.   IMPRESSION: 1. Stable mild aneurysmal dilatation of the ascending thoracic aorta measuring up to 4.2 cm. Recommend annual imaging followup by CTA or MRA. This recommendation follows 2010 ACCF/AHA/AATS/ACR/ASA/SCA/SCAI/SIR/STS/SVM Guidelines for the Diagnosis and Management of Patients with Thoracic Aortic Disease. Circulation. 2010; 121: Z733-z630. Aortic aneurysm NOS (ICD10-I71.9) 2. Evidence of anomalous coronary artery anatomy which was present on prior scans but better delineated on the current study. Both the right and left coronary arteries appear to originate off of the right coronary cusp. The left coronary artery course initially parallels the right then passes anterior to the pulmonary outflow tract and eventually towards the left ventricle. Anomalous coronary anatomy would likely be better depicted and delineated by gated coronary CTA. 3. Dilated main pulmonary artery measuring up to 3.6 cm. This is consistent with some degree of underlying pulmonary arterial hypertension. 4. Stable small bilateral pulmonary nodules, most of  which are clearly calcified granulomata and mostly in a peripheral, subpleural distribution. These are stable since 2021 and considered benign.     Electronically Signed   By: Marcey Moan M.D.   On: 06/10/2024 12:09     A/P: Aneurysm of ascending aorta without rupture -4.2 cm ascending thoracic aortic aneurysm on CT of chest.  Echocardiogram showed tricuspid aortic valve.  -We discussed the natural history and and risk factors for growth of ascending aortic aneurysms. Discussed recommendations to minimize the risk of further expansion or dissection including careful blood pressure control, avoidance of contact sports and heavy lifting, attention to lipid management.  We covered the importance of staying never user of tobacco.  The patient does not yet meet surgical criteria of >5.5cm. The patient is aware of signs and symptoms of aortic dissection and when to present to the emergency department   -Follow-up in 1 year with CT of chest for continued surveillance   Risk Modification:  Statin:  rosuvastatin   Smoking cessation instruction/counseling given: never user  Patient was counseled on importance of Blood Pressure Control  They are instructed to contact their Primary Care Physician if they start to have blood pressure readings over 130s/90s. Do not ever stop blood pressure medications on your own, unless instructed by healthcare professional.  Please avoid use of Fluoroquinolones as this can potentially increase your risk of Aortic Rupture and/or Dissection  Patient educated on signs and symptoms of Aortic Dissection, handout also provided in AVS  Manuelita CHRISTELLA Rough, PA-C 06/21/24

## 2024-06-21 NOTE — Patient Instructions (Signed)

## 2024-07-01 ENCOUNTER — Other Ambulatory Visit (HOSPITAL_COMMUNITY): Payer: Self-pay

## 2024-07-13 ENCOUNTER — Other Ambulatory Visit (HOSPITAL_COMMUNITY): Payer: Self-pay

## 2024-08-11 ENCOUNTER — Other Ambulatory Visit (HOSPITAL_COMMUNITY): Payer: Self-pay

## 2024-08-11 MED ORDER — LOSARTAN POTASSIUM 100 MG PO TABS
100.0000 mg | ORAL_TABLET | Freq: Every day | ORAL | 3 refills | Status: AC
  Filled 2024-08-11: qty 30, 30d supply, fill #0

## 2024-08-18 ENCOUNTER — Other Ambulatory Visit (HOSPITAL_COMMUNITY): Payer: Self-pay
